# Patient Record
Sex: Female | Born: 1970 | Race: White | Hispanic: No | Marital: Single | Smoking: Never smoker
Health system: Southern US, Community
[De-identification: ages and names within clinical notes are randomized; demographics above are authoritative.]

## PROBLEM LIST (undated history)

## (undated) DIAGNOSIS — T783XXA Angioneurotic edema, initial encounter: Secondary | ICD-10-CM

## (undated) DIAGNOSIS — J45909 Unspecified asthma, uncomplicated: Secondary | ICD-10-CM

## (undated) DIAGNOSIS — I10 Essential (primary) hypertension: Secondary | ICD-10-CM

## (undated) HISTORY — DX: Unspecified asthma, uncomplicated: J45.909

## (undated) HISTORY — DX: Angioneurotic edema, initial encounter: T78.3XXA

## (undated) HISTORY — PX: NO PAST SURGERIES: SHX2092

---

## 2015-01-09 ENCOUNTER — Emergency Department (HOSPITAL_BASED_OUTPATIENT_CLINIC_OR_DEPARTMENT_OTHER): Payer: BLUE CROSS/BLUE SHIELD

## 2015-01-09 ENCOUNTER — Emergency Department (HOSPITAL_BASED_OUTPATIENT_CLINIC_OR_DEPARTMENT_OTHER)
Admission: EM | Admit: 2015-01-09 | Discharge: 2015-01-09 | Disposition: A | Discharge status: Home / Self Care | Payer: BLUE CROSS/BLUE SHIELD | Attending: Emergency Medicine | Admitting: Emergency Medicine

## 2015-01-09 ENCOUNTER — Encounter (HOSPITAL_BASED_OUTPATIENT_CLINIC_OR_DEPARTMENT_OTHER): Payer: Self-pay | Admitting: Emergency Medicine

## 2015-01-09 DIAGNOSIS — R000 Tachycardia, unspecified: Secondary | ICD-10-CM | POA: Diagnosis not present

## 2015-01-09 DIAGNOSIS — N12 Tubulo-interstitial nephritis, not specified as acute or chronic: Secondary | ICD-10-CM | POA: Insufficient documentation

## 2015-01-09 DIAGNOSIS — Z79899 Other long term (current) drug therapy: Secondary | ICD-10-CM | POA: Insufficient documentation

## 2015-01-09 DIAGNOSIS — D649 Anemia, unspecified: Secondary | ICD-10-CM | POA: Insufficient documentation

## 2015-01-09 DIAGNOSIS — I10 Essential (primary) hypertension: Secondary | ICD-10-CM | POA: Insufficient documentation

## 2015-01-09 DIAGNOSIS — R109 Unspecified abdominal pain: Secondary | ICD-10-CM

## 2015-01-09 DIAGNOSIS — R1084 Generalized abdominal pain: Secondary | ICD-10-CM | POA: Diagnosis present

## 2015-01-09 HISTORY — DX: Essential (primary) hypertension: I10

## 2015-01-09 LAB — COMPREHENSIVE METABOLIC PANEL
ALT: 12 U/L — ABNORMAL LOW (ref 14–54)
AST: 17 U/L (ref 15–41)
Albumin: 3.6 g/dL (ref 3.5–5.0)
Alkaline Phosphatase: 61 U/L (ref 38–126)
Anion gap: 10 (ref 5–15)
BUN: 11 mg/dL (ref 6–20)
CO2: 24 mmol/L (ref 22–32)
Calcium: 8.8 mg/dL — ABNORMAL LOW (ref 8.9–10.3)
Chloride: 102 mmol/L (ref 101–111)
Creatinine, Ser: 0.8 mg/dL (ref 0.44–1.00)
GFR calc Af Amer: >60 mL/min (ref >60)
GFR calc non Af Amer: >60 mL/min (ref >60)
Glucose, Bld: 132 mg/dL — ABNORMAL HIGH (ref 65–99)
Potassium: 3.6 mmol/L (ref 3.5–5.1)
Sodium: 136 mmol/L (ref 135–145)
Total Bilirubin: 0.4 mg/dL (ref 0.3–1.2)
Total Protein: 6.8 g/dL (ref 6.5–8.1)

## 2015-01-09 LAB — DIFFERENTIAL
Basophils Absolute: 0 10*3/uL (ref 0.0–0.1)
Basophils Relative: 0 % (ref 0–1)
Eosinophils Absolute: 0 10*3/uL (ref 0.0–0.7)
Eosinophils Relative: 0 % (ref 0–5)
Lymphocytes Relative: 6 % — ABNORMAL LOW (ref 12–46)
Lymphs Abs: 1.4 10*3/uL (ref 0.7–4.0)
Monocytes Absolute: 2.7 10*3/uL — ABNORMAL HIGH (ref 0.1–1.0)
Monocytes Relative: 12 % (ref 3–12)
Neutro Abs: 19.3 10*3/uL — ABNORMAL HIGH (ref 1.7–7.7)
Neutrophils Relative %: 82 % — ABNORMAL HIGH (ref 43–77)

## 2015-01-09 LAB — CBC
HCT: 35.3 % — ABNORMAL LOW (ref 36.0–46.0)
Hemoglobin: 11.3 g/dL — ABNORMAL LOW (ref 12.0–15.0)
MCH: 26.8 pg (ref 26.0–34.0)
MCHC: 32 g/dL (ref 30.0–36.0)
MCV: 83.8 fL (ref 78.0–100.0)
Platelets: 314 10*3/uL (ref 150–400)
RBC: 4.21 MIL/uL (ref 3.87–5.11)
RDW: 14.6 % (ref 11.5–15.5)
WBC: 23.5 10*3/uL — ABNORMAL HIGH (ref 4.0–10.5)

## 2015-01-09 LAB — URINALYSIS, ROUTINE W REFLEX MICROSCOPIC
Bilirubin Urine: NEGATIVE
Glucose, UA: NEGATIVE mg/dL
Ketones, ur: 15 mg/dL — AB
Nitrite: NEGATIVE
Protein, ur: 30 mg/dL — AB
Specific Gravity, Urine: 1.016 (ref 1.005–1.030)
Urobilinogen, UA: 0.2 mg/dL (ref 0.0–1.0)
pH: 6.5 (ref 5.0–8.0)

## 2015-01-09 LAB — I-STAT CG4 LACTIC ACID, ED
Lactic Acid, Venous: 0.81 mmol/L (ref 0.5–2.0)
Lactic Acid, Venous: 0.92 mmol/L (ref 0.5–2.0)

## 2015-01-09 LAB — URINE MICROSCOPIC-ADD ON

## 2015-01-09 LAB — PREGNANCY, URINE: Preg Test, Ur: NEGATIVE

## 2015-01-09 LAB — LIPASE, BLOOD: Lipase: 17 U/L — ABNORMAL LOW (ref 22–51)

## 2015-01-09 MED ORDER — SODIUM CHLORIDE 0.9 % IV SOLN
1000.0000 mL | Freq: Once | INTRAVENOUS | Status: AC
  Administered 2015-01-09: 1000 mL via INTRAVENOUS

## 2015-01-09 MED ORDER — CEPHALEXIN 500 MG PO CAPS: 500.0000 mg | ORAL_CAPSULE | Freq: Four times a day (QID) | ORAL | Status: DC

## 2015-01-09 MED ORDER — IOHEXOL 300 MG/ML  SOLN
100.0000 mL | Freq: Once | INTRAMUSCULAR | Status: AC | PRN
  Administered 2015-01-09: 100 mL via INTRAVENOUS

## 2015-01-09 MED ORDER — SODIUM CHLORIDE 0.9 % IV SOLN
1000.0000 mL | INTRAVENOUS | Status: DC
  Administered 2015-01-09: 1000 mL via INTRAVENOUS

## 2015-01-09 MED ORDER — FLUCONAZOLE 150 MG PO TABS: 150.0000 mg | ORAL_TABLET | Freq: Once | ORAL | Status: DC

## 2015-01-09 MED ORDER — ONDANSETRON HCL 4 MG PO TABS: 4.0000 mg | ORAL_TABLET | Freq: Four times a day (QID) | ORAL | Status: DC | PRN

## 2015-01-09 MED ORDER — ONDANSETRON HCL 4 MG/2ML IJ SOLN
4.0000 mg | Freq: Once | INTRAMUSCULAR | Status: AC
  Administered 2015-01-09: 4 mg via INTRAVENOUS
  Filled 2015-01-09: qty 2

## 2015-01-09 MED ORDER — CEFTRIAXONE SODIUM 1 G IJ SOLR
INTRAMUSCULAR | Status: AC
  Filled 2015-01-09: qty 10

## 2015-01-09 MED ORDER — ACETAMINOPHEN 325 MG PO TABS
650.0000 mg | ORAL_TABLET | Freq: Once | ORAL | Status: AC
  Administered 2015-01-09: 650 mg via ORAL
  Filled 2015-01-09: qty 2

## 2015-01-09 MED ORDER — CEFTRIAXONE SODIUM 1 G IJ SOLR
1.0000 g | Freq: Once | INTRAMUSCULAR | Status: AC
  Administered 2015-01-09: 1 g via INTRAVENOUS

## 2015-01-09 MED ORDER — IOHEXOL 300 MG/ML  SOLN
50.0000 mL | Freq: Once | INTRAMUSCULAR | Status: AC | PRN
  Administered 2015-01-09: 50 mL via ORAL

## 2015-01-09 MED ORDER — HYDROMORPHONE HCL 1 MG/ML IJ SOLN
1.0000 mg | Freq: Once | INTRAMUSCULAR | Status: AC
  Administered 2015-01-09: 1 mg via INTRAVENOUS
  Filled 2015-01-09: qty 1

## 2015-01-09 NOTE — ED Notes (Signed)
MD at bedside to discuss results of testing and plan of care. 

## 2015-01-09 NOTE — ED Notes (Signed)
Pt states that 5 days ago her pain began to worse from normal back pain, now with lower abdomen, increased back pain, fever chills

## 2015-01-09 NOTE — ED Provider Notes (Signed)
CSN: [id number]     Arrival date & time 01/09/15  0505 History   First MD Initiated Contact with Patient 01/09/15 0531     Chief Complaint  Patient presents with  . Abdominal Pain     (Consider location/radiation/quality/duration/timing/severity/associated sxs/prior Treatment) Patient is a 44 y.o. female presenting with abdominal pain. The history is provided by the patient.  Abdominal Pain She [facility] and thought she had a UTI and treated herself with over-the-counter phenazopyridine with improvement of symptoms. About one week ago, she started having some abdominal bloating which has gotten worse. Her back pain got significantly worse tonight and she continues to have abdominal pain. Abdominal pain is generalized. There is associated nausea but no vomiting. She thought she was constipated and took a laxity of. Following this, she did have a bowel movement with no relief of her pain. She rates her pain at 9/10. She has had chills but no fever or sweats. She is also complaining of generalized body aches. She denies urinary urgency, frequency, tenesmus, dysuria. She is also complaining of a headache.  Past Medical History  Diagnosis Date  . Hypertension    History reviewed. No pertinent past surgical history. History reviewed. No pertinent family history. History  Substance Use Topics  . Smoking status: Never Smoker   . Smokeless tobacco: Not on file  . Alcohol Use: No   OB History    No data available     Review of Systems  Gastrointestinal: Positive for abdominal pain.  All other systems reviewed and are negative.     Allergies  Erythromycin and Sulfa antibiotics  Home Medications   Prior to Admission medications   Medication Sig Start Date End Date Taking? Authorizing Provider  diltiazem (TIAZAC) 240 MG 24 hr capsule Take 240 mg by mouth daily.   Yes Historical Provider, MD  furosemide (LASIX) 20 MG tablet Take 20 mg by mouth.   Yes Historical Provider, MD   HYDROcodone-acetaminophen (NORCO/VICODIN) 5-325 MG per tablet Take 3 tablets by mouth daily.   Yes Historical Provider, MD  levothyroxine (SYNTHROID, LEVOTHROID) 100 MCG tablet Take 100 mcg by mouth daily before breakfast.   Yes Historical Provider, MD  metoprolol tartrate (LOPRESSOR) 25 MG tablet Take 25 mg by mouth 2 (two) times daily.   Yes Historical Provider, MD  naloxegol oxalate (MOVANTIK) 25 MG TABS tablet Take by mouth daily.   Yes Historical Provider, MD   BP 131/85 mmHg  Pulse 124  Temp(Src) 101.7 F (38.7 C) (Oral)  Resp 20  Ht  (1.651 m)  Wt 155 lb (70.308 kg)  BMI 25.79 kg/m2  SpO2 100%  LMP 01/05/2015 Physical Exam  Nursing note and vitals reviewed.  44 year old female, resting comfortably and in no acute distress. Vital signs are significant for fever and tachycardia. Oxygen saturation is 100%, which is normal. Head is normocephalic and atraumatic. PERRLA, EOMI. Oropharynx is clear. Neck is nontender and supple without adenopathy or JVD. Back is nontender in the midline. There is moderate left CVA tenderness. Lungs are clear without rales, wheezes, or rhonchi. Chest is nontender. Heart has regular rate and rhythm without murmur. Abdomen is soft, flat, with mild tenderness diffusely. There are no masses or hepatosplenomegaly and peristalsis is slightly hypoactive. Extremities have no cyanosis or edema, full range of motion is present. Skin is warm and dry without rash. Neurologic: Mental status is normal, cranial nerves are intact, there are no motor or sensory deficits.  ED Course  Procedures (including critical  care time) Labs Review Results for orders placed or performed during the hospital encounter of 01/09/15  Urinalysis, Routine w reflex microscopic (not at [facility])  Result Value Ref Range   Color, Urine RED (A) YELLOW   APPearance CLOUDY (A) CLEAR   Specific Gravity, Urine 1.016 1.005 - 1.030   pH 6.5 5.0 - 8.0   Glucose, UA NEGATIVE NEGATIVE mg/dL    Hgb urine dipstick LARGE (A) NEGATIVE   Bilirubin Urine NEGATIVE NEGATIVE   Ketones, ur 15 (A) NEGATIVE mg/dL   Protein, ur 30 (A) NEGATIVE mg/dL   Urobilinogen, UA 0.2 0.0 - 1.0 mg/dL   Nitrite NEGATIVE NEGATIVE   Leukocytes, UA MODERATE (A) NEGATIVE  Comprehensive metabolic panel  Result Value Ref Range   Sodium 136 135 - 145 mmol/L   Potassium 3.6 3.5 - 5.1 mmol/L   Chloride 102 101 - 111 mmol/L   CO2 24 22 - 32 mmol/L   Glucose, Bld 132 (H) 65 - 99 mg/dL   BUN 11 6 - 20 mg/dL   Creatinine, Ser 1.61 0.44 - 1.00 mg/dL   Calcium 8.8 (L) 8.9 - 10.3 mg/dL   Total Protein 6.8 6.5 - 8.1 g/dL   Albumin 3.6 3.5 - 5.0 g/dL   AST 17 15 - 41 U/L   ALT 12 (L) 14 - 54 U/L   Alkaline Phosphatase 61 38 - 126 U/L   Total Bilirubin 0.4 0.3 - 1.2 mg/dL   GFR calc non Af Amer >60 >60 mL/min   GFR calc Af Amer >60 >60 mL/min   Anion gap 10 5 - 15  CBC  Result Value Ref Range   WBC 23.5 (H) 4.0 - 10.5 K/uL   RBC 4.21 3.87 - 5.11 MIL/uL   Hemoglobin 11.3 (L) 12.0 - 15.0 g/dL   HCT 09.6 (L) 04.5 - 40.9 %   MCV 83.8 78.0 - 100.0 fL   MCH 26.8 26.0 - 34.0 pg   MCHC 32.0 30.0 - 36.0 g/dL   RDW 81.1 91.4 - 78.2 %   Platelets 314 150 - 400 K/uL  Differential  Result Value Ref Range   Neutrophils Relative % 82 (H) 43 - 77 %   Neutro Abs 19.3 (H) 1.7 - 7.7 K/uL   Lymphocytes Relative 6 (L) 12 - 46 %   Lymphs Abs 1.4 0.7 - 4.0 K/uL   Monocytes Relative 12 3 - 12 %   Monocytes Absolute 2.7 (H) 0.1 - 1.0 K/uL   Eosinophils Relative 0 0 - 5 %   Eosinophils Absolute 0.0 0.0 - 0.7 K/uL   Basophils Relative 0 0 - 1 %   Basophils Absolute 0.0 0.0 - 0.1 K/uL  Urine microscopic-add on  Result Value Ref Range   Squamous Epithelial / LPF RARE RARE   WBC, UA 21-50 <3 WBC/hpf   RBC / HPF TOO NUMEROUS TO COUNT <3 RBC/hpf   Bacteria, UA RARE RARE  Lipase, blood  Result Value Ref Range   Lipase 17 (L) 22 - 51 U/L  Pregnancy, urine  Result Value Ref Range   Preg Test, Ur NEGATIVE NEGATIVE  I-Stat  CG4 Lactic Acid, ED  Result Value Ref Range   Lactic Acid, Venous 0.81 0.5 - 2.0 mmol/L  I-Stat CG4 Lactic Acid, ED  Result Value Ref Range   Lactic Acid, Venous 0.92 0.5 - 2.0 mmol/L   Imaging Review Ct Abdomen Pelvis W Contrast  01/09/2015   CLINICAL DATA:  Lower abdominal pain for 5 days. Fever and chills. Back  pain.  EXAM: CT ABDOMEN AND PELVIS WITH CONTRAST  TECHNIQUE: Multidetector CT imaging of the abdomen and pelvis was performed using the standard protocol following bolus administration of intravenous contrast.  CONTRAST:  50mL OMNIPAQUE IOHEXOL 300 MG/ML SOLN, OMNIPAQUE IOHEXOL 300 MG/ML SOLN  COMPARISON:  None.  FINDINGS: Lower chest:  The included lung bases are clear.  Liver: No focal lesion.  Hepatobiliary: Gallbladder physiologically distended. No biliary dilatation.  Pancreas: Normal.  Spleen: Normal.  Adrenal glands: No nodule.  Kidneys: Enlargement and heterogeneous enhancement of the left kidney with proximal ureteral enhancement. Moderate perinephric stranding. No obstructing stone. Heterogeneous density in the left upper pole. Homogeneous enhancement of the right kidney. No focal lesion.  Stomach/Bowel: Stomach physiologically distended. There are no dilated or thickened small bowel loops. Moderate volume of stool throughout the colon without colonic wall thickening. The appendix is normal.  Vascular/Lymphatic: No retroperitoneal adenopathy. Abdominal aorta is normal in caliber.  Reproductive: Uterus is normal for age. Ovaries not discretely visualized. Small amount of free fluid in the cul-de-sac.  Bladder: Physiologically distended.  Other: No free air or intra-abdominal fluid collection. Small fat containing umbilical hernia.  Musculoskeletal: There are no acute or suspicious osseous abnormalities.  IMPRESSION: 1. Left pyelonephritis. 2. Small amount of free fluid in the pelvis, likely reactive.   Electronically Signed   By: Rubye Oaks M.D.   On: 01/09/2015 07:06    Images viewed by me.  MDM   Final diagnoses:  Abdominal pain, unspecified abdominal location  Pyelonephritis  Normochromic normocytic anemia    Fever, bodyaches, tachycardia with associated abdominal pain. This may represent a urinary tract infection with right is. However, with generalized abdominal pain and bloating, will send for CT to rule out other pathology. In the meantime, she is given IV fluids and hydromorphone for pain and ondansetron for nausea. She has no past records and the [facility] system.  WBC is come back markedly elevated at 23.5. Lactic acid level is normal. CT shows evidence of pyelonephritis. Curiously, urine shows mainly hematuria with relatively small number of WBCs and bacteria. Urine is sent for culture and she is started on ceftriaxone. Because of severe leukocytosis, admission was recommended. However, patient stated she would prefer to try treatment at home. She is not overtly toxic and not hypotensive and not vomiting, so initial outpatient management is not unreasonable. She is discharged with prescriptions for cephalexin and ondansetron but advised to return to the ED should she not be responding reasonably well at home.  Dione Booze, MD 01/09/15 [phone]

## 2015-01-09 NOTE — ED Notes (Signed)
MD back at bedside to discuss discharge versus admit with pt.

## 2015-01-09 NOTE — ED Notes (Signed)
Patient transported to CT 

## 2015-01-09 NOTE — Discharge Instructions (Signed)
Return to emergency department if you're having any difficulties. Specifically, if you start running a high fever, have vomiting which is not controlled with ondansetron, or pain is not being adequately controlled at home. You do have a serious kidney infection which sometimes needs treatment in the hospital.  Pyelonephritis, Adult Pyelonephritis is a kidney infection. In general, there are 2 main types of pyelonephritis:  Infections that come on quickly without any warning (acute pyelonephritis).  Infections that persist for a long period of time (chronic pyelonephritis). CAUSES  Two main causes of pyelonephritis are:  Bacteria traveling from the bladder to the kidney. This is a problem especially in pregnant women. The urine in the bladder can become filled with bacteria from multiple causes, including:  Inflammation of the prostate gland (prostatitis).  Sexual intercourse in females.  Bladder infection (cystitis).  Bacteria traveling from the bloodstream to the tissue part of the kidney. Problems that may increase your risk of getting a kidney infection include:  Diabetes.  Kidney stones or bladder stones.  Cancer.  Catheters placed in the bladder.  Other abnormalities of the kidney or ureter. SYMPTOMS   Abdominal pain.  Pain in the side or flank area.  Fever.  Chills.  Upset stomach.  Blood in the urine (dark urine).  Frequent urination.  Strong or persistent urge to urinate.  Burning or stinging when urinating. DIAGNOSIS  Your caregiver may diagnose your kidney infection based on your symptoms. A urine sample may also be taken. TREATMENT  In general, treatment depends on how severe the infection is.   If the infection is mild and caught early, your caregiver may treat you with oral antibiotics and send you home.  If the infection is more severe, the bacteria may have gotten into the bloodstream. This will require intravenous (IV) antibiotics and a  hospital stay. Symptoms may include:  High fever.  Severe flank pain.  Shaking chills.  Even after a hospital stay, your caregiver may require you to be on oral antibiotics for a period of time.  Other treatments may be required depending upon the cause of the infection. HOME CARE INSTRUCTIONS   Take your antibiotics as directed. Finish them even if you start to feel better.  Make an appointment to have your urine checked to make sure the infection is gone.  Drink enough fluids to keep your urine clear or pale yellow.  Take medicines for the bladder if you have urgency and frequency of urination as directed by your caregiver. SEEK IMMEDIATE MEDICAL CARE IF:   You have a fever or persistent symptoms for more than 2-3 days.  You have a fever and your symptoms suddenly get worse.  You are unable to take your antibiotics or fluids.  You develop shaking chills.  You experience extreme weakness or fainting.  There is no improvement after 2 days of treatment. MAKE SURE YOU:  Understand these instructions.  Will watch your condition.  Will get help right away if you are not doing well or get worse. Document Released: 05/29/2005 Document Revised: 11/28/2011 Document Reviewed: 11/02/2010 [facility] Patient Information 2015 [place], [place]. This information is not intended to replace advice given to you by your health care provider. Make sure you discuss any questions you have with your health care provider.  Cephalexin tablets or capsules What is this medicine? CEPHALEXIN (sef a LEX in) is a cephalosporin antibiotic. It is used to treat certain kinds of bacterial infections It will not work for colds, flu, or other viral infections.  This medicine may be used for other purposes; ask your health care provider or pharmacist if you have questions. COMMON BRAND NAME(S): Biocef, Keflex, Keftab What should I tell my health care provider before I take this medicine? They need to know if  you have any of these conditions: -kidney disease -stomach or intestine problems, especially colitis -an unusual or allergic reaction to cephalexin, other cephalosporins, penicillins, other antibiotics, medicines, foods, dyes or preservatives -pregnant or trying to get pregnant -breast-feeding How should I use this medicine? Take this medicine by mouth with a full glass of water. Follow the directions on the prescription label. This medicine can be taken with or without food. Take your medicine at regular intervals. Do not take your medicine more often than directed. Take all of your medicine as directed even if you think you are better. Do not skip doses or stop your medicine early. Talk to your pediatrician regarding the use of this medicine in children. While this drug may be prescribed for selected conditions, precautions do apply. Overdosage: If you think you have taken too much of this medicine contact a poison control center or emergency room at once. NOTE: This medicine is only for you. Do not share this medicine with others. What if I miss a dose? If you miss a dose, take it as soon as you can. If it is almost time for your next dose, take only that dose. Do not take double or extra doses. There should be at least 4 to 6 hours between doses. What may interact with this medicine? -probenecid -some other antibiotics This list may not describe all possible interactions. Give your health care provider a list of all the medicines, herbs, non-prescription drugs, or dietary supplements you use. Also tell them if you smoke, drink alcohol, or use illegal drugs. Some items may interact with your medicine. What should I watch for while using this medicine? Tell your doctor or health care professional if your symptoms do not begin to improve in a few days. Do not treat diarrhea with over the counter products. Contact your doctor if you have diarrhea that lasts more than 2 days or if it is severe and  watery. If you have diabetes, you may get a false-positive result for sugar in your urine. Check with your doctor or health care professional. What side effects may I notice from receiving this medicine? Side effects that you should report to your doctor or health care professional as soon as possible: -allergic reactions like skin rash, itching or hives, swelling of the face, lips, or tongue -breathing problems -pain or trouble passing urine -redness, blistering, peeling or loosening of the skin, including inside the mouth -severe or watery diarrhea -unusually weak or tired -yellowing of the eyes, skin Side effects that usually do not require medical attention (report to your doctor or health care professional if they continue or are bothersome): -gas or heartburn -genital or anal irritation -headache -joint or muscle pain -nausea, vomiting This list may not describe all possible side effects. Call your doctor for medical advice about side effects. You may report side effects to FDA at 1-800-FDA-1088. Where should I keep my medicine? Keep out of the reach of children. Store at room temperature between 59 and 86 degrees F (15 and 30 degrees C). Throw away any unused medicine after the expiration date. NOTE: This sheet is a summary. It may not cover all possible information. If you have questions about this medicine, talk to your doctor, pharmacist, or  health care provider.  2015, Elsevier/Gold Standard. (2007-09-02 17:09:13)  Ondansetron tablets What is this medicine? ONDANSETRON (on DAN se tron) is used to treat nausea and vomiting caused by chemotherapy. It is also used to prevent or treat nausea and vomiting after surgery. This medicine may be used for other purposes; ask your health care provider or pharmacist if you have questions. COMMON BRAND NAME(S): Zofran What should I tell my health care provider before I take this medicine? They need to know if you have any of these  conditions: -heart disease -history of irregular heartbeat -liver disease -low levels of magnesium or potassium in the blood -an unusual or allergic reaction to ondansetron, granisetron, other medicines, foods, dyes, or preservatives -pregnant or trying to get pregnant -breast-feeding How should I use this medicine? Take this medicine by mouth with a glass of water. Follow the directions on your prescription label. Take your doses at regular intervals. Do not take your medicine more often than directed. Talk to your pediatrician regarding the use of this medicine in children. Special care may be needed. Overdosage: If you think you have taken too much of this medicine contact a poison control center or emergency room at once. NOTE: This medicine is only for you. Do not share this medicine with others. What if I miss a dose? If you miss a dose, take it as soon as you can. If it is almost time for your next dose, take only that dose. Do not take double or extra doses. What may interact with this medicine? Do not take this medicine with any of the following medications: -apomorphine -certain medicines for fungal infections like fluconazole, itraconazole, ketoconazole, posaconazole, voriconazole -cisapride -dofetilide -dronedarone -pimozide -thioridazine -ziprasidone This medicine may also interact with the following medications: -carbamazepine -certain medicines for depression, anxiety, or psychotic disturbances -fentanyl -linezolid -MAOIs like Carbex, Eldepryl, Marplan, Nardil, and Parnate -methylene blue (injected into a vein) -other medicines that prolong the QT interval (cause an abnormal heart rhythm) -phenytoin -rifampicin -tramadol This list may not describe all possible interactions. Give your health care provider a list of all the medicines, herbs, non-prescription drugs, or dietary supplements you use. Also tell them if you smoke, drink alcohol, or use illegal drugs. Some  items may interact with your medicine. What should I watch for while using this medicine? Check with your doctor or health care professional right away if you have any sign of an allergic reaction. What side effects may I notice from receiving this medicine? Side effects that you should report to your doctor or health care professional as soon as possible: -allergic reactions like skin rash, itching or hives, swelling of the face, lips or tongue -breathing problems -confusion -dizziness -fast or irregular heartbeat -feeling faint or lightheaded, falls -fever and chills -loss of balance or coordination -seizures -sweating -swelling of the hands or feet -tightness in the chest -tremors -unusually weak or tired Side effects that usually do not require medical attention (report to your doctor or health care professional if they continue or are bothersome): -constipation or diarrhea -headache This list may not describe all possible side effects. Call your doctor for medical advice about side effects. You may report side effects to FDA at 1-800-FDA-1088. Where should I keep my medicine? Keep out of the reach of children. Store between 2 and 30 degrees C (36 and 86 degrees F). Throw away any unused medicine after the expiration date. NOTE: This sheet is a summary. It may not cover all possible information. If  you have questions about this medicine, talk to your doctor, pharmacist, or health care provider.  2015, Elsevier/Gold Standard. (2013-03-05 16:27:45)

## 2015-01-12 LAB — URINE CULTURE: Culture: >=100000

## 2015-01-13 ENCOUNTER — Telehealth (HOSPITAL_COMMUNITY): Payer: Self-pay

## 2015-01-13 NOTE — Telephone Encounter (Signed)
Post ED Visit - Positive Culture Follow-up  Culture report reviewed by antimicrobial stewardship pharmacist:  Wes Dulaney, Pharm.D., BCPS  Celedonio Miyamoto, Pharm.D., BCPS  Georgina Pillion, [address].D., BCPS  [place], [place].D., BCPS, AAHIVP  Estella Husk, Pharm.D., BCPS, AAHIVP  Elder Cyphers, [address].D., BCPS X  [facility]  Positive urine culture Treated with cephalexin, organism sensitive to the same and no further patient follow-up is required at this time.  Ashley Jacobs 01/13/2015, 8:42 AM

## 2018-08-30 ENCOUNTER — Other Ambulatory Visit: Payer: Self-pay

## 2018-08-30 ENCOUNTER — Ambulatory Visit (INDEPENDENT_AMBULATORY_CARE_PROVIDER_SITE_OTHER): Payer: BLUE CROSS/BLUE SHIELD | Admitting: Allergy

## 2018-08-30 ENCOUNTER — Encounter: Payer: Self-pay | Admitting: Allergy

## 2018-08-30 VITALS — BP 100/62 | HR 92 | Temp 98.4°F | Resp 14 | Ht 64.5 in | Wt 169.0 lb

## 2018-08-30 DIAGNOSIS — J3089 Other allergic rhinitis: Secondary | ICD-10-CM | POA: Diagnosis not present

## 2018-08-30 DIAGNOSIS — J4530 Mild persistent asthma, uncomplicated: Secondary | ICD-10-CM

## 2018-08-30 DIAGNOSIS — J302 Other seasonal allergic rhinitis: Secondary | ICD-10-CM | POA: Insufficient documentation

## 2018-08-30 DIAGNOSIS — T50995D Adverse effect of other drugs, medicaments and biological substances, subsequent encounter: Secondary | ICD-10-CM | POA: Insufficient documentation

## 2018-08-30 DIAGNOSIS — J4540 Moderate persistent asthma, uncomplicated: Secondary | ICD-10-CM | POA: Insufficient documentation

## 2018-08-30 DIAGNOSIS — T50905D Adverse effect of unspecified drugs, medicaments and biological substances, subsequent encounter: Secondary | ICD-10-CM

## 2018-08-30 DIAGNOSIS — T50905A Adverse effect of unspecified drugs, medicaments and biological substances, initial encounter: Secondary | ICD-10-CM | POA: Insufficient documentation

## 2018-08-30 MED ORDER — AZELASTINE-FLUTICASONE 137-50 MCG/ACT NA SUSP: 1.0000 | Freq: Two times a day (BID) | NASAL | 5 refills | Status: DC

## 2018-08-30 MED ORDER — EPINEPHRINE 0.3 MG/0.3ML IJ SOAJ: 0.3000 mg | INTRAMUSCULAR | 1 refills | Status: DC | PRN

## 2018-08-30 MED ORDER — BUDESONIDE-FORMOTEROL FUMARATE 80-4.5 MCG/ACT IN AERO: 2.0000 | INHALATION_SPRAY | Freq: Two times a day (BID) | RESPIRATORY_TRACT | 5 refills | Status: DC

## 2018-08-30 NOTE — Assessment & Plan Note (Signed)
Diagnosed with asthma over 10 years ago. Currently having a flare and using Qvar and albuterol daily with minimal benefit. Lungs clear on exam. . Daily controller medication(s): Start Symbicort 80 2 puffs twice a day with spacer and rinse mouth afterwards for the next month. Demonstrated proper use.  . Prior to physical activity: May use albuterol rescue inhaler 2 puffs 5 to 15 minutes prior to strenuous physical activities. Marland Kitchen Rescue medications: May use albuterol rescue inhaler 2 puffs or nebulizer every 4 to 6 hours as needed for shortness of breath, chest tightness, coughing, and wheezing. Monitor frequency of use.  . During upper respiratory infections: Start Symbicort 80 2 puffs twice a day with spacer and rinse mouth afterwards for 1-2 weeks. . Get spirometry at next visit.

## 2018-08-30 NOTE — Patient Instructions (Addendum)
Get your allergy injection this week and your vials from [place] and bring it to our office. I will review your records and order the allergy shots accordingly.  Had a detailed discussion with patient/family that clinical history is suggestive of allergic rhinitis, and may benefit from allergy immunotherapy (AIT). Discussed in detail regarding the dosing, schedule, side effects (mild to moderate local allergic reaction and rarely systemic allergic reactions including anaphylaxis), and benefits (significant improvement in nasal symptoms, seasonal flares of asthma) of immunotherapy with the patient. There is significant time commitment involved with allergy shots and we will have to rebuild weekly for a few months.   Continue environmental control measures for trees, weed, dust mites, dog and ragweed.   Start Dymista 1 spray twice a day. This replaces all your nasal sprays. Let me know if it's too expensive. Sample given.  Nasal saline spray (i.e., Simply Saline) or nasal saline lavage (i.e., NeilMed) is recommended as needed and prior to medicated nasal sprays.  Continue Singulair 10mg  daily. May use over the counter antihistamines such as Zyrtec (cetirizine), Claritin (loratadine), Allegra (fexofenadine), or Xyzal (levocetirizine) daily as needed.  Asthma: . Daily controller medication(s): Start Symbicort 80 2 puffs twice a day with spacer and rinse mouth afterwards for the next month.  . Prior to physical activity: May use albuterol rescue inhaler 2 puffs 5 to 15 minutes prior to strenuous physical activities. Marland Kitchen Rescue medications: May use albuterol rescue inhaler 2 puffs or nebulizer every 4 to 6 hours as needed for shortness of breath, chest tightness, coughing, and wheezing. Monitor frequency of use.  . During upper respiratory infections: Start Symbicort 80 2 puffs twice a day with spacer and rinse mouth afterwards for 1-2 weeks.  . Asthma control goals:  o Full participation in all desired  activities (may need albuterol before activity) o Albuterol use two times or less a week on average (not counting use with activity) o Cough interfering with sleep two times or less a month o Oral steroids no more than once a year o No hospitalizations  Continue to avoid drugs that bother you - Avelox, sulfa and erythromycin  Follow up in 2 months.   Reducing Pollen Exposure . Pollen seasons: trees (spring), grass (summer) and ragweed/weeds (fall). Marland Kitchen Keep windows closed in your home and car to lower pollen exposure.  Lilian Kapur air conditioning in the bedroom and throughout the house if possible.  . Avoid going out in dry windy days - especially early morning. . Pollen counts are highest between 5 - 10 AM and on dry, hot and windy days.  . Save outside activities for late afternoon or after a heavy rain, when pollen levels are lower.  . Avoid mowing of grass if you have grass pollen allergy. Marland Kitchen Be aware that pollen can also be transported indoors on people and pets.  . Dry your clothes in an automatic dryer rather than hanging them outside where they might collect pollen.  . Rinse hair and eyes before bedtime. Control of House Dust Mite Allergen . Dust mite allergens are a common trigger of allergy and asthma symptoms. While they can be found throughout the house, these microscopic creatures thrive in warm, humid environments such as bedding, upholstered furniture and carpeting. . Because so much time is spent in the bedroom, it is essential to reduce mite levels there.  . Encase pillows, mattresses, and box springs in special allergen-proof fabric covers or airtight, zippered plastic covers.  Cephus Shelling should be washed weekly  in hot water (130 F) and dried in a hot dryer. Allergen-proof covers are available for comforters and pillows that can't be regularly washed.  Reyes Ivan the allergy-proof covers every few months. Minimize clutter in the bedroom. Keep pets out of the bedroom.  Marland Kitchen Keep  humidity less than 50% by using a dehumidifier or air conditioning. You can buy a humidity measuring device called a hygrometer to monitor this.  . If possible, replace carpets with hardwood, linoleum, or washable area rugs. If that's not possible, vacuum frequently with a vacuum that has a HEPA filter. . Remove all upholstered furniture and non-washable window drapes from the bedroom. . Remove all non-washable stuffed toys from the bedroom.  Wash stuffed toys weekly. Pet Allergen Avoidance: . Contrary to popular opinion, there are no "hypoallergenic" breeds of dogs or cats. That is because people are not allergic to an animal's hair, but to an allergen found in the animal's saliva, dander (dead skin flakes) or urine. Pet allergy symptoms typically occur within minutes. For some people, symptoms can build up and become most severe 8 to 12 hours after contact with the animal. People with severe allergies can experience reactions in public places if dander has been transported on the pet owners' clothing. Marland Kitchen Keeping an animal outdoors is only a partial solution, since homes with pets in the yard still have higher concentrations of animal allergens. . Before getting a pet, ask your allergist to determine if you are allergic to animals. If your pet is already considered part of your family, try to minimize contact and keep the pet out of the bedroom and other rooms where you spend a great deal of time. . As with dust mites, vacuum carpets often or replace carpet with a hardwood floor, tile or linoleum. . High-efficiency particulate air (HEPA) cleaners can reduce allergen levels over time. . While dander and saliva are the source of cat and dog allergens, urine is the source of allergens from rabbits, hamsters, mice and [place] pigs; so ask a non-allergic family member to clean the animal's cage. . If you have a pet allergy, talk to your allergist about the potential for allergy immunotherapy (allergy shots).  This strategy can often provide long-term relief.

## 2018-08-30 NOTE — Progress Notes (Signed)
New Patient Note  RE: Bonnie Lowe MRN: [id number] DOB: 04/03/1971 Date of Office Visit: 08/30/2018  Referring provider: Evangeline GulaPlunk, Tiffany Quinn, F* Primary care provider: Hezzie BumpPlunk, Tiffany Quinn, Lowe  Chief Complaint: Allergies (transferring vials from [place] allergist. she is on maint already. She does have some post nasal drainage which has caused her to have a bit of a cough) and Asthma (she does have asthma, it is normally pretty well controlled. )  History of Present Illness: I had the pleasure of seeing Bonnie Lowe for initial evaluation at the [facility] on 08/30/2018. She is a 48 y.o. female, who is self-referred here for the evaluation of re-establishing care of the allergist.   Patient currently sees an allergist in [place] (Bonnie Lowe) and is on allergy immunotherapy for 10 years with no reactions in the past. Currently on AIT every 2-3 weeks and last injection was on 08/19/2018. [place] and it would be more convenient for her to receive injections in [place].   She reports symptoms of frequent sinus infections, PND, itchy/watery eyes which significantly improved with allergy shots. Symptoms have been going on for 40+ years. The symptoms are present all year around with worsening in December. Other triggers include exposure to dust. Anosmia: no. She has used Singulair, ipratropium 0.03% and Flonase and with some improvement in symptoms. Tried Astelin in the past but stopped due to bad taste. Sinus infections: once a year. Previous work up includes: skin testing in 2109 was positive to multiple items. Currently on AIT - trees, weed, dust mites, dog and ragweed. She had new vials made in July 2019. Previous ENT evaluation: not recently.  Asthma: She reports symptoms of chest tightness, shortness of breath, coughing, wheezing for 10 years and now having a flare due to dust exposure. Current medications include Qvar and albuterol prn which helps. She  tried the following inhalers: Advair. Main asthma triggers are allergies, infections. In the last month, frequency of asthma symptoms: last 2 weeks has been using it daily. Frequency of nocturnal symptoms: 0x/month. Frequency of SABA use: daily the last 2 weeks.  In the last 12 months, emergency room visits/urgent care visits/doctor office visits or hospitalizations due to asthma: 0. In the last 12 months, oral steroids courses: yes for bronchitis. Lifetime history of hospitalization for asthma: no. Prior intubations: no. History of pneumonia: no. She was evaluated by allergist in the past. Smoking exposure: no. Up to date with flu vaccine: yes.   Denies any fevers or chills. She does travel a lot for her work but she has not been traveling much since January. She did go to [place] about 2 weeks ago.    Assessment and Plan: Bonnie BraunKaren is a 48 y.o. female with: Mild persistent asthma without complication Diagnosed with asthma over 10 years ago. Currently having a flare and using Qvar and albuterol daily with minimal benefit. Lungs clear on exam. . Daily controller medication(s): Start Symbicort 80 2 puffs twice a day with spacer and rinse mouth afterwards for the next month. Demonstrated proper use.  . Prior to physical activity: May use albuterol rescue inhaler 2 puffs 5 to 15 minutes prior to strenuous physical activities. Marland Kitchen. Rescue medications: May use albuterol rescue inhaler 2 puffs or nebulizer every 4 to 6 hours as needed for shortness of breath, chest tightness, coughing, and wheezing. Monitor frequency of use.  . During upper respiratory infections: Start Symbicort 80 2 puffs twice a day with spacer and rinse  mouth afterwards for 1-2 weeks. . Get spirometry at next visit.   Seasonal and perennial allergic rhinitis Perennial rhino conjunctivitis symptoms for the past 40+ years but much improved since started AIT 10 years ago. No systemic reactions in the past. 2019 skin testing was positive to multiple  items. New vials made in 2019 - Dm-D-Rw + W-/t  Get your allergy injection this week and your vials from [place] and bring it to our office.  I will review your records and order the allergy shots accordingly.  Had a detailed discussion with patient/family that clinical history is suggestive of allergic rhinitis, and may benefit from allergy immunotherapy (AIT). Discussed in detail regarding the dosing, schedule, side effects (mild to moderate local allergic reaction and rarely systemic allergic reactions including anaphylaxis), and benefits (significant improvement in nasal symptoms, seasonal flares of asthma) of immunotherapy with the patient. There is significant time commitment involved with allergy shots and we will have to rebuild weekly for a few months. Consent was signed.  I have prescribed epinephrine injectable and demonstrated proper use. For mild symptoms you can take over the counter antihistamines such as Benadryl and monitor symptoms closely. If symptoms worsen or if you have severe symptoms including breathing issues, throat closure, significant swelling, whole body hives, severe diarrhea and vomiting, lightheadedness then inject epinephrine and seek immediate medical care afterwards.  Continue environmental control measures for trees, weed, dust mites, dog and ragweed.   Start Dymista 1 spray twice a day. This replaces all your nasal sprays. Let me know if it's too expensive. Sample given.   Nasal saline spray (i.e., Simply Saline) or nasal saline lavage (i.e., NeilMed) is recommended as needed and prior to medicated nasal sprays.  Continue Singulair  daily.  May use over the counter antihistamines such as Zyrtec (cetirizine), Claritin (loratadine), Allegra (fexofenadine), or Xyzal (levocetirizine) daily as needed.  Drug reaction Reactions to Avalox, sulfa and erythromycin in the past in the form of pruritus.  Continue to avoid above medications for now.    Return in about  2 months (around 10/30/2018).  Meds ordered this encounter  Medications  . EPINEPHrine (AUVI-Q) 0.3 mg/0.3 mL IJ SOAJ injection    Sig: Inject 0.3 mLs (0.3 mg total) into the muscle as needed for anaphylaxis.    Dispense:  2 Device    Refill:  1  . Azelastine-Fluticasone (DYMISTA) 137-50 MCG/ACT SUSP    Sig: Place 1 spray into both nostrils 2 (two) times daily.    Dispense:  1 Bottle    Refill:  5    RXBIN [id number] RXPCN [employer] RXGRP [id number] ISSUER [id number] ID [id number]  . budesonide-formoterol (SYMBICORT) 80-4.5 MCG/ACT inhaler    Sig: Inhale 2 puffs into the lungs 2 (two) times daily.    Dispense:  1 Inhaler    Refill:  5    BIN: [id number] PCN: CN GRP# [id number] ID# [id number]   Other allergy screening: Asthma: yes Rhino conjunctivitis: yes Food allergy: no Medication allergy: yes  Avelox - itching Erythromycin - itching Sulfa - itching  Hymenoptera allergy: no Urticaria: no Eczema:no History of recurrent infections suggestive of immunodeficency: no  Diagnostics: None.  Past Medical History: Patient Active Problem List   Diagnosis Date Noted  . Seasonal and perennial allergic rhinitis 08/30/2018  . Mild persistent asthma without complication 08/30/2018  . Drug reaction 08/30/2018   Past Medical History:  Diagnosis Date  . Angio-edema   . Asthma   . Hypertension    Past Surgical History: Past Surgical  History:  Procedure Laterality Date  . NO PAST SURGERIES     Medication List:  Current Outpatient Medications  Medication Sig Dispense Refill  . albuterol (PROVENTIL HFA;VENTOLIN HFA) 108 (90 Base) MCG/ACT inhaler Inhale 2 puffs into the lungs every 4 (four) hours as needed.    . cetirizine (ZYRTEC) 10 MG tablet Take 10 mg by mouth daily.    . Cholecalciferol (QC VITAMIN D3) 25 MCG (1000 UT) tablet Take 1,000 Units by mouth daily.    Marland Kitchen ibuprofen (ADVIL,MOTRIN) 800 MG tablet Take 800 mg by mouth 3 (three) times daily as needed.    Marland Kitchen levothyroxine (SYNTHROID) 75  MCG tablet Take 75 mcg by mouth daily before breakfast.    . liothyronine (CYTOMEL) 5 MCG tablet Take 5 mcg by mouth daily.    Marland Kitchen losartan (COZAAR) 100 MG tablet Take 100 mg by mouth daily.    Marland Kitchen MAGNESIUM GLYCINATE PLUS PO Take 400 mg by mouth daily.    . montelukast (SINGULAIR) 10 MG tablet Take 10 mg by mouth at bedtime.    Marland Kitchen Phentermine-Topiramate (QSYMIA) 7.5-46 MG CP24 Take 1 tablet by mouth daily.    . pimecrolimus (ELIDEL) 1 % cream Apply 1 application topically 2 (two) times daily.    Marland Kitchen spironolactone (ALDACTONE) 25 MG tablet Take 25 mg by mouth daily.    . vitamin k 100 MCG tablet Take 100 mcg by mouth daily.    . Azelastine-Fluticasone (DYMISTA) 137-50 MCG/ACT SUSP Place 1 spray into both nostrils 2 (two) times daily. 1 Bottle 5  . budesonide-formoterol (SYMBICORT) 80-4.5 MCG/ACT inhaler Inhale 2 puffs into the lungs 2 (two) times daily. 1 Inhaler 5  . EPINEPHrine (AUVI-Q) 0.3 mg/0.3 mL IJ SOAJ injection Inject 0.3 mLs (0.3 mg total) into the muscle as needed for anaphylaxis. 2 Device 1   No current facility-administered medications for this visit.    Allergies: Allergies  Allergen Reactions  . Moxifloxacin Hcl Itching  . Citrus Diarrhea  . Erythromycin   . Sulfa Antibiotics    Social History: Social History   Socioeconomic History  . Marital status: Single    Spouse name: Not on file  . Number of children: Not on file  . Years of education: Not on file  . Highest education level: Not on file  Occupational History  . Not on file  Social Needs  . Financial resource strain: Not on file  . Food insecurity:    Worry: Not on file    Inability: Not on file  . Transportation needs:    Medical: Not on file    Non-medical: Not on file  Tobacco Use  . Smoking status: Never Smoker  . Smokeless tobacco: Never Used  Substance and Sexual Activity  . Alcohol use: No  . Drug use: Never  . Sexual activity: Not on file  Lifestyle  . Physical activity:    Days per week: Not  on file    Minutes per session: Not on file  . Stress: Not on file  Relationships  . Social connections:    Talks on phone: Not on file    Gets together: Not on file    Attends religious service: Not on file    Active member of club or organization: Not on file    Attends meetings of clubs or organizations: Not on file    Relationship status: Not on file  Other Topics Concern  . Not on file  Social History Narrative  . Not on file   Lives  in a 51 year old home. Smoking: denies Occupation: Transport planner HistorySurveyor, minerals in the house: no Carpet in the family room: no Carpet in the bedroom: no Heating: electric Cooling: central Pet: no  Family History: Family History  Problem Relation Age of Onset  . Allergic rhinitis Mother   . Angioedema Neg Hx   . Asthma Neg Hx   . Atopy Neg Hx   . Immunodeficiency Neg Hx   . Urticaria Neg Hx   . Eczema Neg Hx    Review of Systems  Constitutional: Negative for appetite change, chills, fever and unexpected weight change.  HENT: Positive for congestion. Negative for rhinorrhea.   Eyes: Negative for itching.  Respiratory: Positive for cough, chest tightness, shortness of breath and wheezing.   Cardiovascular: Negative for chest pain.  Gastrointestinal: Negative for abdominal pain.  Genitourinary: Negative for difficulty urinating.  Skin: Negative for rash.  Allergic/Immunologic: Positive for environmental allergies. Negative for food allergies.  Neurological: Negative for headaches.   Objective: BP 100/62 (BP Location: Left Arm, Patient Position: Sitting, Cuff Size: Normal)   Pulse 92   Temp 98.4 F (36.9 C) (Oral)   Resp 14   Ht 5' 4.5" (1.638 m)   Wt 169 lb (76.7 kg)   SpO2 98%   BMI 28.56 kg/m  Body mass index is 28.56 kg/m. Physical Exam  Constitutional: She is oriented to person, place, and time. She appears well-developed and well-nourished.  HENT:  Head: Normocephalic and atraumatic.  Right  Ear: External ear normal.  Left Ear: External ear normal.  Nose: Nose normal.  Mouth/Throat: Oropharynx is clear and moist.  Eyes: Conjunctivae and EOM are normal.  Neck: Neck supple.  Cardiovascular: Normal rate, regular rhythm and normal heart sounds. Exam reveals no gallop and no friction rub.  No murmur heard. Pulmonary/Chest: Effort normal and breath sounds normal. She has no wheezes. She has no rales.  Abdominal: Soft.  Neurological: She is alert and oriented to person, place, and time.  Skin: Skin is warm. No rash noted.  Psychiatric: She has a normal mood and affect. Her behavior is normal.  Nursing note and vitals reviewed.  The plan was reviewed with the patient/family, and all questions/concerned were addressed.  It was my pleasure to see Celita today and participate in her care. Please feel free to contact me with any questions or concerns.  Sincerely,  Wyline Mood, DO Allergy & Immunology  [facility] office: [phone] [facility] office: [phone]

## 2018-08-30 NOTE — Assessment & Plan Note (Signed)
Reactions to Avalox, sulfa and erythromycin in the past in the form of pruritus.  Continue to avoid above medications for now.

## 2018-08-30 NOTE — Assessment & Plan Note (Addendum)
Perennial rhino conjunctivitis symptoms for the past 40+ years but much improved since started AIT 10 years ago. No systemic reactions in the past. 2019 skin testing was positive to multiple items. New vials made in 2019 - Dm-D-Rw + W-/t  Get your allergy injection this week and your vials from [place] and bring it to our office.  I will review your records and order the allergy shots accordingly.  Had a detailed discussion with patient/family that clinical history is suggestive of allergic rhinitis, and may benefit from allergy immunotherapy (AIT). Discussed in detail regarding the dosing, schedule, side effects (mild to moderate local allergic reaction and rarely systemic allergic reactions including anaphylaxis), and benefits (significant improvement in nasal symptoms, seasonal flares of asthma) of immunotherapy with the patient. There is significant time commitment involved with allergy shots and we will have to rebuild weekly for a few months. Consent was signed.  I have prescribed epinephrine injectable and demonstrated proper use. For mild symptoms you can take over the counter antihistamines such as Benadryl and monitor symptoms closely. If symptoms worsen or if you have severe symptoms including breathing issues, throat closure, significant swelling, whole body hives, severe diarrhea and vomiting, lightheadedness then inject epinephrine and seek immediate medical care afterwards.  Continue environmental control measures for trees, weed, dust mites, dog and ragweed.   Start Dymista 1 spray twice a day. This replaces all your nasal sprays. Let me know if it's too expensive. Sample given.   Nasal saline spray (i.e., Simply Saline) or nasal saline lavage (i.e., NeilMed) is recommended as needed and prior to medicated nasal sprays.  Continue Singulair 10mg  daily.  May use over the counter antihistamines such as Zyrtec (cetirizine), Claritin (loratadine), Allegra (fexofenadine), or Xyzal  (levocetirizine) daily as needed.

## 2018-09-04 NOTE — Progress Notes (Signed)
VIALS EXP 09-04-2019 

## 2018-09-05 DIAGNOSIS — J3089 Other allergic rhinitis: Secondary | ICD-10-CM | POA: Diagnosis not present

## 2018-09-13 ENCOUNTER — Other Ambulatory Visit: Payer: Self-pay

## 2018-09-13 ENCOUNTER — Ambulatory Visit (INDEPENDENT_AMBULATORY_CARE_PROVIDER_SITE_OTHER): Payer: BLUE CROSS/BLUE SHIELD

## 2018-09-13 DIAGNOSIS — J309 Allergic rhinitis, unspecified: Secondary | ICD-10-CM | POA: Diagnosis not present

## 2018-09-30 ENCOUNTER — Ambulatory Visit (INDEPENDENT_AMBULATORY_CARE_PROVIDER_SITE_OTHER): Payer: BLUE CROSS/BLUE SHIELD | Admitting: *Deleted

## 2018-09-30 DIAGNOSIS — J309 Allergic rhinitis, unspecified: Secondary | ICD-10-CM | POA: Diagnosis not present

## 2018-10-14 ENCOUNTER — Ambulatory Visit (INDEPENDENT_AMBULATORY_CARE_PROVIDER_SITE_OTHER): Payer: BLUE CROSS/BLUE SHIELD

## 2018-10-14 DIAGNOSIS — J309 Allergic rhinitis, unspecified: Secondary | ICD-10-CM | POA: Diagnosis not present

## 2018-10-14 NOTE — Progress Notes (Signed)
Immunotherapy   Patient Details  Name: Bonnie Lowe MRN: [id number] Date of Birth: 1971/04/16  10/14/2018  [name]  Following schedule: A Frequency: 1-2 Times Weekly Epi-Pen: Yes  Patient started on vials created by our office: T-RW-W and DM-DOG.  Consent signed and patient instructions given.    [name] 10/14/2018, 9:11 AM

## 2018-10-21 ENCOUNTER — Ambulatory Visit (INDEPENDENT_AMBULATORY_CARE_PROVIDER_SITE_OTHER): Payer: BLUE CROSS/BLUE SHIELD

## 2018-10-21 DIAGNOSIS — J309 Allergic rhinitis, unspecified: Secondary | ICD-10-CM

## 2018-10-28 ENCOUNTER — Ambulatory Visit (INDEPENDENT_AMBULATORY_CARE_PROVIDER_SITE_OTHER): Payer: BLUE CROSS/BLUE SHIELD

## 2018-10-28 DIAGNOSIS — J309 Allergic rhinitis, unspecified: Secondary | ICD-10-CM | POA: Diagnosis not present

## 2018-11-01 ENCOUNTER — Ambulatory Visit (INDEPENDENT_AMBULATORY_CARE_PROVIDER_SITE_OTHER): Payer: BLUE CROSS/BLUE SHIELD | Admitting: Allergy

## 2018-11-01 ENCOUNTER — Encounter: Payer: Self-pay | Admitting: Allergy

## 2018-11-01 ENCOUNTER — Other Ambulatory Visit: Payer: Self-pay

## 2018-11-01 VITALS — BP 108/72 | HR 90 | Temp 98.2°F | Resp 16

## 2018-11-01 DIAGNOSIS — J4530 Mild persistent asthma, uncomplicated: Secondary | ICD-10-CM

## 2018-11-01 DIAGNOSIS — J302 Other seasonal allergic rhinitis: Secondary | ICD-10-CM | POA: Diagnosis not present

## 2018-11-01 DIAGNOSIS — T50905D Adverse effect of unspecified drugs, medicaments and biological substances, subsequent encounter: Secondary | ICD-10-CM

## 2018-11-01 DIAGNOSIS — H1010 Acute atopic conjunctivitis, unspecified eye: Secondary | ICD-10-CM | POA: Diagnosis not present

## 2018-11-01 DIAGNOSIS — J3089 Other allergic rhinitis: Secondary | ICD-10-CM | POA: Diagnosis not present

## 2018-11-01 NOTE — Patient Instructions (Addendum)
Mild persistent asthma without complication  Daily controller medication(s):NONE  Prior to physical activity:May use albuterol rescue inhaler 2 puffs 5 to 15 minutes prior to strenuous physical activities.  Rescue medications:May use albuterol rescue inhaler 2 puffs or nebulizer every 4 to 6 hours as needed for shortness of breath, chest tightness, coughing, and wheezing. Monitor frequency of use.   During upper respiratory infections: Start Symbicort 80 2 puffs twice a day with spacer and rinse mouth afterwards for 1-2 weeks. Asthma control goals:  Full participation in all desired activities (may need albuterol before activity) Albuterol use two times or less a week on average (not counting use with activity) Cough interfering with sleep two times or less a month Oral steroids no more than once a year No hospitalizations  Seasonal and perennial allergic rhinitis  Continue environmental control measures for trees, weed, dust mites, dog and ragweed.   Continue allergy injections.   Continue Dymista 1 spray twice a day.   Nasal saline spray (i.e., Simply Saline) or nasal saline lavage (i.e., NeilMed) is recommended as needed and prior to medicated nasal sprays.  Continue Singulair 10mg  daily.  May use over the counter antihistamines such as Zyrtec (cetirizine), Claritin (loratadine), Allegra (fexofenadine), or Xyzal (levocetirizine) daily as needed.  Drug reaction Reactions to Avalox, sulfa and erythromycin in the past in the form of pruritus.  Continue to avoid above medications for now  Follow up in 3 months

## 2018-11-01 NOTE — Assessment & Plan Note (Addendum)
Doing well. Used Symbicort once a week with good benefit.  Today's spirometry was normal.   Daily controller medication(s):NONE  Prior to physical activity:May use albuterol rescue inhaler 2 puffs 5 to 15 minutes prior to strenuous physical activities.  Rescue medications:May use albuterol rescue inhaler 2 puffs or nebulizer every 4 to 6 hours as needed for shortness of breath, chest tightness, coughing, and wheezing. Monitor frequency of use.   During upper respiratory infections: Start Symbicort 80 2 puffs twice a day with spacer and rinse mouth afterwards for 1-2 weeks.

## 2018-11-01 NOTE — Progress Notes (Signed)
Follow Up Note  RE: Bonnie Lowe MRN: [id number] DOB: 04-19-1971 Date of Office Visit: 11/01/2018  Referring provider: Evangeline Gula* Primary care provider: Hezzie Bump, FNP  Chief Complaint: Allergies  History of Present Illness: I had the pleasure of seeing Bonnie Lowe for a follow up visit at the [facility] on 11/01/2018. She is a 48 y.o. female, who is being followed for asthma, allergic rhinoconjunctivitis, drug allergy. Today she is here for regular follow up visit. Her previous allergy office visit was on 08/30/2018 with Dr. Selena Batten.   Mild persistent asthma without complication Denies any SOB, coughing, wheezing, chest tightness, nocturnal awakenings, ER/urgent care visits or prednisone use since the last visit. No rescue inhaler use since the last visit but using Symbicort about once a week when she is feeling some chest tightness with good benefit.   Seasonal and perennial allergic rhinitis Started on allergy injections with no issues. Currently on Dymista 1 spray twice a day with good benefit. No nosebleeds. Still taking Singulair  daily and OTC antihistamines with good benefit.   Assessment and Plan: Bonnie Lowe is a 48 y.o. female with: Mild persistent asthma without complication Doing well. Used Symbicort once a week with good benefit.  Today's spirometry was normal.   Daily controller medication(s):NONE  Prior to physical activity:May use albuterol rescue inhaler 2 puffs 5 to 15 minutes prior to strenuous physical activities.  Rescue medications:May use albuterol rescue inhaler 2 puffs or nebulizer every 4 to 6 hours as needed for shortness of breath, chest tightness, coughing, and wheezing. Monitor frequency of use.   During upper respiratory infections: Start Symbicort 80 2 puffs twice a day with spacer and rinse mouth afterwards for 1-2 weeks.  Seasonal and perennial allergic rhinoconjunctivitis Past history - Perennial  rhino conjunctivitis symptoms for the past 40+ years but much improved since started AIT 10 years ago. Interim history - Doing well with below regimen.   Continue environmental control measures for trees, weed, dust mites, dog and ragweed.   Continue allergy injections - T/Rw/W + Dm/D  Continue Dymista 1 spray twice a day.   Nasal saline spray (i.e., Simply Saline) or nasal saline lavage (i.e., NeilMed) is recommended as needed and prior to medicated nasal sprays.  Continue Singulair  daily.  May use over the counter antihistamines such as Zyrtec (cetirizine), Claritin (loratadine), Allegra (fexofenadine), or Xyzal (levocetirizine) daily as needed.  Drug reaction Reactions to Avalox, sulfa and erythromycin in the past in the form of pruritus.  Continue to avoid above medications for now.   Return in about 3 months (around 02/01/2019).  Diagnostics: Spirometry:  Tracings reviewed. Her effort: Good reproducible efforts. FVC: 4.25L FEV1: 3.28L, 113% predicted FEV1/FVC ratio: 77% Interpretation: Spirometry consistent with normal pattern.  Please see scanned spirometry results for details.  Medication List:  Current Outpatient Medications  Medication Sig Dispense Refill  . albuterol (PROVENTIL HFA;VENTOLIN HFA) 108 (90 Base) MCG/ACT inhaler Inhale 2 puffs into the lungs every 4 (four) hours as needed.    . Azelastine-Fluticasone (DYMISTA) 137-50 MCG/ACT SUSP Place 1 spray into both nostrils 2 (two) times daily. 1 Bottle 5  . budesonide-formoterol (SYMBICORT) 80-4.5 MCG/ACT inhaler Inhale 2 puffs into the lungs 2 (two) times daily. 1 Inhaler 5  . cetirizine (ZYRTEC) 10 MG tablet Take 10 mg by mouth daily.    . Cholecalciferol (QC VITAMIN D3) 25 MCG (1000 UT) tablet Take 1,000 Units by mouth daily.    Marland Kitchen EPINEPHrine (AUVI-Q) 0.3  mg/0.3 mL IJ SOAJ injection Inject 0.3 mLs (0.3 mg total) into the muscle as needed for anaphylaxis. 2 Device 1  . ibuprofen (ADVIL,MOTRIN) 800 MG tablet  Take 800 mg by mouth 3 (three) times daily as needed.    Marland Kitchen levothyroxine (SYNTHROID) 75 MCG tablet Take 75 mcg by mouth daily before breakfast.    . liothyronine (CYTOMEL) 5 MCG tablet Take 5 mcg by mouth daily.    Marland Kitchen losartan (COZAAR) 100 MG tablet Take 100 mg by mouth daily.    Marland Kitchen MAGNESIUM GLYCINATE PLUS PO Take 400 mg by mouth daily.    . montelukast (SINGULAIR) 10 MG tablet Take 10 mg by mouth at bedtime.    Marland Kitchen Phentermine-Topiramate (QSYMIA) 7.5-46 MG CP24 Take 1 tablet by mouth daily.    . pimecrolimus (ELIDEL) 1 % cream Apply 1 application topically 2 (two) times daily.    Marland Kitchen spironolactone (ALDACTONE) 25 MG tablet Take 25 mg by mouth daily.    . vitamin k 100 MCG tablet Take 100 mcg by mouth daily.     No current facility-administered medications for this visit.    Allergies: Allergies  Allergen Reactions  . Moxifloxacin Hcl Itching  . Citrus Diarrhea  . Erythromycin   . Sulfa Antibiotics    I reviewed her past medical history, social history, family history, and environmental history and no significant changes have been reported from previous visit on 08/30/2018.  Review of Systems  Constitutional: Negative for appetite change, chills, fever and unexpected weight change.  HENT: Negative for congestion and rhinorrhea.   Eyes: Negative for itching.  Respiratory: Negative for cough, chest tightness, shortness of breath and wheezing.   Cardiovascular: Negative for chest pain.  Gastrointestinal: Negative for abdominal pain.  Genitourinary: Negative for difficulty urinating.  Skin: Negative for rash.  Allergic/Immunologic: Positive for environmental allergies. Negative for food allergies.  Neurological: Negative for headaches.   Objective: BP 108/72   Pulse 90   Temp 98.2 F (36.8 C) (Tympanic)   Resp 16   SpO2 97%  There is no height or weight on file to calculate BMI. Physical Exam  Constitutional: She is oriented to person, place, and time. She appears well-developed and  well-nourished.  HENT:  Head: Normocephalic and atraumatic.  Right Ear: External ear normal.  Left Ear: External ear normal.  Nose: Nose normal.  Mouth/Throat: Oropharynx is clear and moist.  Eyes: Conjunctivae and EOM are normal.  Neck: Neck supple.  Cardiovascular: Normal rate, regular rhythm and normal heart sounds. Exam reveals no gallop and no friction rub.  No murmur heard. Pulmonary/Chest: Effort normal and breath sounds normal. She has no wheezes. She has no rales.  Abdominal: Soft.  Neurological: She is alert and oriented to person, place, and time.  Skin: Skin is warm. No rash noted.  Psychiatric: She has a normal mood and affect. Her behavior is normal.  Nursing note and vitals reviewed.  Previous notes and tests were reviewed. The plan was reviewed with the patient/family, and all questions/concerned were addressed.  It was my pleasure to see Bonnie Lowe today and participate in her care. Please feel free to contact me with any questions or concerns.  Sincerely,  Wyline Mood, DO Allergy & Immunology  [facility] office: [phone] [facility] office: [phone]

## 2018-11-01 NOTE — Assessment & Plan Note (Signed)
Past history - Perennial rhino conjunctivitis symptoms for the past 40+ years but much improved since started AIT 10 years ago. Interim history - Doing well with below regimen.   Continue environmental control measures for trees, weed, dust mites, dog and ragweed.   Continue allergy injections - T/Rw/W + Dm/D  Continue Dymista 1 spray twice a day.   Nasal saline spray (i.e., Simply Saline) or nasal saline lavage (i.e., NeilMed) is recommended as needed and prior to medicated nasal sprays.  Continue Singulair 10mg  daily.  May use over the counter antihistamines such as Zyrtec (cetirizine), Claritin (loratadine), Allegra (fexofenadine), or Xyzal (levocetirizine) daily as needed.

## 2018-11-01 NOTE — Assessment & Plan Note (Signed)
Reactions to Avalox, sulfa and erythromycin in the past in the form of pruritus.  Continue to avoid above medications for now.

## 2018-11-08 ENCOUNTER — Ambulatory Visit (INDEPENDENT_AMBULATORY_CARE_PROVIDER_SITE_OTHER): Payer: BLUE CROSS/BLUE SHIELD

## 2018-11-08 DIAGNOSIS — J309 Allergic rhinitis, unspecified: Secondary | ICD-10-CM

## 2018-11-15 ENCOUNTER — Ambulatory Visit (INDEPENDENT_AMBULATORY_CARE_PROVIDER_SITE_OTHER): Payer: BLUE CROSS/BLUE SHIELD

## 2018-11-15 DIAGNOSIS — J309 Allergic rhinitis, unspecified: Secondary | ICD-10-CM

## 2018-11-25 ENCOUNTER — Ambulatory Visit (INDEPENDENT_AMBULATORY_CARE_PROVIDER_SITE_OTHER): Payer: BLUE CROSS/BLUE SHIELD

## 2018-11-25 DIAGNOSIS — J309 Allergic rhinitis, unspecified: Secondary | ICD-10-CM | POA: Diagnosis not present

## 2018-12-02 ENCOUNTER — Ambulatory Visit (INDEPENDENT_AMBULATORY_CARE_PROVIDER_SITE_OTHER): Payer: BLUE CROSS/BLUE SHIELD

## 2018-12-02 DIAGNOSIS — J309 Allergic rhinitis, unspecified: Secondary | ICD-10-CM | POA: Diagnosis not present

## 2018-12-11 ENCOUNTER — Ambulatory Visit (INDEPENDENT_AMBULATORY_CARE_PROVIDER_SITE_OTHER): Payer: BLUE CROSS/BLUE SHIELD | Admitting: *Deleted

## 2018-12-11 DIAGNOSIS — J309 Allergic rhinitis, unspecified: Secondary | ICD-10-CM | POA: Diagnosis not present

## 2018-12-20 ENCOUNTER — Ambulatory Visit (INDEPENDENT_AMBULATORY_CARE_PROVIDER_SITE_OTHER): Payer: BLUE CROSS/BLUE SHIELD

## 2018-12-20 DIAGNOSIS — J309 Allergic rhinitis, unspecified: Secondary | ICD-10-CM

## 2018-12-31 NOTE — Progress Notes (Signed)
VIALS EXP 12-31-2019

## 2019-01-02 DIAGNOSIS — J3089 Other allergic rhinitis: Secondary | ICD-10-CM | POA: Diagnosis not present

## 2019-01-06 ENCOUNTER — Ambulatory Visit (INDEPENDENT_AMBULATORY_CARE_PROVIDER_SITE_OTHER): Payer: BC Managed Care – PPO

## 2019-01-06 DIAGNOSIS — J309 Allergic rhinitis, unspecified: Secondary | ICD-10-CM | POA: Diagnosis not present

## 2019-01-20 ENCOUNTER — Ambulatory Visit (INDEPENDENT_AMBULATORY_CARE_PROVIDER_SITE_OTHER): Payer: BC Managed Care – PPO

## 2019-01-20 DIAGNOSIS — J309 Allergic rhinitis, unspecified: Secondary | ICD-10-CM

## 2019-01-27 ENCOUNTER — Ambulatory Visit (INDEPENDENT_AMBULATORY_CARE_PROVIDER_SITE_OTHER): Payer: BC Managed Care – PPO

## 2019-01-27 DIAGNOSIS — J309 Allergic rhinitis, unspecified: Secondary | ICD-10-CM

## 2019-02-06 NOTE — Progress Notes (Signed)
Follow Up Note  RE: Bonnie Lowe MRN: [id number] DOB: 1971/03/30 Date of Office Visit: 02/07/2019  Referring provider: Ermalinda Memos* Primary care provider: Renee Pain, FNP  Chief Complaint: Asthma  History of Present Illness: I had the pleasure of seeing Bonnie Lowe for a follow up visit at the [facility] [place] [facility]  on 02/07/2019. She is a 48 y.o. female, who is being followed for asthma, allergic rhinoconjunctivitis and drug allergies. Today she is here for regular follow up visit. Her previous allergy office visit was on 11/01/2018 with Dr. Maudie Mercury.   Mild persistent asthma without complication Patient was doing well until the PND drip increased.  Noticed some chest tightness lately.  Using Symbicort 80 2 puffs BID the last week with good benefit.  Denies any ER/urgent care visits or prednisone use since the last visit. Usually flares during the winter months.   Working in [employer] during the week and going to [place] on the weekends.   Seasonal and perennial allergic rhinoconjunctivitis Tolerating weekly injections with our new vials and doing well on it. About 2-3 weeks ago now having increased PND and coughing. Currently on Singulair daily, OTC antihistamines, Dymista 1 spray twice a day.   Assessment and Plan: Baila is a 48 y.o. female with: Mild persistent asthma without complication Was doing well up until last week and having increased chest tightness and rhinitis symptoms. Started taking Symbicort daily. Asthma flares during the winter months.   Today's spirometry was normal.   Continue Symbicort 80 2 puffs twice a day with spacer and rinse mouth afterwards until you feel like your breathing is under better control.  Daily controller medication(s):NONE  Prior to physical activity:May use albuterol rescue inhaler 2 puffs 5 to 15 minutes prior to strenuous physical activities.  Rescue medications:May use albuterol rescue inhaler 2 puffs  or nebulizer every 4 to 6 hours as needed for shortness of breath, chest tightness, coughing, and wheezing. Monitor frequency of use.   During upper respiratory infections: Start Symbicort 80 2 puffs twice a day with spacer and rinse mouth afterwards for 1-2 weeks.  Get flu vaccine in the fall.   Seasonal and perennial allergic rhinoconjunctivitis Past history - Perennial rhino conjunctivitis symptoms for the past 40+ years but much improved since started AIT 10 years ago. Interim history - was doing well up until a few weeks ago. Having increased PND. Tolerating allergy injections with no issues.   Continue environmental control measures for trees, weed, dust mites, dog and ragweed.   Continue allergy injections - T/Rw/W + Dm/D  May increase Dymista to 2 sprays per nostril twice a day.   Nasal saline spray (i.e., Simply Saline) or nasal saline lavage (i.e., NeilMed) is recommended as needed and prior to medicated nasal sprays.  Continue Singulair 10mg  daily.   May use over the counter antihistamines such as Zyrtec (cetirizine), Claritin (loratadine), Allegra (fexofenadine), or Xyzal (levocetirizine) daily as needed. And may take it TWICE a day for allergy flares.   If this regimen does not help then will try Atrovent nasal spray for the PND.   Drug reaction Past history - Reactions to Avalox, sulfa and erythromycin in the past in the form of pruritus.  Continue to avoid above medications for now.   Return in about 4 months (around 06/09/2019).  Meds ordered this encounter  Medications  . albuterol (VENTOLIN HFA) 108 (90 Base) MCG/ACT inhaler    Sig: Inhale 2 puffs into the lungs every 4 (  four) hours as needed.    Dispense:  18 g    Refill:  1   Diagnostics: Spirometry:  Tracings reviewed. Her effort: Good reproducible efforts. FVC: 4.14L FEV1: 3.28L, 121% predicted FEV1/FVC ratio: 79% Interpretation: Spirometry consistent with normal pattern.  Please see scanned  spirometry results for details.  Medication List:  Current Outpatient Medications  Medication Sig Dispense Refill  . albuterol (VENTOLIN HFA) 108 (90 Base) MCG/ACT inhaler Inhale 2 puffs into the lungs every 4 (four) hours as needed. 18 g 1  . Azelastine-Fluticasone (DYMISTA) 137-50 MCG/ACT SUSP Place 1 spray into both nostrils 2 (two) times daily. 1 Bottle 5  . budesonide-formoterol (SYMBICORT) 80-4.5 MCG/ACT inhaler Inhale 2 puffs into the lungs 2 (two) times daily. 1 Inhaler 5  . cetirizine (ZYRTEC) 10 MG tablet Take 10 mg by mouth daily.    . Cholecalciferol (QC VITAMIN D3) 25 MCG (1000 UT) tablet Take 1,000 Units by mouth daily.    Marland Kitchen EPINEPHrine (AUVI-Q) 0.3 mg/0.3 mL IJ SOAJ injection Inject 0.3 mLs (0.3 mg total) into the muscle as needed for anaphylaxis. 2 Device 1  . ibuprofen (ADVIL,MOTRIN) 800 MG tablet Take 800 mg by mouth 3 (three) times daily as needed.    Marland Kitchen levothyroxine (SYNTHROID) 75 MCG tablet Take 75 mcg by mouth daily before breakfast.    . liothyronine (CYTOMEL) 5 MCG tablet Take 5 mcg by mouth daily.    Marland Kitchen losartan (COZAAR) 50 MG tablet     . montelukast (SINGULAIR) 10 MG tablet Take 10 mg by mouth at bedtime.    . NON FORMULARY 2 injections week    . Phentermine-Topiramate (QSYMIA) 7.5-46 MG CP24 Take 1 tablet by mouth daily.    . pimecrolimus (ELIDEL) 1 % cream Apply 1 application topically 2 (two) times daily.    Marland Kitchen spironolactone (ALDACTONE) 25 MG tablet Take 25 mg by mouth daily.    . valACYclovir (VALTREX) 1000 MG tablet     . zolpidem (AMBIEN) 10 MG tablet Take 10 mg by mouth at bedtime.     No current facility-administered medications for this visit.    Allergies: Allergies  Allergen Reactions  . Moxifloxacin Hcl Itching  . Citrus Diarrhea  . Erythromycin   . Sulfa Antibiotics    I reviewed her past medical history, social history, family history, and environmental history and no significant changes have been reported from previous visit on 11/01/2018.   Review of Systems  Constitutional: Negative for appetite change, chills, fever and unexpected weight change.  HENT: Positive for postnasal drip. Negative for congestion and rhinorrhea.   Eyes: Negative for itching.  Respiratory: Positive for chest tightness. Negative for cough, shortness of breath and wheezing.   Cardiovascular: Negative for chest pain.  Gastrointestinal: Negative for abdominal pain.  Genitourinary: Negative for difficulty urinating.  Skin: Negative for rash.  Allergic/Immunologic: Positive for environmental allergies. Negative for food allergies.  Neurological: Negative for headaches.   Objective: BP 136/80 (BP Location: Right Arm, Patient Position: Sitting, Cuff Size: Normal)   Pulse 77   Temp 98 F (36.7 C) (Oral)   Resp 16   Ht 5\' 3"  (1.6 m)   Wt 169 lb 1.5 oz (76.7 kg)   SpO2 98%   BMI 29.95 kg/m  Body mass index is 29.95 kg/m. Physical Exam  Constitutional: She is oriented to person, place, and time. She appears well-developed and well-nourished.  HENT:  Head: Normocephalic and atraumatic.  Right Ear: External ear normal.  Left Ear: External ear normal.  Mouth/Throat:  Oropharynx is clear and moist.  Dried nasal discharge with blood present in nares.   Eyes: Conjunctivae and EOM are normal.  Neck: Neck supple.  Cardiovascular: Normal rate, regular rhythm and normal heart sounds. Exam reveals no gallop and no friction rub.  No murmur heard. Pulmonary/Chest: Effort normal and breath sounds normal. She has no wheezes. She has no rales.  Abdominal: Soft.  Neurological: She is alert and oriented to person, place, and time.  Skin: Skin is warm. No rash noted.  Psychiatric: She has a normal mood and affect. Her behavior is normal.  Nursing note and vitals reviewed.  Previous notes and tests were reviewed. The plan was reviewed with the patient/family, and all questions/concerned were addressed.  It was my pleasure to see [name] today and participate in  her care. Please feel free to contact me with any questions or concerns.  Sincerely,  Wyline MoodYoon Rickeya Manus, DO Allergy & Immunology  [facility] office: [phone] [facility] office: [phone] [place] office: [phone]

## 2019-02-07 ENCOUNTER — Other Ambulatory Visit: Payer: Self-pay

## 2019-02-07 ENCOUNTER — Ambulatory Visit (INDEPENDENT_AMBULATORY_CARE_PROVIDER_SITE_OTHER): Payer: BC Managed Care – PPO | Admitting: Allergy

## 2019-02-07 ENCOUNTER — Encounter: Payer: Self-pay | Admitting: Allergy

## 2019-02-07 VITALS — BP 136/80 | HR 77 | Temp 98.0°F | Resp 16 | Ht 63.0 in | Wt 169.1 lb

## 2019-02-07 DIAGNOSIS — H1010 Acute atopic conjunctivitis, unspecified eye: Secondary | ICD-10-CM

## 2019-02-07 DIAGNOSIS — T50905D Adverse effect of unspecified drugs, medicaments and biological substances, subsequent encounter: Secondary | ICD-10-CM

## 2019-02-07 DIAGNOSIS — J3089 Other allergic rhinitis: Secondary | ICD-10-CM

## 2019-02-07 DIAGNOSIS — J302 Other seasonal allergic rhinitis: Secondary | ICD-10-CM | POA: Diagnosis not present

## 2019-02-07 DIAGNOSIS — J4530 Mild persistent asthma, uncomplicated: Secondary | ICD-10-CM | POA: Diagnosis not present

## 2019-02-07 MED ORDER — ALBUTEROL SULFATE HFA 108 (90 BASE) MCG/ACT IN AERS: 2.0000 | INHALATION_SPRAY | RESPIRATORY_TRACT | 1 refills | Status: DC | PRN

## 2019-02-07 NOTE — Patient Instructions (Addendum)
Mild persistent asthma without complication  Continue Symbicort 80 2 puffs twice a day with spacer and rinse mouth afterwards until you feel like your breathing is under better control.  Daily controller medication(s):NONE  Prior to physical activity:May use albuterol rescue inhaler 2 puffs 5 to 15 minutes prior to strenuous physical activities.  Rescue medications:May use albuterol rescue inhaler 2 puffs or nebulizer every 4 to 6 hours as needed for shortness of breath, chest tightness, coughing, and wheezing. Monitor frequency of use.   During upper respiratory infections: Start Symbicort 80 2 puffs twice a day with spacer and rinse mouth afterwards for 1-2 weeks. . Asthma control goals:  . Full participation in all desired activities (may need albuterol before activity) . Albuterol use two times or less a week on average (not counting use with activity) . Cough interfering with sleep two times or less a month . Oral steroids no more than once a year . No hospitalizations  Seasonal and perennial allergic rhinoconjunctivitis  Continue environmental control measures for trees, weed, dust mites, dog and ragweed.   Continue allergy injections - T/Rw/W + Dm/D  May increase Dymista to 2 sprays per nostril twice a day.   Nasal saline spray (i.e., Simply Saline) or nasal saline lavage (i.e., NeilMed) is recommended as needed and prior to medicated nasal sprays.  Continue Singulair 10mg  daily.   May use over the counter antihistamines such as Zyrtec (cetirizine), Claritin (loratadine), Allegra (fexofenadine), or Xyzal (levocetirizine) daily as needed. And may take it TWICE a day for allergy flares.   Drug reaction Reactions to Avalox, sulfa and erythromycin in the past in the form of pruritus.  Continue to avoid above medications for now.   Follow up in 4 months Get flu vaccine in the fall.

## 2019-02-07 NOTE — Assessment & Plan Note (Signed)
Past history - Reactions to Avalox, sulfa and erythromycin in the past in the form of pruritus.  Continue to avoid above medications for now.

## 2019-02-07 NOTE — Assessment & Plan Note (Signed)
Past history - Perennial rhino conjunctivitis symptoms for the past 40+ years but much improved since started AIT 10 years ago. Interim history - was doing well up until a few weeks ago. Having increased PND. Tolerating allergy injections with no issues.   Continue environmental control measures for trees, weed, dust mites, dog and ragweed.   Continue allergy injections - T/Rw/W + Dm/D  May increase Dymista to 2 sprays per nostril twice a day.   Nasal saline spray (i.e., Simply Saline) or nasal saline lavage (i.e., NeilMed) is recommended as needed and prior to medicated nasal sprays.  Continue Singulair 10mg  daily.   May use over the counter antihistamines such as Zyrtec (cetirizine), Claritin (loratadine), Allegra (fexofenadine), or Xyzal (levocetirizine) daily as needed. And may take it TWICE a day for allergy flares.   If this regimen does not help then will try Atrovent nasal spray for the PND.

## 2019-02-07 NOTE — Assessment & Plan Note (Signed)
Was doing well up until last week and having increased chest tightness and rhinitis symptoms. Started taking Symbicort daily. Asthma flares during the winter months.   Today's spirometry was normal.   Continue Symbicort 80 2 puffs twice a day with spacer and rinse mouth afterwards until you feel like your breathing is under better control.  Daily controller medication(s):NONE  Prior to physical activity:May use albuterol rescue inhaler 2 puffs 5 to 15 minutes prior to strenuous physical activities.  Rescue medications:May use albuterol rescue inhaler 2 puffs or nebulizer every 4 to 6 hours as needed for shortness of breath, chest tightness, coughing, and wheezing. Monitor frequency of use.   During upper respiratory infections: Start Symbicort 80 2 puffs twice a day with spacer and rinse mouth afterwards for 1-2 weeks.  Get flu vaccine in the fall.

## 2019-02-27 ENCOUNTER — Ambulatory Visit (INDEPENDENT_AMBULATORY_CARE_PROVIDER_SITE_OTHER): Payer: BC Managed Care – PPO

## 2019-02-27 DIAGNOSIS — H1010 Acute atopic conjunctivitis, unspecified eye: Secondary | ICD-10-CM | POA: Diagnosis not present

## 2019-02-27 DIAGNOSIS — J3089 Other allergic rhinitis: Secondary | ICD-10-CM

## 2019-02-27 DIAGNOSIS — J302 Other seasonal allergic rhinitis: Secondary | ICD-10-CM

## 2019-03-10 ENCOUNTER — Ambulatory Visit (INDEPENDENT_AMBULATORY_CARE_PROVIDER_SITE_OTHER): Payer: BC Managed Care – PPO

## 2019-03-10 DIAGNOSIS — J309 Allergic rhinitis, unspecified: Secondary | ICD-10-CM

## 2019-03-21 ENCOUNTER — Other Ambulatory Visit: Payer: Self-pay

## 2019-03-21 MED ORDER — AZELASTINE-FLUTICASONE 137-50 MCG/ACT NA SUSP: 1.0000 | Freq: Two times a day (BID) | NASAL | 2 refills | Status: DC

## 2019-03-27 ENCOUNTER — Ambulatory Visit (INDEPENDENT_AMBULATORY_CARE_PROVIDER_SITE_OTHER): Payer: BC Managed Care – PPO

## 2019-03-27 DIAGNOSIS — J309 Allergic rhinitis, unspecified: Secondary | ICD-10-CM | POA: Diagnosis not present

## 2019-04-11 ENCOUNTER — Ambulatory Visit (INDEPENDENT_AMBULATORY_CARE_PROVIDER_SITE_OTHER): Payer: BC Managed Care – PPO

## 2019-04-11 DIAGNOSIS — J309 Allergic rhinitis, unspecified: Secondary | ICD-10-CM | POA: Diagnosis not present

## 2019-04-18 ENCOUNTER — Ambulatory Visit (INDEPENDENT_AMBULATORY_CARE_PROVIDER_SITE_OTHER): Payer: BC Managed Care – PPO

## 2019-04-18 DIAGNOSIS — J309 Allergic rhinitis, unspecified: Secondary | ICD-10-CM

## 2019-04-25 ENCOUNTER — Ambulatory Visit (INDEPENDENT_AMBULATORY_CARE_PROVIDER_SITE_OTHER): Payer: BC Managed Care – PPO

## 2019-04-25 DIAGNOSIS — J309 Allergic rhinitis, unspecified: Secondary | ICD-10-CM | POA: Diagnosis not present

## 2019-04-30 ENCOUNTER — Ambulatory Visit (INDEPENDENT_AMBULATORY_CARE_PROVIDER_SITE_OTHER): Payer: BC Managed Care – PPO

## 2019-04-30 DIAGNOSIS — J309 Allergic rhinitis, unspecified: Secondary | ICD-10-CM | POA: Diagnosis not present

## 2019-05-07 ENCOUNTER — Ambulatory Visit (INDEPENDENT_AMBULATORY_CARE_PROVIDER_SITE_OTHER): Payer: BC Managed Care – PPO

## 2019-05-07 DIAGNOSIS — J309 Allergic rhinitis, unspecified: Secondary | ICD-10-CM | POA: Diagnosis not present

## 2019-05-28 ENCOUNTER — Ambulatory Visit (INDEPENDENT_AMBULATORY_CARE_PROVIDER_SITE_OTHER): Payer: BC Managed Care – PPO

## 2019-05-28 DIAGNOSIS — J309 Allergic rhinitis, unspecified: Secondary | ICD-10-CM | POA: Diagnosis not present

## 2019-06-03 ENCOUNTER — Ambulatory Visit (INDEPENDENT_AMBULATORY_CARE_PROVIDER_SITE_OTHER): Payer: BC Managed Care – PPO

## 2019-06-03 DIAGNOSIS — J309 Allergic rhinitis, unspecified: Secondary | ICD-10-CM

## 2019-06-16 ENCOUNTER — Ambulatory Visit (INDEPENDENT_AMBULATORY_CARE_PROVIDER_SITE_OTHER): Payer: BC Managed Care – PPO

## 2019-06-16 DIAGNOSIS — J309 Allergic rhinitis, unspecified: Secondary | ICD-10-CM

## 2019-06-18 DIAGNOSIS — J3089 Other allergic rhinitis: Secondary | ICD-10-CM | POA: Diagnosis not present

## 2019-06-18 NOTE — Progress Notes (Signed)
VIALS EXP 06-17-20 

## 2019-06-23 ENCOUNTER — Ambulatory Visit (INDEPENDENT_AMBULATORY_CARE_PROVIDER_SITE_OTHER): Payer: BC Managed Care – PPO

## 2019-06-23 DIAGNOSIS — J309 Allergic rhinitis, unspecified: Secondary | ICD-10-CM

## 2019-06-27 ENCOUNTER — Telehealth: Payer: Self-pay

## 2019-06-27 MED ORDER — AZELASTINE-FLUTICASONE 137-50 MCG/ACT NA SUSP: 1.0000 | Freq: Two times a day (BID) | NASAL | 5 refills | Status: DC

## 2019-06-27 MED ORDER — AZELASTINE-FLUTICASONE 137-50 MCG/ACT NA SUSP: 1.0000 | Freq: Two times a day (BID) | NASAL | 1 refills | Status: DC

## 2019-06-27 NOTE — Telephone Encounter (Signed)
Sent in generic azelastine/fluticasone to the [facility] on [address] [place].

## 2019-06-30 ENCOUNTER — Ambulatory Visit (INDEPENDENT_AMBULATORY_CARE_PROVIDER_SITE_OTHER): Payer: BC Managed Care – PPO

## 2019-06-30 DIAGNOSIS — J309 Allergic rhinitis, unspecified: Secondary | ICD-10-CM

## 2019-07-03 NOTE — Progress Notes (Signed)
Follow Up Note  RE: Bonnie Lowe MRN: [id number] DOB: 1970-10-25 Date of Office Visit: 07/04/2019  Referring provider: Evangeline Gula* Primary care provider: Hezzie Bump, FNP  Chief Complaint: Asthma  History of Present Illness: I had the pleasure of seeing Bonnie Lowe for a [facility] up [facility]  on 07/04/2019. She is a 49 y.o. female, who is being followed for asthma, allergic rhinoconjunctivitis, drug reaction. Her previous allergy office visit was on 02/07/2019 with Dr. Selena Batten. Today is a regular follow up visit.  Asthma: Patient only used Symbicort 4-6 weeks after the last visit and has been doing well afterwards without it. Usually gets bronchitis in Nov/Dec but didn't get one the past year.  Denies any SOB, coughing, wheezing, chest tightness, nocturnal awakenings, ER/urgent care visits or prednisone use since the last visit.  Up to date with flu vaccine.   Seasonal and perennial allergic rhinoconjunctivitis Patient doing well with allergy injections and noticed benefits since starting 10 years ago.  Using dymista 2 sprays per nostril sometimes up 2-3 times a day due to drainage.  Still on Singulair 10mg  daily and cetirizine 10mg  BID with some benefit.  Patient just had some type of laser treatment which has caused some erythema.   Assessment and Plan: Bonnie Lowe is a 49 y.o. female with: Mild persistent asthma without complication Doing well with below regimen. Only had to take Symbicort for about 4-6 weeks after the last visit.   Today's spirometry was normal. ACT score 25.   Daily controller medication(s):NONE  Prior to physical activity:May use albuterol rescue inhaler 2 puffs 5 to 15 minutes prior to strenuous physical activities.  Rescue medications:May use albuterol rescue inhaler 2 puffs or nebulizer every 4 to 6 hours as needed for shortness of breath, chest tightness, coughing, and wheezing. Monitor frequency  of use.   During upper respiratory infections: Start Symbicort 80 2 puffs twice a day with spacer and rinse mouth afterwards for 1-2 weeks.  Seasonal and perennial allergic rhinoconjunctivitis Past history - Perennial rhino conjunctivitis symptoms for the past 40+ years but much improved since started AIT 10 years ago. Last skin testing was done in 2018/2019. Interim history - doing well with injections. Nasal drainage unchanged with increased dymista and twice a day antihistamine dose.   Continue environmental control measures for trees, weed, dust mites, dog and ragweed.   Continue allergy injections  DO NOT use Dymista more than 2 sprays per nostril twice a day.   Try Atrovent nasal spray 2 sprays per nostril twice a day for drainage as needed.   Nasal saline spray (i.e., Simply Saline) or nasal saline lavage (i.e., NeilMed) is recommended as needed and prior to medicated nasal sprays.  Continue Singulair 10mg  daily.   May use over the counter antihistamines such as Zyrtec (cetirizine), Claritin (loratadine), Allegra (fexofenadine), or Xyzal (levocetirizine) daily as needed. And may take it TWICE a day for allergy flares.   Drug reaction Past history - Reactions to Avalox, sulfa and erythromycin in the past in the form of pruritus.  Continue to avoid.   Return in about 4 months (around 11/01/2019).  Meds ordered this encounter  Medications  . Azelastine-Fluticasone (DYMISTA) 137-50 MCG/ACT SUSP    Sig: Place 1 spray into both nostrils 2 (two) times daily.    Dispense:  23 g    Refill:  5    Please prescribe generic.  10-09-1996 EPINEPHrine (AUVI-Q) 0.3 mg/0.3 mL IJ SOAJ injection  Sig: Inject 0.3 mLs (0.3 mg total) into the muscle as needed for anaphylaxis.    Dispense:  2 each    Refill:  1  . ipratropium (ATROVENT) 0.06 % nasal spray    Sig: Place 2 sprays into both nostrils daily as needed for rhinitis.    Dispense:  15 mL    Refill:  5   Diagnostics: Spirometry:   Tracings reviewed. Her effort: Good reproducible efforts. FVC: 4.25L FEV1: 3.27L, 109% predicted FEV1/FVC ratio: 77% Interpretation: Spirometry consistent with normal pattern.  Please see scanned spirometry results for details.  Medication List:  Current Outpatient Medications  Medication Sig Dispense Refill  . albuterol (VENTOLIN HFA) 108 (90 Base) MCG/ACT inhaler Inhale 2 puffs into the lungs every 4 (four) hours as needed. 18 g 1  . Azelastine-Fluticasone (DYMISTA) 137-50 MCG/ACT SUSP Place 1 spray into both nostrils 2 (two) times daily. 23 g 5  . cetirizine (ZYRTEC) 10 MG tablet Take 10 mg by mouth daily.    . Cholecalciferol (QC VITAMIN D3) 25 MCG (1000 UT) tablet Take 1,000 Units by mouth daily.    Marland Kitchen EPINEPHrine (AUVI-Q) 0.3 mg/0.3 mL IJ SOAJ injection Inject 0.3 mLs (0.3 mg total) into the muscle as needed for anaphylaxis. 2 each 1  . ibuprofen (ADVIL,MOTRIN) 800 MG tablet Take 800 mg by mouth 3 (three) times daily as needed.    Marland Kitchen levothyroxine (SYNTHROID) 75 MCG tablet Take 75 mcg by mouth daily before breakfast.    . liothyronine (CYTOMEL) 5 MCG tablet Take 5 mcg by mouth daily.    Marland Kitchen losartan (COZAAR) 50 MG tablet     . montelukast (SINGULAIR) 10 MG tablet Take 10 mg by mouth at bedtime.    . NON FORMULARY 2 injections week    . pimecrolimus (ELIDEL) 1 % cream Apply 1 application topically 2 (two) times daily.    Marland Kitchen spironolactone (ALDACTONE) 25 MG tablet Take 25 mg by mouth daily.    . budesonide-formoterol (SYMBICORT) 80-4.5 MCG/ACT inhaler Inhale 2 puffs into the lungs 2 (two) times daily. (Patient not taking: Reported on 07/04/2019) 1 Inhaler 5  . ipratropium (ATROVENT) 0.06 % nasal spray Place 2 sprays into both nostrils daily as needed for rhinitis. 15 mL 5  . valACYclovir (VALTREX) 1000 MG tablet      No current facility-administered medications for this visit.   Allergies: Allergies  Allergen Reactions  . Moxifloxacin Hcl Itching  . Citrus Diarrhea  . Erythromycin    . Sulfa Antibiotics    I reviewed her past medical history, social history, family history, and environmental history and no significant changes have been reported from her previous visit.  Review of Systems  Constitutional: Negative for appetite change, chills, fever and unexpected weight change.  HENT: Positive for postnasal drip and sneezing. Negative for congestion and rhinorrhea.   Eyes: Negative for itching.  Respiratory: Negative for cough, chest tightness, shortness of breath and wheezing.   Cardiovascular: Negative for chest pain.  Gastrointestinal: Negative for abdominal pain.  Genitourinary: Negative for difficulty urinating.  Skin: Negative for rash.  Allergic/Immunologic: Positive for environmental allergies. Negative for food allergies.  Neurological: Negative for headaches.   Objective: BP 102/72   Pulse 88   Temp (!) 96.1 F (35.6 C) (Temporal)   Resp 16   Ht 5' 5.7" (1.669 m)   Wt 176 lb 6.4 oz (80 kg)   SpO2 99%   BMI 28.73 kg/m  Body mass index is 28.73 kg/m. Physical Exam  Constitutional: She is  oriented to person, place, and time. She appears well-developed and well-nourished.  HENT:  Head: Normocephalic and atraumatic.  Right Ear: External ear normal.  Left Ear: External ear normal.  Mouth/Throat: Oropharynx is clear and moist.  Eyes: Conjunctivae and EOM are normal.  Cardiovascular: Normal rate, regular rhythm and normal heart sounds. Exam reveals no gallop and no friction rub.  No murmur heard. Pulmonary/Chest: Effort normal and breath sounds normal. She has no wheezes. She has no rales.  Abdominal: Soft.  Musculoskeletal:     Cervical back: Neck supple.  Neurological: She is alert and oriented to person, place, and time.  Skin: Skin is warm. Rash noted.  Multiple erythematous circular patches on anterior chest area from laser treatment.   Psychiatric: She has a normal mood and affect. Her behavior is normal.  Nursing note and vitals  reviewed.  Previous notes and tests were reviewed. The plan was reviewed with the patient/family, and all questions/concerned were addressed.  It was my pleasure to see Bonnie Lowe today and participate in her care. Please feel free to contact me with any questions or concerns.  Sincerely,  Wyline Mood, DO Allergy & Immunology  [facility] office: [phone] [facility] office: [phone] [place] office: [phone]

## 2019-07-04 ENCOUNTER — Ambulatory Visit (INDEPENDENT_AMBULATORY_CARE_PROVIDER_SITE_OTHER): Payer: BC Managed Care – PPO | Admitting: Allergy

## 2019-07-04 ENCOUNTER — Telehealth: Payer: Self-pay | Admitting: Pediatrics

## 2019-07-04 ENCOUNTER — Other Ambulatory Visit: Payer: Self-pay

## 2019-07-04 ENCOUNTER — Encounter: Payer: Self-pay | Admitting: Allergy

## 2019-07-04 VITALS — BP 102/72 | HR 88 | Temp 96.1°F | Resp 16 | Ht 65.7 in | Wt 176.4 lb

## 2019-07-04 DIAGNOSIS — H1010 Acute atopic conjunctivitis, unspecified eye: Secondary | ICD-10-CM

## 2019-07-04 DIAGNOSIS — T50905D Adverse effect of unspecified drugs, medicaments and biological substances, subsequent encounter: Secondary | ICD-10-CM

## 2019-07-04 DIAGNOSIS — J4530 Mild persistent asthma, uncomplicated: Secondary | ICD-10-CM

## 2019-07-04 DIAGNOSIS — J3089 Other allergic rhinitis: Secondary | ICD-10-CM

## 2019-07-04 DIAGNOSIS — J302 Other seasonal allergic rhinitis: Secondary | ICD-10-CM

## 2019-07-04 MED ORDER — IPRATROPIUM BROMIDE 0.06 % NA SOLN: 2.0000 | Freq: Every day | NASAL | 5 refills | Status: DC | PRN

## 2019-07-04 MED ORDER — AZELASTINE-FLUTICASONE 137-50 MCG/ACT NA SUSP: 1.0000 | Freq: Two times a day (BID) | NASAL | 5 refills | Status: DC

## 2019-07-04 MED ORDER — EPINEPHRINE 0.3 MG/0.3ML IJ SOAJ: 0.3000 mg | INTRAMUSCULAR | 1 refills | Status: DC | PRN

## 2019-07-04 NOTE — Assessment & Plan Note (Signed)
Past history - Perennial rhino conjunctivitis symptoms for the past 40+ years but much improved since started AIT 10 years ago. Last skin testing was done in 2018/2019. Interim history - doing well with injections. Nasal drainage unchanged with increased dymista and twice a day antihistamine dose.   Continue environmental control measures for trees, weed, dust mites, dog and ragweed.   Continue allergy injections  DO NOT use Dymista more than 2 sprays per nostril twice a day.   Try Atrovent nasal spray 2 sprays per nostril twice a day for drainage as needed.   Nasal saline spray (i.e., Simply Saline) or nasal saline lavage (i.e., NeilMed) is recommended as needed and prior to medicated nasal sprays.  Continue Singulair 10mg  daily.   May use over the counter antihistamines such as Zyrtec (cetirizine), Claritin (loratadine), Allegra (fexofenadine), or Xyzal (levocetirizine) daily as needed. And may take it TWICE a day for allergy flares.

## 2019-07-04 NOTE — Assessment & Plan Note (Signed)
Past history - Reactions to Avalox, sulfa and erythromycin in the past in the form of pruritus.  Continue to avoid.  

## 2019-07-04 NOTE — Assessment & Plan Note (Signed)
Doing well with below regimen. Only had to take Symbicort for about 4-6 weeks after the last visit.   Today's spirometry was normal. ACT score 25.   Daily controller medication(s):NONE  Prior to physical activity:May use albuterol rescue inhaler 2 puffs 5 to 15 minutes prior to strenuous physical activities.  Rescue medications:May use albuterol rescue inhaler 2 puffs or nebulizer every 4 to 6 hours as needed for shortness of breath, chest tightness, coughing, and wheezing. Monitor frequency of use.   During upper respiratory infections: Start Symbicort 80 2 puffs twice a day with spacer and rinse mouth afterwards for 1-2 weeks.

## 2019-07-04 NOTE — Patient Instructions (Addendum)
Mild persistent asthma   Today's spirometry was normal.   Daily controller medication(s):NONE  Prior to physical activity:May use albuterol rescue inhaler 2 puffs 5 to 15 minutes prior to strenuous physical activities.  Rescue medications:May use albuterol rescue inhaler 2 puffs or nebulizer every 4 to 6 hours as needed for shortness of breath, chest tightness, coughing, and wheezing. Monitor frequency of use.   During upper respiratory infections: Start Symbicort 80 2 puffs twice a day with spacer and rinse mouth afterwards for 1-2 weeks. Asthma control goals:  Full participation in all desired activities (may need albuterol before activity) Albuterol use two times or less a week on average (not counting use with activity) Cough interfering with sleep two times or less a month Oral steroids no more than once a year No hospitalizations  Seasonal and perennial allergic rhinoconjunctivitis  Continue environmental control measures for trees, weed, dust mites, dog and ragweed.   Continue allergy injections  DO NOT use Dymista more than 2 sprays per nostril twice a day.   Try Atrovent nasal spray 2 sprays per nostril twice a day for drainage.   Nasal saline spray (i.e., Simply Saline) or nasal saline lavage (i.e., NeilMed) is recommended as needed and prior to medicated nasal sprays.  Continue Singulair 10mg  daily.   May use over the counter antihistamines such as Zyrtec (cetirizine), Claritin (loratadine), Allegra (fexofenadine), or Xyzal (levocetirizine) daily as needed. And may take it TWICE a day for allergy flares.   Drug reaction Past history - Reactions to Avalox, sulfa and erythromycin in the past in the form of pruritus.  Continue to avoid above medications for now.   Follow up in 4 months or sooner if needed.

## 2019-07-04 NOTE — Telephone Encounter (Signed)
Made in error

## 2019-07-07 ENCOUNTER — Ambulatory Visit (INDEPENDENT_AMBULATORY_CARE_PROVIDER_SITE_OTHER): Payer: BC Managed Care – PPO

## 2019-07-07 DIAGNOSIS — J302 Other seasonal allergic rhinitis: Secondary | ICD-10-CM

## 2019-07-07 DIAGNOSIS — H1010 Acute atopic conjunctivitis, unspecified eye: Secondary | ICD-10-CM

## 2019-07-07 DIAGNOSIS — J3089 Other allergic rhinitis: Secondary | ICD-10-CM

## 2019-07-14 ENCOUNTER — Ambulatory Visit (INDEPENDENT_AMBULATORY_CARE_PROVIDER_SITE_OTHER): Payer: BC Managed Care – PPO

## 2019-07-14 DIAGNOSIS — J309 Allergic rhinitis, unspecified: Secondary | ICD-10-CM

## 2019-07-21 ENCOUNTER — Ambulatory Visit (INDEPENDENT_AMBULATORY_CARE_PROVIDER_SITE_OTHER): Payer: BC Managed Care – PPO

## 2019-07-21 DIAGNOSIS — J309 Allergic rhinitis, unspecified: Secondary | ICD-10-CM

## 2019-07-30 ENCOUNTER — Ambulatory Visit (INDEPENDENT_AMBULATORY_CARE_PROVIDER_SITE_OTHER): Payer: BC Managed Care – PPO

## 2019-07-30 DIAGNOSIS — J309 Allergic rhinitis, unspecified: Secondary | ICD-10-CM | POA: Diagnosis not present

## 2019-08-06 ENCOUNTER — Ambulatory Visit (INDEPENDENT_AMBULATORY_CARE_PROVIDER_SITE_OTHER): Payer: BC Managed Care – PPO

## 2019-08-06 DIAGNOSIS — J309 Allergic rhinitis, unspecified: Secondary | ICD-10-CM

## 2019-08-13 ENCOUNTER — Ambulatory Visit (INDEPENDENT_AMBULATORY_CARE_PROVIDER_SITE_OTHER): Payer: BC Managed Care – PPO | Admitting: *Deleted

## 2019-08-13 DIAGNOSIS — J309 Allergic rhinitis, unspecified: Secondary | ICD-10-CM

## 2019-08-27 ENCOUNTER — Ambulatory Visit (INDEPENDENT_AMBULATORY_CARE_PROVIDER_SITE_OTHER): Payer: BC Managed Care – PPO

## 2019-08-27 DIAGNOSIS — J309 Allergic rhinitis, unspecified: Secondary | ICD-10-CM | POA: Diagnosis not present

## 2019-09-01 ENCOUNTER — Ambulatory Visit (INDEPENDENT_AMBULATORY_CARE_PROVIDER_SITE_OTHER): Payer: BC Managed Care – PPO

## 2019-09-01 DIAGNOSIS — J309 Allergic rhinitis, unspecified: Secondary | ICD-10-CM

## 2019-09-19 ENCOUNTER — Ambulatory Visit (INDEPENDENT_AMBULATORY_CARE_PROVIDER_SITE_OTHER): Payer: BC Managed Care – PPO | Admitting: *Deleted

## 2019-09-19 DIAGNOSIS — J309 Allergic rhinitis, unspecified: Secondary | ICD-10-CM

## 2019-10-01 ENCOUNTER — Ambulatory Visit (INDEPENDENT_AMBULATORY_CARE_PROVIDER_SITE_OTHER): Payer: BC Managed Care – PPO

## 2019-10-01 DIAGNOSIS — J309 Allergic rhinitis, unspecified: Secondary | ICD-10-CM | POA: Diagnosis not present

## 2019-10-07 DIAGNOSIS — J3089 Other allergic rhinitis: Secondary | ICD-10-CM | POA: Diagnosis not present

## 2019-10-07 NOTE — Progress Notes (Signed)
Vials exp 10-01-25

## 2019-10-08 ENCOUNTER — Ambulatory Visit (INDEPENDENT_AMBULATORY_CARE_PROVIDER_SITE_OTHER): Payer: BC Managed Care – PPO | Admitting: *Deleted

## 2019-10-08 DIAGNOSIS — J309 Allergic rhinitis, unspecified: Secondary | ICD-10-CM | POA: Diagnosis not present

## 2019-10-15 ENCOUNTER — Ambulatory Visit (INDEPENDENT_AMBULATORY_CARE_PROVIDER_SITE_OTHER): Payer: BC Managed Care – PPO

## 2019-10-15 DIAGNOSIS — J309 Allergic rhinitis, unspecified: Secondary | ICD-10-CM | POA: Diagnosis not present

## 2019-10-22 ENCOUNTER — Ambulatory Visit (INDEPENDENT_AMBULATORY_CARE_PROVIDER_SITE_OTHER): Payer: BC Managed Care – PPO | Admitting: *Deleted

## 2019-10-22 DIAGNOSIS — J309 Allergic rhinitis, unspecified: Secondary | ICD-10-CM

## 2019-10-30 ENCOUNTER — Ambulatory Visit (INDEPENDENT_AMBULATORY_CARE_PROVIDER_SITE_OTHER): Payer: BC Managed Care – PPO

## 2019-10-30 DIAGNOSIS — J309 Allergic rhinitis, unspecified: Secondary | ICD-10-CM | POA: Diagnosis not present

## 2019-11-07 ENCOUNTER — Ambulatory Visit: Payer: BC Managed Care – PPO | Admitting: Allergy

## 2019-11-24 ENCOUNTER — Other Ambulatory Visit: Payer: Self-pay

## 2019-11-24 ENCOUNTER — Ambulatory Visit (INDEPENDENT_AMBULATORY_CARE_PROVIDER_SITE_OTHER): Payer: BC Managed Care – PPO | Admitting: Allergy and Immunology

## 2019-11-24 ENCOUNTER — Encounter: Payer: Self-pay | Admitting: Allergy and Immunology

## 2019-11-24 VITALS — BP 100/66 | HR 94 | Temp 98.0°F | Resp 16

## 2019-11-24 DIAGNOSIS — R05 Cough: Secondary | ICD-10-CM

## 2019-11-24 DIAGNOSIS — J4540 Moderate persistent asthma, uncomplicated: Secondary | ICD-10-CM | POA: Diagnosis not present

## 2019-11-24 DIAGNOSIS — R052 Subacute cough: Secondary | ICD-10-CM | POA: Insufficient documentation

## 2019-11-24 DIAGNOSIS — J3089 Other allergic rhinitis: Secondary | ICD-10-CM | POA: Diagnosis not present

## 2019-11-24 DIAGNOSIS — J302 Other seasonal allergic rhinitis: Secondary | ICD-10-CM

## 2019-11-24 DIAGNOSIS — R053 Chronic cough: Secondary | ICD-10-CM | POA: Insufficient documentation

## 2019-11-24 MED ORDER — PREDNISONE 1 MG PO TABS: 10.0000 mg | ORAL_TABLET | Freq: Every day | ORAL | Status: DC

## 2019-11-24 MED ORDER — BUDESONIDE-FORMOTEROL FUMARATE 160-4.5 MCG/ACT IN AERO: 2.0000 | INHALATION_SPRAY | Freq: Two times a day (BID) | RESPIRATORY_TRACT | 5 refills | Status: DC

## 2019-11-24 MED ORDER — HYDROCOD POLST-CPM POLST ER 10-8 MG/5ML PO SUER: 5.0000 mL | Freq: Two times a day (BID) | ORAL | 0 refills | Status: AC

## 2019-11-24 NOTE — Assessment & Plan Note (Addendum)
   Continue azelastine/fluticasone nasal spray, with 1 spray per nostril twice daily as needed.  Continue ipratropium nasal spray, 1 to 2 sprays per nostril 2-3 times daily as needed.  Nasal saline lavage (NeilMed) has been recommended as needed and prior to medicated nasal sprays along with instructions for proper administration.  For thick post nasal drainage, add guaifenesin 1200 mg (Mucinex Maximum Strength)  twice daily as needed with adequate hydration as discussed.  Return for allergy retest in 6 weeks.  Please be off of azelastine nasal spray and all oral antihistamines at least 3 days prior to testing.

## 2019-11-24 NOTE — Progress Notes (Signed)
Follow-up Note  RE: Bonnie Lowe MRN: [id number] DOB: 1970-06-16 Date of Office Visit: 11/24/2019  Primary care provider: Hezzie Bump, FNP Referring provider: Hezzie Bump, F*  History of present illness: Bonnie Lowe is a 49 y.o. female with persistent asthma and allergic rhinoconjunctivitis on immunotherapy presenting today for follow-up.  She is previously seen in this clinic on July 03, 2019.  She reports that she developed bronchitis 11 days ago.  She went to urgent care and was given a steroid injection and started on a 10-day course of Augmentin.  She has noted some improvement, however the cough has persisted.  She does not complain of fevers, chills, or discolored mucus production.  She is also experiencing "constant drainage."  The postnasal drainage preceded the bronchitis and she has been taking ipratropium nasal spray and azelastine nasal spray.  She reports that she ran out of Symbicort 80 g approximately a week ago and needs a refill.  She admits that she does not use a spacer device with her HFA inhalers. She has been on immunotherapy injections for many years.  She started when she lived in [place], [place] and brought her vials with her when she transferred to this practice approximately 1 year ago.  Assessment and plan: Cough, persistent The history and physical examination suggest that her cough is multifactorial with contribution from postnasal drainage, hyperresponsiveness, and possible silent reflux induced by the coughing itself. We will address these issues at this time.   Prednisone has been provided, 40 mg x3 days, 20 mg x1 day, 10 mg x1 day, then stop.  A prescription has been provided for Tussionex ER suspension, 5 mL every 12 hours over the next 3 days, then stop.  A prescription has been provided for omeprazole 20 mg daily, 30 minutes prior to breakfast.  She will take this medication for the next 30 days.  Treatment plan as outlined  below.    If she develops fevers, chills, or foul mucus production, we will check a chest x-ray.  We will regroup in 6 weeks to assess treatment response and adjust therapy accordingly.  Moderate persistent asthma  A prescription has been provided for Symbicort (budesonide/formoterol) 160/4.5 g, 2 inhalations twice a day. To maximize pulmonary deposition, a spacer has been provided along with instructions for its proper administration with an HFA inhaler.  Continue montelukast 10 mg daily at bedtime.  Subjective and objective measures of pulmonary function will be followed and the treatment plan will be adjusted accordingly.  Seasonal and perennial allergic rhinitis  Continue azelastine/fluticasone nasal spray, with 1 spray per nostril twice daily as needed.  Continue ipratropium nasal spray, 1 to 2 sprays per nostril 2-3 times daily as needed.  Nasal saline lavage (NeilMed) has been recommended as needed and prior to medicated nasal sprays along with instructions for proper administration.  For thick post nasal drainage, add guaifenesin 1200 mg (Mucinex Maximum Strength)  twice daily as needed with adequate hydration as discussed.  Return for allergy retest in 6 weeks.  Please be off of azelastine nasal spray and all oral antihistamines at least 3 days prior to testing.   Meds ordered this encounter  Medications  . chlorpheniramine-HYDROcodone (TUSSIONEX PENNKINETIC ER) 10-8 MG/5ML SUER    Sig: Take 5 mLs by mouth every 12 (twelve) hours for 3 days.    Dispense:  140 mL    Refill:  0  . budesonide-formoterol (SYMBICORT) 160-4.5 MCG/ACT inhaler    Sig: Inhale 2 puffs into the  lungs 2 (two) times daily.    Dispense:  1 Inhaler    Refill:  5  . predniSONE (DELTASONE) tablet 10 mg    Diagnostics: Spirometry reveals an FVC of 4.21 L and an FEV1 of 3.22 L with 230 mL postbronchodilator improvement.  Please see scanned spirometry results for details.    Physical  examination: Blood pressure 100/66, pulse 94, temperature 98 F (36.7 C), temperature source Oral, resp. rate 16, SpO2 96 %.  General: Alert, interactive, in no acute distress. HEENT: TMs pearly gray, turbinates moderately edematous with thick discharge, post-pharynx moderately erythematous. Neck: Supple without lymphadenopathy. Lungs: Clear to auscultation without wheezing, rhonchi or rales. CV: Normal S1, S2 without murmurs. Skin: Warm and dry, without lesions or rashes.  The following portions of the patient's history were reviewed and updated as appropriate: allergies, current medications, past family history, past medical history, past social history, past surgical history and problem list.  Current Outpatient Medications  Medication Sig Dispense Refill  . albuterol (VENTOLIN HFA) 108 (90 Base) MCG/ACT inhaler Inhale 2 puffs into the lungs every 4 (four) hours as needed. 18 g 1  . Azelastine-Fluticasone (DYMISTA) 137-50 MCG/ACT SUSP Place 1 spray into both nostrils 2 (two) times daily. 23 g 5  . cetirizine (ZYRTEC) 10 MG tablet Take 10 mg by mouth 2 (two) times daily.     . Cholecalciferol (QC VITAMIN D3) 25 MCG (1000 UT) tablet Take 1,000 Units by mouth daily.    Marland Kitchen EPINEPHrine (AUVI-Q) 0.3 mg/0.3 mL IJ SOAJ injection Inject 0.3 mLs (0.3 mg total) into the muscle as needed for anaphylaxis. 2 each 1  . ibuprofen (ADVIL,MOTRIN) 800 MG tablet Take 800 mg by mouth 3 (three) times daily as needed.    Marland Kitchen ipratropium (ATROVENT) 0.06 % nasal spray Place 2 sprays into both nostrils daily as needed for rhinitis. 15 mL 5  . levothyroxine (SYNTHROID) 75 MCG tablet Take 75 mcg by mouth daily before breakfast.    . liothyronine (CYTOMEL) 5 MCG tablet Take 5 mcg by mouth daily.    Marland Kitchen losartan (COZAAR) 50 MG tablet     . montelukast (SINGULAIR) 10 MG tablet Take 10 mg by mouth at bedtime.    . NON FORMULARY 2 injections week    . phentermine (ADIPEX-P) 37.5 MG tablet Take by mouth.    . pimecrolimus  (ELIDEL) 1 % cream Apply 1 application topically 2 (two) times daily.    Marland Kitchen spironolactone (ALDACTONE) 25 MG tablet Take 25 mg by mouth daily.    . valACYclovir (VALTREX) 1000 MG tablet     . budesonide-formoterol (SYMBICORT) 160-4.5 MCG/ACT inhaler Inhale 2 puffs into the lungs 2 (two) times daily. 1 Inhaler 5  . chlorpheniramine-HYDROcodone (TUSSIONEX PENNKINETIC ER) 10-8 MG/5ML SUER Take 5 mLs by mouth every 12 (twelve) hours for 3 days. 140 mL 0   Current Facility-Administered Medications  Medication Dose Route Frequency Provider Last Rate Last Admin  . [START ON 11/25/2019] predniSONE (DELTASONE) tablet 10 mg  10 mg Oral Q breakfast Sanaia Jasso, Heywood Iles, MD        Allergies  Allergen Reactions  . Moxifloxacin Hcl Itching  . Citrus Diarrhea  . Erythromycin   . Sulfa Antibiotics    Review of systems: Review of systems negative except as noted in HPI / PMHx.  Past Medical History:  Diagnosis Date  . Angio-edema   . Asthma   . Hypertension     Family History  Problem Relation Age of Onset  . Allergic rhinitis  Mother   . Angioedema Neg Hx   . Asthma Neg Hx   . Atopy Neg Hx   . Immunodeficiency Neg Hx   . Urticaria Neg Hx   . Eczema Neg Hx     Social History   Socioeconomic History  . Marital status: Single    Spouse name: Not on file  . Number of children: Not on file  . Years of education: Not on file  . Highest education level: Not on file  Occupational History  . Not on file  Tobacco Use  . Smoking status: Never Smoker  . Smokeless tobacco: Never Used  Vaping Use  . Vaping Use: Never used  Substance and Sexual Activity  . Alcohol use: No  . Drug use: Never  . Sexual activity: Not on file  Other Topics Concern  . Not on file  Social History Narrative  . Not on file   Social Determinants of Health   Financial Resource Strain:   . Difficulty of Paying Living Expenses:   Food Insecurity:   . Worried About Charity fundraiser in the Last Year:   . Arts development officer in the Last Year:   Transportation Needs:   . Film/video editor (Medical):   Marland Kitchen Lack of Transportation (Non-Medical):   Physical Activity:   . Days of Exercise per Week:   . Minutes of Exercise per Session:   Stress:   . Feeling of Stress :   Social Connections:   . Frequency of Communication with Friends and Family:   . Frequency of Social Gatherings with Friends and Family:   . Attends Religious Services:   . Active Member of Clubs or Organizations:   . Attends Archivist Meetings:   Marland Kitchen Marital Status:   Intimate Partner Violence:   . Fear of Current or Ex-Partner:   . Emotionally Abused:   Marland Kitchen Physically Abused:   . Sexually Abused:     I appreciate the opportunity to take part in Samara's care. Please do not hesitate to contact me with questions.  Sincerely,   R. Edgar Frisk, MD

## 2019-11-24 NOTE — Patient Instructions (Addendum)
Cough, persistent The history and physical examination suggest that her cough is multifactorial with contribution from postnasal drainage, hyperresponsiveness, and possible silent reflux induced by the coughing itself. We will address these issues at this time.   Prednisone has been provided, 40 mg x3 days, 20 mg x1 day, 10 mg x1 day, then stop.  A prescription has been provided for Tussionex ER suspension, 5 mL every 12 hours over the next 3 days, then stop.  A prescription has been provided for omeprazole 20 mg daily, 30 minutes prior to breakfast.  She will take this medication for the next 30 days.  Treatment plan as outlined below.    If she develops fevers, chills, or foul mucus production, we will check a chest x-ray.  We will regroup in 6 weeks to assess treatment response and adjust therapy accordingly.  Moderate persistent asthma  A prescription has been provided for Symbicort (budesonide/formoterol) 160/4.5 g, 2 inhalations twice a day. To maximize pulmonary deposition, a spacer has been provided along with instructions for its proper administration with an HFA inhaler.  Continue montelukast 10 mg daily at bedtime.  Subjective and objective measures of pulmonary function will be followed and the treatment plan will be adjusted accordingly.  Seasonal and perennial allergic rhinitis  Continue azelastine/fluticasone nasal spray, with 1 spray per nostril twice daily as needed.  Continue ipratropium nasal spray, 1 to 2 sprays per nostril 2-3 times daily as needed.  Nasal saline lavage (NeilMed) has been recommended as needed and prior to medicated nasal sprays along with instructions for proper administration.  For thick post nasal drainage, add guaifenesin 1200 mg (Mucinex Maximum Strength)  twice daily as needed with adequate hydration as discussed.  Return for allergy retest in 6 weeks.  Please be off of azelastine nasal spray and all oral antihistamines at least 3 days  prior to testing.   Return in about 6 weeks (around 01/05/2020), for recheck and allergy skin testing with Dr. Nunzio Cobbs.  Please be off of azelastine nasal spray and all oral antihistamines for at least 3 to 4 days prior to skin testing.

## 2019-11-24 NOTE — Assessment & Plan Note (Signed)
   A prescription has been provided for Symbicort (budesonide/formoterol) 160/4.5 g, 2 inhalations twice a day. To maximize pulmonary deposition, a spacer has been provided along with instructions for its proper administration with an HFA inhaler.  Continue montelukast 10 mg daily at bedtime.  Subjective and objective measures of pulmonary function will be followed and the treatment plan will be adjusted accordingly.

## 2019-11-24 NOTE — Assessment & Plan Note (Addendum)
The history and physical examination suggest that her cough is multifactorial with contribution from postnasal drainage, hyperresponsiveness, and possible silent reflux induced by the coughing itself. We will address these issues at this time.   Prednisone has been provided, 40 mg x3 days, 20 mg x1 day, 10 mg x1 day, then stop.  A prescription has been provided for Tussionex ER suspension, 5 mL every 12 hours over the next 3 days, then stop.  A prescription has been provided for omeprazole 20 mg daily, 30 minutes prior to breakfast.  She will take this medication for the next 30 days.  Treatment plan as outlined below.    If she develops fevers, chills, or foul mucus production, we will check a chest x-ray.  We will regroup in 6 weeks to assess treatment response and adjust therapy accordingly.

## 2019-11-26 ENCOUNTER — Other Ambulatory Visit: Payer: Self-pay

## 2019-11-26 ENCOUNTER — Telehealth: Payer: Self-pay | Admitting: Allergy and Immunology

## 2019-11-26 MED ORDER — OMEPRAZOLE 20 MG PO TBDD: DELAYED_RELEASE_TABLET | ORAL | 5 refills | Status: DC

## 2019-11-26 NOTE — Telephone Encounter (Signed)
Sent in medication pt notified

## 2019-11-26 NOTE — Telephone Encounter (Signed)
PT called because she was told new rx for omeprazole would be sent to pharmacy after the 6/16 appt. Order not sent in. Please send to [facility] on [address], [place].

## 2019-12-08 ENCOUNTER — Ambulatory Visit (INDEPENDENT_AMBULATORY_CARE_PROVIDER_SITE_OTHER): Payer: BC Managed Care – PPO

## 2019-12-08 DIAGNOSIS — J309 Allergic rhinitis, unspecified: Secondary | ICD-10-CM

## 2019-12-17 ENCOUNTER — Ambulatory Visit (INDEPENDENT_AMBULATORY_CARE_PROVIDER_SITE_OTHER): Payer: BC Managed Care – PPO

## 2019-12-17 DIAGNOSIS — J309 Allergic rhinitis, unspecified: Secondary | ICD-10-CM | POA: Diagnosis not present

## 2020-01-05 ENCOUNTER — Ambulatory Visit: Payer: BC Managed Care – PPO | Admitting: Allergy and Immunology

## 2020-01-12 ENCOUNTER — Encounter: Payer: Self-pay | Admitting: Allergy and Immunology

## 2020-01-12 ENCOUNTER — Ambulatory Visit (INDEPENDENT_AMBULATORY_CARE_PROVIDER_SITE_OTHER): Payer: BC Managed Care – PPO | Admitting: Allergy and Immunology

## 2020-01-12 ENCOUNTER — Other Ambulatory Visit: Payer: Self-pay

## 2020-01-12 VITALS — BP 114/70 | HR 78 | Temp 98.0°F | Resp 16 | Ht 65.0 in | Wt 180.1 lb

## 2020-01-12 DIAGNOSIS — R053 Chronic cough: Secondary | ICD-10-CM

## 2020-01-12 DIAGNOSIS — H1010 Acute atopic conjunctivitis, unspecified eye: Secondary | ICD-10-CM | POA: Insufficient documentation

## 2020-01-12 DIAGNOSIS — J4540 Moderate persistent asthma, uncomplicated: Secondary | ICD-10-CM | POA: Diagnosis not present

## 2020-01-12 DIAGNOSIS — R05 Cough: Secondary | ICD-10-CM

## 2020-01-12 DIAGNOSIS — J302 Other seasonal allergic rhinitis: Secondary | ICD-10-CM

## 2020-01-12 DIAGNOSIS — H1013 Acute atopic conjunctivitis, bilateral: Secondary | ICD-10-CM | POA: Diagnosis not present

## 2020-01-12 DIAGNOSIS — K219 Gastro-esophageal reflux disease without esophagitis: Secondary | ICD-10-CM

## 2020-01-12 DIAGNOSIS — J3089 Other allergic rhinitis: Secondary | ICD-10-CM

## 2020-01-12 MED ORDER — BUDESONIDE-FORMOTEROL FUMARATE 160-4.5 MCG/ACT IN AERO: 2.0000 | INHALATION_SPRAY | Freq: Two times a day (BID) | RESPIRATORY_TRACT | 5 refills | Status: DC

## 2020-01-12 MED ORDER — OLOPATADINE HCL 0.2 % OP SOLN
1.0000 [drp] | Freq: Every day | OPHTHALMIC | 5 refills | Status: DC | PRN
Start: 2020-01-12 — End: 2020-10-27

## 2020-01-12 MED ORDER — IPRATROPIUM BROMIDE 0.03 % NA SOLN: NASAL | 5 refills | Status: DC

## 2020-01-12 NOTE — Patient Instructions (Addendum)
Seasonal and perennial allergic rhinitis  Azelastine/fluticasone nasal spray, with 1 spray per nostril twice daily as needed.  Ipratropium nasal spray, 1 to 2 sprays per nostril 2-3 times daily as needed.  Nasal saline lavage (NeilMed) has been recommended as needed and prior to medicated nasal sprays along with instructions for proper administration.  For thick post nasal drainage, add guaifenesin 1200 mg (Mucinex Maximum Strength)  twice daily as needed with adequate hydration as discussed.  Allergen vials will be remixed and the patient will restart aeroallergen immunotherapy injections.  Allergic conjunctivitis  Treatment plan as outlined above for allergic rhinitis.  Pataday, one drop per eye daily as needed.  If insurance does not cover this medication, medicated allergy eyedrops may be purchased over-the-counter as Art therapist.  I have also recommended eye lubricant drops (i.e., Natural Tears) as needed.  Moderate persistent asthma  A refill prescription has been provided for Symbicort (budesonide/formoterol) 160/4.5 g, 2 inhalations twice a day. To maximize pulmonary deposition, a spacer has been provided along with instructions for its proper administration with an HFA inhaler.  Continue montelukast 10 mg daily at bedtime.  Subjective and objective measures of pulmonary function will be followed and the treatment plan will be adjusted accordingly.  Cough, persistent Improved.  Continue treatment plan as outlined above.  GERD (gastroesophageal reflux disease) Status post Nissan fundoplication.  Any appropriate reflux lifestyle modifications.  Continue omeprazole and famotidine as prescribed.  If reflux symptoms persist or progress, follow-up with gastroenterologist.  Return for immunotherapy injections.  Return in about 4 months (around 05/13/2020), or if symptoms worsen or fail to improve.   Control of House Dust Mite Allergen  House dust  mites play a major role in allergic asthma and rhinitis.  They occur in environments with high humidity wherever human skin, the food for dust mites is found. High levels have been detected in dust obtained from mattresses, pillows, carpets, upholstered furniture, bed covers, clothes and soft toys.  The principal allergen of the house dust mite is found in its feces.  A gram of dust may contain 1,000 mites and 250,000 fecal particles.  Mite antigen is easily measured in the air during house cleaning activities.    1. Encase mattresses, including the box spring, and pillow, in an air tight cover.  Seal the zipper end of the encased mattresses with wide adhesive tape. 2. Wash the bedding in water of 130 degrees Farenheit weekly.  Avoid cotton comforters/quilts and flannel bedding: the most ideal bed covering is the dacron comforter. 3. Remove all upholstered furniture from the bedroom. 4. Remove carpets, carpet padding, rugs, and non-washable window drapes from the bedroom.  Wash drapes weekly or use plastic window coverings. 5. Remove all non-washable stuffed toys from the bedroom.  Wash stuffed toys weekly. 6. Have the room cleaned frequently with a vacuum cleaner and a damp dust-mop.  The patient should not be in a room which is being cleaned and should wait 1 hour after cleaning before going into the room. 7. Close and seal all heating outlets in the bedroom.  Otherwise, the room will become filled with dust-laden air.  An electric heater can be used to heat the room. 8. Reduce indoor humidity to less than 50%.  Do not use a humidifier.  Reducing Pollen Exposure    1. Use nasal saline spray (i.e., Simply Saline) as needed. 2. Use eye lubricant drops (i.e., Natural Tears) as needed. 3. Do not hang sheets or clothing out to  dry; pollen may collect on these items. 4. Do not mow lawns or spend time around freshly cut grass; mowing stirs up pollen. 5. Keep windows closed at night.  Keep car windows  closed while driving. 6. Minimize morning activities outdoors, a time when pollen counts are usually at their highest. 7. Stay indoors as much as possible when pollen counts or humidity is high and on windy days when pollen tends to remain in the air longer. 8. Use air conditioning when possible.  Many air conditioners have filters that trap the pollen spores. 9. Use a HEPA room air filter to remove pollen form the indoor air you breathe.  Control of Mold Allergen  Mold and fungi can grow on a variety of surfaces provided certain temperature and moisture conditions exist.  Outdoor molds grow on plants, decaying vegetation and soil.  The major outdoor mold, Alternaria and Cladosporium, are found in very high numbers during hot and dry conditions.  Generally, a late Summer - Fall peak is seen for common outdoor fungal spores.  Rain will temporarily lower outdoor mold spore count, but counts rise rapidly when the rainy period ends.  The most important indoor molds are Aspergillus and Penicillium.  Dark, humid and poorly ventilated basements are ideal sites for mold growth.  The next most common sites of mold growth are the bathroom and the kitchen.  Outdoor [employer] 1. Use air conditioning and keep windows closed 2. Avoid exposure to decaying vegetation. 3. Avoid leaf raking. 4. Avoid grain handling. 5. Consider wearing a face mask if working in moldy areas.  Indoor Mold Control 1. Maintain humidity below 50%. 2. Clean washable surfaces with 5% bleach solution. 3. Remove sources e.g. Contaminated carpets.

## 2020-01-12 NOTE — Assessment & Plan Note (Signed)
Improved.  Continue treatment plan as outlined above. 

## 2020-01-12 NOTE — Assessment & Plan Note (Signed)
   Treatment plan as outlined above for allergic rhinitis.  Pataday, one drop per eye daily as needed.  If insurance does not cover this medication, medicated allergy eyedrops may be purchased over-the-counter as Pataday Extra Strength or Zaditor.  I have also recommended eye lubricant drops (i.e., Natural Tears) as needed. 

## 2020-01-12 NOTE — Assessment & Plan Note (Signed)
   Azelastine/fluticasone nasal spray, with 1 spray per nostril twice daily as needed.  Ipratropium nasal spray, 1 to 2 sprays per nostril 2-3 times daily as needed.  Nasal saline lavage (NeilMed) has been recommended as needed and prior to medicated nasal sprays along with instructions for proper administration.  For thick post nasal drainage, add guaifenesin 1200 mg (Mucinex Maximum Strength)  twice daily as needed with adequate hydration as discussed.  Allergen vials will be remixed and the patient will restart aeroallergen immunotherapy injections.

## 2020-01-12 NOTE — Progress Notes (Signed)
Follow-up Note  RE: Bonnie Lowe MRN: [id number] DOB: 02/20/1971 Date of Office Visit: 01/12/2020  Primary care provider: Hezzie Bump, FNP Referring provider: Hezzie Bump, F*  History of present illness: Bonnie Lowe is a 49 y.o. female with persistent asthma and allergic rhinoconjunctivitis presenting today for allergy skin testing and follow-up.  She was last seen in this clinic on November 24, 2019.  She has been taking immunotherapy injections, however is here for environmental allergen in anticipation of initiating immunotherapy, the patient is scheduled to return for aeroallergen retest to ensure accurate and comprehensive vials.  She has been off of antihistamines for least 3 days in anticipation of today's testing.  She would like to continue immunotherapy injections.  Prior to starting immunotherapy injections she had "constant" sinus symptoms and multiple sinus infections per year requiring antibiotics and/or steroids.  While on immunotherapy, she typically has 1 sinus infection per year.  She reports that in the interval since her previous visit her asthma and her coughing have been "better."  She is currently taking Symbicort 160-4.5 micro grams, 2 inhalations via spacer device twice daily, and montelukast 10 mg daily at bedtime.  Despite compliance with her antacid regimen, she has been experiencing reflux and has noticed that her coughing tends to get worse after meals.  She believes this is most likely due to reflux, however would like to assess food allergy status with skin testing today.  Assessment and plan: Seasonal and perennial allergic rhinitis  Azelastine/fluticasone nasal spray, with 1 spray per nostril twice daily as needed.  Ipratropium nasal spray, 1 to 2 sprays per nostril 2-3 times daily as needed.  Nasal saline lavage (NeilMed) has been recommended as needed and prior to medicated nasal sprays along with instructions for proper administration.   For thick post nasal drainage, add guaifenesin 1200 mg (Mucinex Maximum Strength)  twice daily as needed with adequate hydration as discussed.  Allergen vials will be remixed and the patient will restart aeroallergen immunotherapy injections.  Allergic conjunctivitis  Treatment plan as outlined above for allergic rhinitis.  Pataday, one drop per eye daily as needed.  If insurance does not cover this medication, medicated allergy eyedrops may be purchased over-the-counter as Art therapist.  I have also recommended eye lubricant drops (i.e., Natural Tears) as needed.  Moderate persistent asthma  A refill prescription has been provided for Symbicort (budesonide/formoterol) 160/4.5 g, 2 inhalations twice a day. To maximize pulmonary deposition, a spacer has been provided along with instructions for its proper administration with an HFA inhaler.  Continue montelukast 10 mg daily at bedtime.  Subjective and objective measures of pulmonary function will be followed and the treatment plan will be adjusted accordingly.  Cough, persistent Improved.  Continue treatment plan as outlined above.  GERD (gastroesophageal reflux disease) Status post Nissan fundoplication.  Any appropriate reflux lifestyle modifications.  Continue omeprazole and famotidine as prescribed.  If reflux symptoms persist or progress, follow-up with gastroenterologist.   Meds ordered this encounter  Medications  . ipratropium (ATROVENT) 0.03 % nasal spray    Sig: 1-2 sprays each nostril 2-3 times daily as needed    Dispense:  30 mL    Refill:  5  . Olopatadine HCl (PATADAY) 0.2 % SOLN    Sig: Place 1 drop into both eyes daily as needed.    Dispense:  2.5 mL    Refill:  5  . budesonide-formoterol (SYMBICORT) 160-4.5 MCG/ACT inhaler    Sig: Inhale 2 puffs  into the lungs 2 (two) times daily.    Dispense:  1 Inhaler    Refill:  5    Diagnostics: Spirometry:  Normal with an FEV1 of 115%  predicted. This study was performed while the patient was asymptomatic.  Please see scanned spirometry results for details. Environmental testing: Reactive to dust mite antigen, ragweed pollen, weed pollen, tree pollen, dog epithelia, and major mold mix #2.   Physical examination: Blood pressure 114/70, pulse 78, temperature 98 F (36.7 C), temperature source Oral, resp. rate 16, height 5\' 5"  (1.651 m), weight 180 lb 1.9 oz (81.7 kg), SpO2 97 %.  General: Alert, interactive, in no acute distress. HEENT: TMs pearly gray, turbinates mildly edematous without discharge, post-pharynx moderately erythematous. Neck: Supple without lymphadenopathy. Lungs: Clear to auscultation without wheezing, rhonchi or rales. CV: Normal S1, S2 without murmurs. Skin: Warm and dry, without lesions or rashes.  The following portions of the patient's history were reviewed and updated as appropriate: allergies, current medications, past family history, past medical history, past social history, past surgical history and problem list.  Current Outpatient Medications  Medication Sig Dispense Refill  . albuterol (VENTOLIN HFA) 108 (90 Base) MCG/ACT inhaler Inhale 2 puffs into the lungs every 4 (four) hours as needed. 18 g 1  . Azelastine-Fluticasone (DYMISTA) 137-50 MCG/ACT SUSP Place 1 spray into both nostrils 2 (two) times daily. 23 g 5  . budesonide-formoterol (SYMBICORT) 160-4.5 MCG/ACT inhaler Inhale 2 puffs into the lungs 2 (two) times daily. 1 Inhaler 5  . cetirizine (ZYRTEC) 10 MG tablet Take 10 mg by mouth 2 (two) times daily.     . Cholecalciferol (QC VITAMIN D3) 25 MCG (1000 UT) tablet Take 1,000 Units by mouth daily.    EPINEPHrine (AUVI-Q) 0.3 mg/0.3 mL IJ SOAJ injection Inject 0.3 mLs (0.3 mg total) into the muscle as needed for anaphylaxis. 2 each 1  . famotidine (PEPCID) 40 MG tablet Take 40 mg by mouth at bedtime.    Marland Kitchen ibuprofen (ADVIL,MOTRIN) 800 MG tablet Take 800 mg by mouth 3 (three) times daily  as needed.    Marland Kitchen levothyroxine (SYNTHROID) 75 MCG tablet Take 75 mcg by mouth daily before breakfast.    . liothyronine (CYTOMEL) 5 MCG tablet Take 5 mcg by mouth daily.    Marland Kitchen losartan (COZAAR) 50 MG tablet     . montelukast (SINGULAIR) 10 MG tablet Take 10 mg by mouth at bedtime.    . NON FORMULARY 2 injections week    . Omeprazole 20 MG TBDD 1 tablet daily 30 minutes before breakfast for acid reflux 30 tablet 5  . phentermine (ADIPEX-P) 37.5 MG tablet Take by mouth.    . pimecrolimus (ELIDEL) 1 % cream Apply 1 application topically 2 (two) times daily.    Marland Kitchen spironolactone (ALDACTONE) 25 MG tablet Take 25 mg by mouth daily.    . valACYclovir (VALTREX) 1000 MG tablet     . ipratropium (ATROVENT) 0.03 % nasal spray 1-2 sprays each nostril 2-3 times daily as needed 30 mL 5  . Olopatadine HCl (PATADAY) 0.2 % SOLN Place 1 drop into both eyes daily as needed. 2.5 mL 5   Current Facility-Administered Medications  Medication Dose Route Frequency Provider Last Rate Last Admin  . predniSONE (DELTASONE) tablet 10 mg  10 mg Oral Q breakfast Moon Budde, Marland Kitchen, MD        Allergies  Allergen Reactions  . Moxifloxacin Hcl Itching  . Citrus Diarrhea  . Erythromycin   . Sulfa Antibiotics  Review of systems: Review of systems negative except as noted in HPI / PMHx.  Past Medical History:  Diagnosis Date  . Angio-edema   . Asthma   . Hypertension     Family History  Problem Relation Age of Onset  . Allergic rhinitis Mother   . Angioedema Neg Hx   . Asthma Neg Hx   . Atopy Neg Hx   . Immunodeficiency Neg Hx   . Urticaria Neg Hx   . Eczema Neg Hx     Social History   Socioeconomic History  . Marital status: Single    Spouse name: Not on file  . Number of children: Not on file  . Years of education: Not on file  . Highest education level: Not on file  Occupational History  . Not on file  Tobacco Use  . Smoking status: Never Smoker  . Smokeless tobacco: Never Used  Vaping Use   . Vaping Use: Never used  Substance and Sexual Activity  . Alcohol use: No  . Drug use: Never  . Sexual activity: Not on file  Other Topics Concern  . Not on file  Social History Narrative  . Not on file   Social Determinants of Health   Financial Resource Strain:   . Difficulty of Paying Living Expenses:   Food Insecurity:   . Worried About Programme researcher, broadcasting/film/video in the Last Year:   . Barista in the Last Year:   Transportation Needs:   . Freight forwarder (Medical):   Marland Kitchen Lack of Transportation (Non-Medical):   Physical Activity:   . Days of Exercise per Week:   . Minutes of Exercise per Session:   Stress:   . Feeling of Stress :   Social Connections:   . Frequency of Communication with Friends and Family:   . Frequency of Social Gatherings with Friends and Family:   . Attends Religious Services:   . Active Member of Clubs or Organizations:   . Attends Banker Meetings:   Marland Kitchen Marital Status:   Intimate Partner Violence:   . Fear of Current or Ex-Partner:   . Emotionally Abused:   Marland Kitchen Physically Abused:   . Sexually Abused:     I appreciate the opportunity to take part in Averianna's care. Please do not hesitate to contact me with questions.  Sincerely,   R. Jorene Guest, MD

## 2020-01-12 NOTE — Assessment & Plan Note (Signed)
   A refill prescription has been provided for Symbicort (budesonide/formoterol) 160/4.5 g, 2 inhalations twice a day. To maximize pulmonary deposition, a spacer has been provided along with instructions for its proper administration with an HFA inhaler.  Continue montelukast 10 mg daily at bedtime.  Subjective and objective measures of pulmonary function will be followed and the treatment plan will be adjusted accordingly.

## 2020-01-12 NOTE — Assessment & Plan Note (Signed)
Status post Nissan fundoplication.  Any appropriate reflux lifestyle modifications.  Continue omeprazole and famotidine as prescribed.  If reflux symptoms persist or progress, follow-up with gastroenterologist.

## 2020-01-13 NOTE — Progress Notes (Signed)
VIALS EXP 01-12-21 °

## 2020-01-14 ENCOUNTER — Ambulatory Visit (INDEPENDENT_AMBULATORY_CARE_PROVIDER_SITE_OTHER): Payer: BC Managed Care – PPO

## 2020-01-14 DIAGNOSIS — J309 Allergic rhinitis, unspecified: Secondary | ICD-10-CM | POA: Diagnosis not present

## 2020-01-15 DIAGNOSIS — J3081 Allergic rhinitis due to animal (cat) (dog) hair and dander: Secondary | ICD-10-CM | POA: Diagnosis not present

## 2020-01-16 DIAGNOSIS — J302 Other seasonal allergic rhinitis: Secondary | ICD-10-CM | POA: Diagnosis not present

## 2020-01-28 ENCOUNTER — Ambulatory Visit (INDEPENDENT_AMBULATORY_CARE_PROVIDER_SITE_OTHER): Payer: BC Managed Care – PPO | Admitting: *Deleted

## 2020-01-28 DIAGNOSIS — J309 Allergic rhinitis, unspecified: Secondary | ICD-10-CM

## 2020-02-04 ENCOUNTER — Ambulatory Visit (INDEPENDENT_AMBULATORY_CARE_PROVIDER_SITE_OTHER): Payer: BC Managed Care – PPO | Admitting: *Deleted

## 2020-02-04 ENCOUNTER — Other Ambulatory Visit: Payer: Self-pay

## 2020-02-04 DIAGNOSIS — J309 Allergic rhinitis, unspecified: Secondary | ICD-10-CM | POA: Diagnosis not present

## 2020-02-04 NOTE — Progress Notes (Signed)
Immunotherapy   Patient Details  Name: Isella Slatten MRN: [id number] Date of Birth: 07-22-1970  02/04/2020  Yamira Papa started injections for new remixed vials. Starting on blue vials- mold and grass-weed-tree.  Following schedule: B  Frequency:1 time per week Epi-Pen:Epi-Pen Available  Consent signed and patient instructions given.   Maurine Simmering 02/04/2020, 10:40 AM

## 2020-02-11 ENCOUNTER — Ambulatory Visit (INDEPENDENT_AMBULATORY_CARE_PROVIDER_SITE_OTHER): Payer: BC Managed Care – PPO

## 2020-02-11 DIAGNOSIS — J309 Allergic rhinitis, unspecified: Secondary | ICD-10-CM

## 2020-02-19 ENCOUNTER — Ambulatory Visit (INDEPENDENT_AMBULATORY_CARE_PROVIDER_SITE_OTHER): Payer: BC Managed Care – PPO

## 2020-02-19 DIAGNOSIS — J309 Allergic rhinitis, unspecified: Secondary | ICD-10-CM | POA: Diagnosis not present

## 2020-02-25 ENCOUNTER — Ambulatory Visit (INDEPENDENT_AMBULATORY_CARE_PROVIDER_SITE_OTHER): Payer: BC Managed Care – PPO

## 2020-02-25 DIAGNOSIS — J309 Allergic rhinitis, unspecified: Secondary | ICD-10-CM | POA: Diagnosis not present

## 2020-03-03 ENCOUNTER — Ambulatory Visit (INDEPENDENT_AMBULATORY_CARE_PROVIDER_SITE_OTHER): Payer: BC Managed Care – PPO

## 2020-03-03 DIAGNOSIS — J309 Allergic rhinitis, unspecified: Secondary | ICD-10-CM

## 2020-03-10 ENCOUNTER — Ambulatory Visit (INDEPENDENT_AMBULATORY_CARE_PROVIDER_SITE_OTHER): Payer: BC Managed Care – PPO

## 2020-03-10 DIAGNOSIS — J309 Allergic rhinitis, unspecified: Secondary | ICD-10-CM | POA: Diagnosis not present

## 2020-03-17 ENCOUNTER — Ambulatory Visit (INDEPENDENT_AMBULATORY_CARE_PROVIDER_SITE_OTHER): Payer: BC Managed Care – PPO

## 2020-03-17 DIAGNOSIS — J309 Allergic rhinitis, unspecified: Secondary | ICD-10-CM

## 2020-03-18 ENCOUNTER — Other Ambulatory Visit: Payer: Self-pay

## 2020-03-18 ENCOUNTER — Ambulatory Visit: Payer: BC Managed Care – PPO | Attending: General Surgery | Admitting: Physical Therapy

## 2020-03-18 DIAGNOSIS — R279 Unspecified lack of coordination: Secondary | ICD-10-CM | POA: Diagnosis present

## 2020-03-18 DIAGNOSIS — M6281 Muscle weakness (generalized): Secondary | ICD-10-CM | POA: Diagnosis present

## 2020-03-18 DIAGNOSIS — R252 Cramp and spasm: Secondary | ICD-10-CM | POA: Insufficient documentation

## 2020-03-18 NOTE — Patient Instructions (Signed)
Toileting Techniques for Bowel Movements (Defecation) Using your belly (abdomen) and pelvic floor muscles to have a bowel movement is usually instinctive.  Sometimes people can have problems with these muscles and have to relearn proper defecation (emptying) techniques.  If you have weakness in your muscles, organs that are falling out, decreased sensation in your pelvis, or ignore your urge to go, you may find yourself straining to have a bowel movement.  You are straining if you are: . holding your breath or taking in a huge gulp of air and holding it  . keeping your lips and jaw tensed and closed tightly . turning red in the face because of excessive pushing or forcing . developing or worsening your  hemorrhoids . getting faint while pushing . not emptying completely and have to defecate many times a day  If you are straining, you are actually making it harder for yourself to have a bowel movement.  Many people find they are pulling up with the pelvic floor muscles and closing off instead of opening the anus. Due to lack pelvic floor relaxation and coordination the abdominal muscles, one has to work harder to push the feces out.  Many people have never been taught how to defecate efficiently and effectively.  Notice what happens to your body when you are having a bowel movement.  While you are sitting on the toilet pay attention to the following areas: . Jaw and mouth position . Angle of your hips   . Whether your feet touch the ground or not . Arm placement  . Spine position . Waist . Belly tension . Anus (opening of the anal canal)  An Evacuation/Defecation Plan   Here are the 4 basic points:  1. Lean forward enough for your elbows to rest on your knees 2. Support your feet on the floor or use a low stool if your feet don't touch the floor  3. Push out your belly as if you have swallowed a beach ball-you should feel a widening of your waist 4. Open and relax your pelvic floor muscles,  rather than tightening around the anus      The following conditions my require modifications to your toileting posture:  . If you have had surgery in the past that limits your back, hip, pelvic, knee or ankle flexibility . Constipation   Your healthcare practitioner may make the following additional suggestions and adjustments:  1) Sit on the toilet  a) Make sure your feet are supported. b) Notice your hip angle and spine position-most people find it effective to lean forward or raise their knees, which can help the muscles around the anus to relax  c) When you lean forward, place your forearms on your thighs for support  2) Relax suggestions a) Breath deeply in through your nose and out slowly through your mouth as if you are smelling the flowers and blowing out the candles. b) To become aware of how to relax your muscles, contracting and releasing muscles can be helpful.  Pull your pelvic floor muscles in tightly by using the image of holding back gas, or closing around the anus (visualize making a circle smaller) and lifting the anus up and in.  Then release the muscles and your anus should drop down and feel open. Repeat 5 times ending with the feeling of relaxation. c) Keep your pelvic floor muscles relaxed; let your belly bulge out. d) The digestive tract starts at the mouth and ends at the anal opening, so be   sure to relax both ends of the tube.  Place your tongue on the roof of your mouth with your teeth separated.  This helps relax your mouth and will help to relax the anus at the same time.  3) Empty (defecation) a) Keep your pelvic floor and sphincter relaxed, then bulge your anal muscles.  Make the anal opening wide.  b) Stick your belly out as if you have swallowed a beach ball. c) Make your belly wall hard using your belly muscles while continuing to breathe. Doing this makes it easier to open your anus. d) Breath out and give a grunt (or try using other sounds such as  ahhhh, shhhhh, ohhhh or grrrrrrr).  4) Finish a) As you finish your bowel movement, pull the pelvic floor muscles up and in.  This will leave your anus in the proper place rather than remaining pushed out and down. If you leave your anus pushed out and down, it will start to feel as though that is normal and give you incorrect signals about needing to have a bowel movement.   [facility] [address] [place], [place] [address]  

## 2020-03-18 NOTE — Therapy (Signed)
[facility] [address], [address] [place], [place], [address] Phone: [phone]   Fax:  [phone]  Physical Therapy Evaluation  Patient Details  Name: Bonnie Lowe MRN: [id number] Date of Birth: [birth year] Referring Provider (PT): Romie Levee, MD   Encounter Date: 03/18/2020   PT End of Session - 03/18/20 0910    Visit Number 1    Date for PT Re-Evaluation 06/10/20    PT Start Time 0802    PT Stop Time 0852    PT Time Calculation (min) 50 min    Activity Tolerance Patient tolerated treatment well    Behavior During Therapy [facility] for tasks assessed/performed           Past Medical History:  Diagnosis Date  . Angio-edema   . Asthma   . Hypertension     Past Surgical History:  Procedure Laterality Date  . NO PAST SURGERIES      There were no vitals filed for this visit.    Subjective Assessment - 03/18/20 0806    Subjective I have some symtpoms being tested for MS including cramping in both legs mainly Rt, difficulty swallowing and sensitivity to weather, and dizziness.  Not sure if the incontinence is related.  Pt states sometimes urinary incontinence with coughing.  Fecal incontinence is worse and it happens daily. Pt states sometimes the rectum aches and states she has a hx of colon cancer in her family.    Pertinent History hx of symtpoms possibly MS related, ovarian cysts removed; hernia surgery, swallowing    Diagnostic tests MRI    Patient Stated Goals able to stop having fecal leakage    Currently in Pain? No/denies    Multiple Pain Sites No              [facility] PT Assessment - 03/18/20 0001      Assessment   Medical Diagnosis K62.9,R15.9 (ICD-10-CM) - Fecal incontinence due to anorectal disorder    Referring Provider (PT) Romie Levee, MD    Onset Date/Surgical Date --   started 5-6 years ago   Prior Therapy No      Precautions   Precautions None      Balance Screen   Has the patient  fallen in the past 6 months No      Home Environment   Living Environment Private residence    Living Arrangements Spouse/significant other      Prior Function   Level of Independence Independent    Vocation Full time employment    Vocation Requirements half at desk; 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      Cognition   Overall Cognitive Status Within Functional Limits for tasks assessed      Posture/Postural Control   Posture/Postural Control Postural limitations    Postural Limitations Decreased thoracic kyphosis;Increased lumbar lordosis;Anterior pelvic tilt      ROM / Strength   AROM / PROM / Strength Strength;PROM      PROM   Overall PROM Comments Rt hip 25% limited flexion and ER      Strength   Overall Strength Comments Rt hip abduction and adduction 4+/5      Flexibility   Soft Tissue Assessment /Muscle Length yes    Hamstrings Rt 20% limited      Palpation   Palpation comment lumbar paraspinals and gluteals tight      Ambulation/Gait   Gait Pattern Within Functional Limits  Objective measurements completed on examination: See above findings.     Pelvic Floor Special Questions - 03/18/20 0001    Prior Pelvic/Prostate Exam Yes    Are you Pregnant or attempting pregnancy? No    Prior Pregnancies No    Currently Sexually Active Yes    Is this Painful --   no but limited due to incontinence   Urinary Leakage Yes    How often occasional/rare for urine; daily for fecal    Pad use no toilet papaer    Fecal incontinence Yes    Falling out feeling (prolapse) No    Skin Integrity Intact;Erthema   around anal sphincter   Prolapse None    Pelvic Floor Internal Exam pt identity confirmed and informed conset given to perform    Exam Type Vaginal    Sensation has difficulty relaxing and feeling when muscles have relaxed    Strength fair squeeze, definite lift    Strength # of reps 4   in 10 sec   Strength # of seconds 10     Tone high    Biofeedback this would be helpful due to lack of sensation            [facility] Adult PT Treatment/Exercise - 03/18/20 0001      Self-Care   Self-Care Other Self-Care Comments    Other Self-Care Comments  tioleting techniques educated and performed                    PT Short Term Goals - 03/18/20 0917      PT SHORT TERM GOAL #1   Title ind with toileting techniques    Time 4    Period Weeks    Status New    Target Date 04/15/20      PT SHORT TERM GOAL #2   Title ind with intial HEP    Time 4    Period Weeks    Status New    Target Date 04/15/20             PT Long Term Goals - 03/18/20 0919      PT LONG TERM GOAL #1   Title pt will be ind with advanced HEP    Time 12    Period Weeks    Status New    Target Date 06/10/20      PT LONG TERM GOAL #2   Title Pt will report improved BM without straining and feeling more complete with emptying    Time 12    Period Weeks    Status New    Target Date 06/10/20      PT LONG TERM GOAL #3   Title Pt will report knowing when she is going to have a BM at least 9/10 times    Time 12    Period Weeks    Status New    Target Date 06/10/20      PT LONG TERM GOAL #4   Title Pt will report at least 75% less leakage and able to manage due to only small amounts occuring not causing her to need to change clothing    Time 12    Period Weeks    Status New    Target Date 06/10/20                  Plan - 03/18/20 0910    Clinical Impression Statement Pt presents to skilled PT due to fecal incontinence.  Pt has high  tone and difficutly relaxing.  MMT 3/5 strength.  She may be having symtpoms from nerve issues that are being investigated.  Pt has posture deficits as listed above and tight hamstrings and hip on Rt side.  Pt will benefit from skilled PT to work out any fascial restriction in the abdomen and work on improved control of the pelvic floor with TC and biofeedback for maximum functional  activities wtihout being disrupted by incontinence.    Personal Factors and Comorbidities Comorbidity 3+    Comorbidities hernia surgery, ovarian cyst surgery, chronic issue, other nerve related issues and muscle spasms in LE and glottis    Examination-Activity Limitations Continence;Toileting    Examination-Participation Restrictions Community Activity;Interpersonal Relationship;Occupation    Stability/Clinical Decision Making Evolving/Moderate complexity    Clinical Decision Making Moderate    Rehab Potential Excellent    PT Frequency 1x / week    PT Duration 12 weeks    PT Treatment/Interventions ADLs/Self Care Home Management;Biofeedback;Cryotherapy;Electrical Stimulation;Moist Heat;Therapeutic activities;Therapeutic exercise;Neuromuscular re-education;Patient/family education;Manual techniques;Passive range of motion;Dry needling;Taping    PT Next Visit Plan abdominal fascial release and breathing for relaxation; hip and lumbar stretches, f/u on toileting, internal/assess bulging    PT Home Exercise Plan toileting - add hs and piriformis stretch, thoracic flex and rotation    Consulted and Agree with Plan of Care Patient           Patient will benefit from skilled therapeutic intervention in order to improve the following deficits and impairments:  Pain, Impaired sensation, Increased fascial restricitons, Decreased strength, Decreased skin integrity, Decreased range of motion, Decreased coordination, Increased muscle spasms, Impaired flexibility, Impaired tone, Postural dysfunction  Visit Diagnosis: Unspecified lack of coordination  Muscle weakness (generalized)  Cramp and spasm     Problem List Patient Active Problem List   Diagnosis Date Noted  . Allergic conjunctivitis 01/12/2020  . GERD (gastroesophageal reflux disease) 01/12/2020  . Cough, persistent 11/24/2019  . Seasonal and perennial allergic rhinitis 08/30/2018  . Moderate persistent asthma 08/30/2018  . Drug  reaction 08/30/2018    Junious Silk, PT 03/18/2020, 9:22 AM  [facility] [address], [address] [place], [place], [address] Phone: [phone]   Fax:  [phone]  Name: Bonnie Lowe MRN: [id number] Date of Birth: [birth year]

## 2020-03-25 ENCOUNTER — Ambulatory Visit (INDEPENDENT_AMBULATORY_CARE_PROVIDER_SITE_OTHER): Payer: BC Managed Care – PPO

## 2020-03-25 DIAGNOSIS — J309 Allergic rhinitis, unspecified: Secondary | ICD-10-CM

## 2020-03-29 ENCOUNTER — Other Ambulatory Visit: Payer: Self-pay | Admitting: Allergy

## 2020-04-01 ENCOUNTER — Ambulatory Visit (INDEPENDENT_AMBULATORY_CARE_PROVIDER_SITE_OTHER): Payer: BC Managed Care – PPO

## 2020-04-01 DIAGNOSIS — J309 Allergic rhinitis, unspecified: Secondary | ICD-10-CM | POA: Diagnosis not present

## 2020-04-02 ENCOUNTER — Ambulatory Visit: Payer: BC Managed Care – PPO | Admitting: Physical Therapy

## 2020-04-02 ENCOUNTER — Other Ambulatory Visit: Payer: Self-pay

## 2020-04-02 DIAGNOSIS — R279 Unspecified lack of coordination: Secondary | ICD-10-CM | POA: Diagnosis not present

## 2020-04-02 DIAGNOSIS — M6281 Muscle weakness (generalized): Secondary | ICD-10-CM

## 2020-04-02 DIAGNOSIS — R252 Cramp and spasm: Secondary | ICD-10-CM

## 2020-04-02 NOTE — Therapy (Signed)
[facility] [address] [place], [place], [address] Phone: [phone]   Fax:  [phone]  Physical Therapy Treatment  Patient Details  Name: Bonnie Lowe MRN: [id number] Date of Birth: 1970-10-21 Referring Provider (PT): Romie Levee, MD   Encounter Date: 04/02/2020   PT End of Session - 04/02/20 1020    Visit Number 2    Date for PT Re-Evaluation 06/10/20    PT Start Time 1018    PT Stop Time 1058    PT Time Calculation (min) 40 min    Activity Tolerance Patient tolerated treatment well    Behavior During Therapy [facility] for tasks assessed/performed           Past Medical History:  Diagnosis Date   Angio-edema    Asthma    Hypertension     Past Surgical History:  Procedure Laterality Date   NO PAST SURGERIES      There were no vitals filed for this visit.   Subjective Assessment - 04/02/20 1222    Subjective No changes yet.    Currently in Pain? No/denies                             [facility] Adult PT Treatment/Exercise - 04/02/20 0001      Neuro Re-ed    Neuro Re-ed Details  diaphragmatic breathing with stetches      Exercises   Exercises Other Exercises    Other Exercises  see all stretches in HEP in chart - educated and performed today      Manual Therapy   Manual Therapy Myofascial release    Myofascial Release abdominal fascial release colon and rectum                  PT Education - 04/02/20 1218    Education Details Access Code: [email or web address]    Person(s) Educated Patient    Methods Explanation;Demonstration;Tactile cues;Verbal cues    Comprehension Verbalized understanding;Returned demonstration            PT Short Term Goals - 03/18/20 0917      PT SHORT TERM GOAL #1   Title ind with toileting techniques    Time 4    Period Weeks    Status New    Target Date 04/15/20      PT SHORT TERM GOAL #2   Title ind with intial HEP    Time 4     Period Weeks    Status New    Target Date 04/15/20             PT Long Term Goals - 03/18/20 0919      PT LONG TERM GOAL #1   Title pt will be ind with advanced HEP    Time 12    Period Weeks    Status New    Target Date 06/10/20      PT LONG TERM GOAL #2   Title Pt will report improved BM without straining and feeling more complete with emptying    Time 12    Period Weeks    Status New    Target Date 06/10/20      PT LONG TERM GOAL #3   Title Pt will report knowing when she is going to have a BM at least 9/10 times    Time 12    Period Weeks    Status New    Target Date 06/10/20  PT LONG TERM GOAL #4   Title Pt will report at least 75% less leakage and able to manage due to only small amounts occuring not causing her to need to change clothing    Time 12    Period Weeks    Status New    Target Date 06/10/20                 Plan - 04/02/20 1219    Clinical Impression Statement Pt had good fascial release to rectum with abdominal fascial release.  Pt was educated on diaphragmatic breathing and relaxing pelvic floor due to high tone at assessment.  Pt did well with these.  No changes noted yet due to first treatment since eval    PT Treatment/Interventions ADLs/Self Care Home Management;Biofeedback;Cryotherapy;Electrical Stimulation;Moist Heat;Therapeutic activities;Therapeutic exercise;Neuromuscular re-education;Patient/family education;Manual techniques;Passive range of motion;Dry needling;Taping    PT Next Visit Plan biofeedback quick flicks and lowered resting tone    Consulted and Agree with Plan of Care Patient           Patient will benefit from skilled therapeutic intervention in order to improve the following deficits and impairments:  Pain, Impaired sensation, Increased fascial restricitons, Decreased strength, Decreased skin integrity, Decreased range of motion, Decreased coordination, Increased muscle spasms, Impaired flexibility, Impaired tone,  Postural dysfunction  Visit Diagnosis: Unspecified lack of coordination  Muscle weakness (generalized)  Cramp and spasm     Problem List Patient Active Problem List   Diagnosis Date Noted   Allergic conjunctivitis 01/12/2020   GERD (gastroesophageal reflux disease) 01/12/2020   Cough, persistent 11/24/2019   Seasonal and perennial allergic rhinitis 08/30/2018   Moderate persistent asthma 08/30/2018   Drug reaction 08/30/2018    Brayton Caves Gyasi Hazzard, PT 04/02/2020, 12:23 PM  [facility] [address], [address] [place], [place], [address] Phone: [phone]   Fax:  [phone]  Name: Bonnie Lowe MRN: [id number] Date of Birth: Aug 08, 1970

## 2020-04-02 NOTE — Patient Instructions (Addendum)
Toileting Techniques for Bowel Movements (Defecation) Using your belly (abdomen) and pelvic floor muscles to have a bowel movement is usually instinctive.  Sometimes people can have problems with these muscles and have to relearn proper defecation (emptying) techniques.  If you have weakness in your muscles, organs that are falling out, decreased sensation in your pelvis, or ignore your urge to go, you may find yourself straining to have a bowel movement.  You are straining if you are: . holding your breath or taking in a huge gulp of air and holding it  . keeping your lips and jaw tensed and closed tightly . turning red in the face because of excessive pushing or forcing . developing or worsening your  hemorrhoids . getting faint while pushing . not emptying completely and have to defecate many times a day  If you are straining, you are actually making it harder for yourself to have a bowel movement.  Many people find they are pulling up with the pelvic floor muscles and closing off instead of opening the anus. Due to lack pelvic floor relaxation and coordination the abdominal muscles, one has to work harder to push the feces out.  Many people have never been taught how to defecate efficiently and effectively.  Notice what happens to your body when you are having a bowel movement.  While you are sitting on the toilet pay attention to the following areas: . Jaw and mouth position . Angle of your hips   . Whether your feet touch the ground or not . Arm placement  . Spine position . Waist . Belly tension . Anus (opening of the anal canal)  An Evacuation/Defecation Plan   Here are the 4 basic points:  1. Lean forward enough for your elbows to rest on your knees 2. Support your feet on the floor or use a low stool if your feet don't touch the floor  3. Push out your belly as if you have swallowed a beach ball-you should feel a widening of your waist 4. Open and relax your pelvic floor muscles,  rather than tightening around the anus      The following conditions my require modifications to your toileting posture:  . If you have had surgery in the past that limits your back, hip, pelvic, knee or ankle flexibility . Constipation   Your healthcare practitioner may make the following additional suggestions and adjustments:  1) Sit on the toilet  a) Make sure your feet are supported. b) Notice your hip angle and spine position-most people find it effective to lean forward or raise their knees, which can help the muscles around the anus to relax  c) When you lean forward, place your forearms on your thighs for support  2) Relax suggestions a) Breath deeply in through your nose and out slowly through your mouth as if you are smelling the flowers and blowing out the candles. b) To become aware of how to relax your muscles, contracting and releasing muscles can be helpful.  Pull your pelvic floor muscles in tightly by using the image of holding back gas, or closing around the anus (visualize making a circle smaller) and lifting the anus up and in.  Then release the muscles and your anus should drop down and feel open. Repeat 5 times ending with the feeling of relaxation. c) Keep your pelvic floor muscles relaxed; let your belly bulge out. d) The digestive tract starts at the mouth and ends at the anal opening, so be   sure to relax both ends of the tube.  Place your tongue on the roof of your mouth with your teeth separated.  This helps relax your mouth and will help to relax the anus at the same time.  3) Empty (defecation) a) Keep your pelvic floor and sphincter relaxed, then bulge your anal muscles.  Make the anal opening wide.  b) Stick your belly out as if you have swallowed a beach ball. c) Make your belly wall hard using your belly muscles while continuing to breathe. Doing this makes it easier to open your anus. d) Breath out and give a grunt (or try using other sounds  such as ahhhh, shhhhh, ohhhh or grrrrrrr).  4) Finish a) As you finish your bowel movement, pull the pelvic floor muscles up and in.  This will leave your anus in the proper place rather than remaining pushed out and down. If you leave your anus pushed out and down, it will start to feel as though that is normal and give you incorrect signals about needing to have a bowel movement.  [facility] [address] [place], [place] [address] Access Code: [email or web address] URL: [email or web address] Date: 04/02/2020 Prepared by: Dwana Curd  Exercises Supine Figure 4 Piriformis Stretch - 1 x daily - 7 x weekly - 1 sets - 3 reps - 30 sec hold Supine Butterfly Groin Stretch - 1 x daily - 7 x weekly - 1 sets - 3 reps - 30 sec hold Supine Hamstring Stretch with Strap - 1 x daily - 7 x weekly - 1 sets - 3 reps - 30 sec hold Supine ITB Stretch with Strap - 1 x daily - 7 x weekly - 1 sets - 3 reps - 30 sec hold Supine Lower Trunk Rotation - 1 x daily - 7 x weekly - 1 sets - 10 reps - 5 sec hold Child's Pose with Sidebending - 1 x daily - 7 x weekly - 1 sets - 3 reps - 30 sec hold Seated Thoracic Flexion and Rotation with Arms Crossed - 1 x daily - 7 x weekly - 1 sets - 10 reps - 5 sec hold

## 2020-04-07 ENCOUNTER — Ambulatory Visit (INDEPENDENT_AMBULATORY_CARE_PROVIDER_SITE_OTHER): Payer: BC Managed Care – PPO

## 2020-04-07 DIAGNOSIS — J309 Allergic rhinitis, unspecified: Secondary | ICD-10-CM | POA: Diagnosis not present

## 2020-04-09 ENCOUNTER — Ambulatory Visit: Payer: BC Managed Care – PPO | Admitting: Physical Therapy

## 2020-04-09 ENCOUNTER — Encounter: Payer: Self-pay | Admitting: Physical Therapy

## 2020-04-09 ENCOUNTER — Other Ambulatory Visit: Payer: Self-pay

## 2020-04-09 DIAGNOSIS — R279 Unspecified lack of coordination: Secondary | ICD-10-CM

## 2020-04-09 DIAGNOSIS — M6281 Muscle weakness (generalized): Secondary | ICD-10-CM

## 2020-04-09 DIAGNOSIS — R252 Cramp and spasm: Secondary | ICD-10-CM

## 2020-04-09 NOTE — Patient Instructions (Signed)
Access Code: [email or web address] URL: [email or web address] Date: 04/09/2020 Prepared by: Dwana Curd  Exercises Supine Figure 4 Piriformis Stretch - 1 x daily - 7 x weekly - 1 sets - 3 reps - 30 sec hold Supine Butterfly Groin Stretch - 1 x daily - 7 x weekly - 1 sets - 3 reps - 30 sec hold Supine Hamstring Stretch with Strap - 1 x daily - 7 x weekly - 1 sets - 3 reps - 30 sec hold Supine ITB Stretch with Strap - 1 x daily - 7 x weekly - 1 sets - 3 reps - 30 sec hold Supine Lower Trunk Rotation - 1 x daily - 7 x weekly - 1 sets - 10 reps - 5 sec hold Child's Pose with Sidebending - 1 x daily - 7 x weekly - 1 sets - 3 reps - 30 sec hold Seated Thoracic Flexion and Rotation with Arms Crossed - 1 x daily - 7 x weekly - 1 sets - 10 reps - 5 sec hold Standing Lumbar Spine Flexion Stretch Counter - 1 x daily - 7 x weekly - 1 sets - 5 reps - 2 sec; rest 6 sec hold

## 2020-04-09 NOTE — Therapy (Signed)
[facility] [address] [place], [place], [address] Phone: [phone]   Fax:  [phone]  Physical Therapy Treatment  Patient Details  Name: Bonnie Lowe MRN: [id number] Date of Birth: Apr 18, 1971 Referring Provider (PT): Romie Levee, MD   Encounter Date: 04/09/2020   PT End of Session - 04/09/20 0922    Visit Number 3    Date for PT Re-Evaluation 06/10/20    PT Start Time 0848    PT Stop Time 0928    PT Time Calculation (min) 40 min    Activity Tolerance Patient tolerated treatment well    Behavior During Therapy [facility] for tasks assessed/performed           Past Medical History:  Diagnosis Date   Angio-edema    Asthma    Hypertension     Past Surgical History:  Procedure Laterality Date   NO PAST SURGERIES      There were no vitals filed for this visit.   Subjective Assessment - 04/09/20 0853    Subjective Pt states she has been doing the stretches and no changes at this time    Patient Stated Goals able to stop having fecal leakage    Currently in Pain? No/denies                             [facility] Adult PT Treatment/Exercise - 04/09/20 0001      Neuro Re-ed    Neuro Re-ed Details  biofeedback used throughout the session      Exercises   Exercises Lumbar      Lumbar Exercises: Standing   Other Standing Lumbar Exercises lean on table and contract 2 sec relax x 8     Other Standing Lumbar Exercises contract relax supine - educated and performed; contract relax in sitting - unable to bulge      Manual Therapy   Manual Therapy Internal Pelvic Floor    Manual therapy comments pt informed and consent given to perform internal TC during contract, relax, and bulge     Internal Pelvic Floor some bulging noticed of posterior vaginal canal                  PT Education - 04/09/20 0954    Education Details Access Code: [email or web address]    Person(s) Educated Patient     Methods Explanation;Demonstration;Tactile cues;Verbal cues;Handout    Comprehension Verbalized understanding;Returned demonstration            PT Short Term Goals - 04/09/20 0943      PT SHORT TERM GOAL #1   Title ind with toileting techniques    Status Achieved      PT SHORT TERM GOAL #2   Title ind with intial HEP    Status Achieved             PT Long Term Goals - 03/18/20 0919      PT LONG TERM GOAL #1   Title pt will be ind with advanced HEP    Time 12    Period Weeks    Status New    Target Date 06/10/20      PT LONG TERM GOAL #2   Title Pt will report improved BM without straining and feeling more complete with emptying    Time 12    Period Weeks    Status New    Target Date 06/10/20      PT  LONG TERM GOAL #3   Title Pt will report knowing when she is going to have a BM at least 9/10 times    Time 12    Period Weeks    Status New    Target Date 06/10/20      PT LONG TERM GOAL #4   Title Pt will report at least 75% less leakage and able to manage due to only small amounts occuring not causing her to need to change clothing    Time 12    Period Weeks    Status New    Target Date 06/10/20                 Plan - 04/09/20 0935    Clinical Impression Statement Pt reports still hasn't noticed a difference.  Today's session focused on pelivc floor  sensation and being able to feel contract and relax.  Pt has muscle tone that elevates at times as well as difficulty relaxing after contractions.  Pt did well with TC and then biofeedback to bring awareness of muscles during the session. Pt was educated on continueing strerthces and added strengthening to HEP    PT Treatment/Interventions ADLs/Self Care Home Management;Biofeedback;Cryotherapy;Electrical Stimulation;Moist Heat;Therapeutic activities;Therapeutic exercise;Neuromuscular re-education;Patient/family education;Manual techniques;Passive range of motion;Dry needling;Taping    PT Next Visit Plan f/u on  new ex's; bulging and toileting ensure correct bulging; pelvic release wand    PT Home Exercise Plan toileting - add hs and piriformis stretch, thoracic flex and rotation; Access Code: [email or web address]    Consulted and Agree with Plan of Care Patient           Patient will benefit from skilled therapeutic intervention in order to improve the following deficits and impairments:  Pain, Impaired sensation, Increased fascial restricitons, Decreased strength, Decreased skin integrity, Decreased range of motion, Decreased coordination, Increased muscle spasms, Impaired flexibility, Impaired tone, Postural dysfunction  Visit Diagnosis: Unspecified lack of coordination  Muscle weakness (generalized)  Cramp and spasm     Problem List Patient Active Problem List   Diagnosis Date Noted   Allergic conjunctivitis 01/12/2020   GERD (gastroesophageal reflux disease) 01/12/2020   Cough, persistent 11/24/2019   Seasonal and perennial allergic rhinitis 08/30/2018   Moderate persistent asthma 08/30/2018   Drug reaction 08/30/2018    Brayton Caves Staci Carver, PT 04/09/2020, 9:59 AM  [facility] [address], [address] [place], [place], [address] Phone: [phone]   Fax:  [phone]  Name: Bonnie Lowe MRN: [id number] Date of Birth: 1971/05/05

## 2020-04-14 ENCOUNTER — Ambulatory Visit (INDEPENDENT_AMBULATORY_CARE_PROVIDER_SITE_OTHER): Payer: BC Managed Care – PPO

## 2020-04-14 DIAGNOSIS — J309 Allergic rhinitis, unspecified: Secondary | ICD-10-CM | POA: Diagnosis not present

## 2020-04-15 ENCOUNTER — Ambulatory Visit: Payer: BC Managed Care – PPO | Attending: General Surgery | Admitting: Physical Therapy

## 2020-04-15 ENCOUNTER — Other Ambulatory Visit: Payer: Self-pay

## 2020-04-15 ENCOUNTER — Encounter: Payer: Self-pay | Admitting: Physical Therapy

## 2020-04-15 DIAGNOSIS — M6281 Muscle weakness (generalized): Secondary | ICD-10-CM | POA: Diagnosis present

## 2020-04-15 DIAGNOSIS — R279 Unspecified lack of coordination: Secondary | ICD-10-CM | POA: Insufficient documentation

## 2020-04-15 DIAGNOSIS — R252 Cramp and spasm: Secondary | ICD-10-CM | POA: Diagnosis present

## 2020-04-15 NOTE — Therapy (Signed)
[facility] [address], [address] [place], [place], [address] Phone: [phone]   Fax:  [phone]  Physical Therapy Treatment  Patient Details  Name: Bonnie Lowe MRN: [id number] Date of Birth: [birth year] Referring Provider (PT): Romie Levee, MD   Encounter Date: 04/15/2020   PT End of Session - 04/15/20 0915    Visit Number 4    Date for PT Re-Evaluation 06/10/20    PT Start Time 0848    PT Stop Time 0928    PT Time Calculation (min) 40 min    Activity Tolerance Patient tolerated treatment well    Behavior During Therapy [facility] for tasks assessed/performed           Past Medical History:  Diagnosis Date  . Angio-edema   . Asthma   . Hypertension     Past Surgical History:  Procedure Laterality Date  . NO PAST SURGERIES      There were no vitals filed for this visit.   Subjective Assessment - 04/15/20 1210    Subjective I don't notice any difference.  I haven't had to use the immodium in the last two weeks that usually happens once every couple of weeks    Pertinent History hx of symtpoms possibly MS related, ovarian cysts removed; hernia surgery, swallowing    Patient Stated Goals able to stop having fecal leakage    Currently in Pain? No/denies                          Pelvic Floor Special Questions - 04/15/20 0001    Pelvic Floor Internal Exam pt identity confirmed and informed conset given to perform    Exam Type Rectal    Strength fair squeeze, definite lift    Tone elevates after doing contraction             [facility] Adult PT Treatment/Exercise - 04/15/20 0001      Neuro Re-ed    Neuro Re-ed Details  cues to breath with relaxing during internal STM to puborectalis; cues to relax anal sphincters when pushing and bulging      Lumbar Exercises: Aerobic   Elliptical L3 x 5 min (6 incline); L1 x 5 min (4 incline) - cues to engage core and pelvic floor 50%      Manual Therapy    Manual therapy comments pt informed and consent given to perform internal TC during contract, relax, and bulge     Internal Pelvic Floor stretching to puborectalis with breathing                    PT Short Term Goals - 04/09/20 0943      PT SHORT TERM GOAL #1   Title ind with toileting techniques    Status Achieved      PT SHORT TERM GOAL #2   Title ind with intial HEP    Status Achieved             PT Long Term Goals - 04/15/20 1205      PT LONG TERM GOAL #1   Title pt will be ind with advanced HEP    Status On-going      PT LONG TERM GOAL #2   Title Pt will report improved BM without straining and feeling more complete with emptying    Baseline does not feel this is hard, but has leakage after the initial BM which feels complete  Status Achieved      PT LONG TERM GOAL #3   Title Pt will report knowing when she is going to have a BM at least 9/10 times    Baseline does not feel leakage is happening    Status On-going      PT LONG TERM GOAL #4   Title Pt will report at least 75% less leakage and able to manage due to only small amounts occuring not causing her to need to change clothing    Baseline no change    Status On-going                 Plan - 04/15/20 0925    Clinical Impression Statement Pt demonstrates good bulge in sitting and reports one normal BM per day but it is the leakage after that she is unable to feel or control.  Pt demonstrated tension of puborectalis wtih difficulty relaxing after contracting and tightening.  Today's session focused on this finding and worked on retraining for improved relaxing with breathing.  Pt also given education and performed on eliptical 50% contraction holds while doing aerobic.  Educated on doing at home with eliptical or walking and to add this to HEP.  Pt continues to benefit from skilled PT for strength and endurance.    PT Treatment/Interventions ADLs/Self Care Home  Management;Biofeedback;Cryotherapy;Electrical Stimulation;Moist Heat;Therapeutic activities;Therapeutic exercise;Neuromuscular re-education;Patient/family education;Manual techniques;Passive range of motion;Dry needling;Taping    PT Next Visit Plan f/u on walking; add standing and general core strength    Consulted and Agree with Plan of Care Patient           Patient will benefit from skilled therapeutic intervention in order to improve the following deficits and impairments:  Pain, Impaired sensation, Increased fascial restricitons, Decreased strength, Decreased skin integrity, Decreased range of motion, Decreased coordination, Increased muscle spasms, Impaired flexibility, Impaired tone, Postural dysfunction  Visit Diagnosis: Unspecified lack of coordination  Muscle weakness (generalized)  Cramp and spasm     Problem List Patient Active Problem List   Diagnosis Date Noted  . Allergic conjunctivitis 01/12/2020  . GERD (gastroesophageal reflux disease) 01/12/2020  . Cough, persistent 11/24/2019  . Seasonal and perennial allergic rhinitis 08/30/2018  . Moderate persistent asthma 08/30/2018  . Drug reaction 08/30/2018    Junious Silk, PT 04/15/2020, 12:12 PM   [facility] [address] [place], [place], [address] Phone: [phone]   Fax:  [phone]  Name: Kilah Drahos MRN: [id number] Date of Birth: 04-09-1971

## 2020-04-21 ENCOUNTER — Ambulatory Visit (INDEPENDENT_AMBULATORY_CARE_PROVIDER_SITE_OTHER): Payer: BC Managed Care – PPO

## 2020-04-21 DIAGNOSIS — J309 Allergic rhinitis, unspecified: Secondary | ICD-10-CM | POA: Diagnosis not present

## 2020-04-22 ENCOUNTER — Other Ambulatory Visit: Payer: Self-pay

## 2020-04-22 ENCOUNTER — Ambulatory Visit: Payer: BC Managed Care – PPO | Admitting: Physical Therapy

## 2020-04-22 DIAGNOSIS — R252 Cramp and spasm: Secondary | ICD-10-CM

## 2020-04-22 DIAGNOSIS — M6281 Muscle weakness (generalized): Secondary | ICD-10-CM

## 2020-04-22 DIAGNOSIS — R279 Unspecified lack of coordination: Secondary | ICD-10-CM

## 2020-04-22 NOTE — Patient Instructions (Signed)
Access Code: [email or web address] URL: [email or web address] Date: 04/22/2020 Prepared by: Dwana Curd  Exercises Supine Figure 4 Piriformis Stretch - 1 x daily - 7 x weekly - 1 sets - 3 reps - 30 sec hold Supine Butterfly Groin Stretch - 1 x daily - 7 x weekly - 1 sets - 3 reps - 30 sec hold Supine Hamstring Stretch with Strap - 1 x daily - 7 x weekly - 1 sets - 3 reps - 30 sec hold Supine ITB Stretch with Strap - 1 x daily - 7 x weekly - 1 sets - 3 reps - 30 sec hold Supine Lower Trunk Rotation - 1 x daily - 7 x weekly - 1 sets - 10 reps - 5 sec hold Child's Pose with Sidebending - 1 x daily - 7 x weekly - 1 sets - 3 reps - 30 sec hold Seated Thoracic Flexion and Rotation with Arms Crossed - 1 x daily - 7 x weekly - 1 sets - 10 reps - 5 sec hold Standing Lumbar Spine Flexion Stretch Counter - 1 x daily - 7 x weekly - 1 sets - 5 reps - 2 sec; rest 6 sec hold Supine Pelvic Floor Stretch - Hands on Knees - 1 x daily - 7 x weekly - 3 sets - 10 reps

## 2020-04-22 NOTE — Therapy (Signed)
[facility] [address] [place], [place], [address] Phone: [phone]   Fax:  [phone]  Physical Therapy Treatment  Patient Details  Name: Bonnie Lowe MRN: [id number] Date of Birth: [birth year] Referring Provider (PT): Romie Levee, MD   Encounter Date: 04/22/2020   PT End of Session - 04/22/20 1215    Visit Number 5    Date for PT Re-Evaluation 06/10/20    PT Start Time 0933    PT Stop Time 1013    PT Time Calculation (min) 40 min    Activity Tolerance Patient tolerated treatment well    Behavior During Therapy [facility] for tasks assessed/performed           Past Medical History:  Diagnosis Date   Angio-edema    Asthma    Hypertension     Past Surgical History:  Procedure Laterality Date   NO PAST SURGERIES      There were no vitals filed for this visit.   Subjective Assessment - 04/22/20 1220    Subjective I had urinary leakage when sneezing last night and it was bad and had to change my clothes    Patient Stated Goals able to stop having fecal leakage    Currently in Pain? No/denies                             [facility] Adult PT Treatment/Exercise - 04/22/20 0001      Neuro Re-ed    Neuro Re-ed Details  biofeedback and internal /external TC to relax pelvic floor; TC and educated on performing knack      Lumbar Exercises: Stretches   Single Knee to Chest Stretch Right;Left;3 reps;20 seconds    Figure 4 Stretch 3 reps;20 seconds;Supine;With overpressure      Lumbar Exercises: Supine   Other Supine Lumbar Exercises hooklying kegel with biofeedback SUI training program - gradual increase in contraction - ony holding for 2 sec and unable to do more than 10 mV contraction      Manual Therapy   Internal Pelvic Floor pt informed and consent given to perform internal TC during contract, relax, and bulge                   PT Education - 04/22/20 1013    Education  Details [email or web address]    Person(s) Educated Patient    Methods Explanation;Demonstration;Tactile cues;Verbal cues;Handout    Comprehension Verbalized understanding;Returned demonstration            PT Short Term Goals - 04/09/20 0943      PT SHORT TERM GOAL #1   Title ind with toileting techniques    Status Achieved      PT SHORT TERM GOAL #2   Title ind with intial HEP    Status Achieved             PT Long Term Goals - 04/15/20 1205      PT LONG TERM GOAL #1   Title pt will be ind with advanced HEP    Status On-going      PT LONG TERM GOAL #2   Title Pt will report improved BM without straining and feeling more complete with emptying    Baseline does not feel this is hard, but has leakage after the initial BM which feels complete    Status Achieved      PT LONG TERM GOAL #3   Title Pt will  report knowing when she is going to have a BM at least 9/10 times    Baseline does not feel leakage is happening    Status On-going      PT LONG TERM GOAL #4   Title Pt will report at least 75% less leakage and able to manage due to only small amounts occuring not causing her to need to change clothing    Baseline no change    Status On-going                 Plan - 04/22/20 1221    Clinical Impression Statement Pt did well with biofeedback and was able to identify how to relax the pelvic floor using stretches.  Once starting at a lower resting tone she maintained during exercises.  Pt continue to have lower endurance but will be able to focus more on endurance after she is able to relax more easily.  She was able to perform knack after education and after cued to initially relax pelvic floor muscles.  Pt will benefit from skilled PT to work on muscle strength and coordination for improved bowel and bladder management.    PT Treatment/Interventions ADLs/Self Care Home Management;Biofeedback;Cryotherapy;Electrical Stimulation;Moist Heat;Therapeutic activities;Therapeutic  exercise;Neuromuscular re-education;Patient/family education;Manual techniques;Passive range of motion;Dry needling;Taping    PT Next Visit Plan f/u on adding stretches and continued walking    PT Home Exercise Plan toileting - add hs and piriformis stretch, thoracic flex and rotation; Access Code: [email or web address]    Consulted and Agree with Plan of Care Patient           Patient will benefit from skilled therapeutic intervention in order to improve the following deficits and impairments:  Pain, Impaired sensation, Increased fascial restricitons, Decreased strength, Decreased skin integrity, Decreased range of motion, Decreased coordination, Increased muscle spasms, Impaired flexibility, Impaired tone, Postural dysfunction  Visit Diagnosis: Unspecified lack of coordination  Muscle weakness (generalized)  Cramp and spasm     Problem List Patient Active Problem List   Diagnosis Date Noted   Allergic conjunctivitis 01/12/2020   GERD (gastroesophageal reflux disease) 01/12/2020   Cough, persistent 11/24/2019   Seasonal and perennial allergic rhinitis 08/30/2018   Moderate persistent asthma 08/30/2018   Drug reaction 08/30/2018    Brayton Caves Tahirah Sara, PT 04/22/2020, 12:36 PM  [facility] [address], [address] [place], [place], [address] Phone: [phone]   Fax:  [phone]  Name: Bonnie Lowe MRN: [id number] Date of Birth: 01/19/1971

## 2020-04-27 ENCOUNTER — Encounter: Payer: BC Managed Care – PPO | Admitting: Physical Therapy

## 2020-05-03 ENCOUNTER — Ambulatory Visit (INDEPENDENT_AMBULATORY_CARE_PROVIDER_SITE_OTHER): Payer: BC Managed Care – PPO

## 2020-05-03 DIAGNOSIS — J309 Allergic rhinitis, unspecified: Secondary | ICD-10-CM

## 2020-05-13 ENCOUNTER — Ambulatory Visit: Payer: BC Managed Care – PPO | Admitting: Allergy and Immunology

## 2020-05-24 ENCOUNTER — Encounter: Payer: Self-pay | Admitting: Physical Therapy

## 2020-05-24 ENCOUNTER — Other Ambulatory Visit: Payer: Self-pay

## 2020-05-24 ENCOUNTER — Ambulatory Visit: Payer: BC Managed Care – PPO | Attending: General Surgery | Admitting: Physical Therapy

## 2020-05-24 DIAGNOSIS — M6281 Muscle weakness (generalized): Secondary | ICD-10-CM | POA: Insufficient documentation

## 2020-05-24 DIAGNOSIS — R279 Unspecified lack of coordination: Secondary | ICD-10-CM | POA: Diagnosis present

## 2020-05-24 DIAGNOSIS — R252 Cramp and spasm: Secondary | ICD-10-CM

## 2020-05-24 NOTE — Therapy (Signed)
[facility] [address] [place], [place], [address] Phone: [phone]   Fax:  [phone]  Physical Therapy Treatment  Patient Details  Name: Bonnie Lowe MRN: [id number] Date of Birth: 1970-09-25 Referring Provider (PT): Leighton Ruff, MD   Encounter Date: 05/24/2020   PT End of Session - 05/24/20 1053    Visit Number 6    Date for PT Re-Evaluation 07/19/20    PT Start Time 4287    PT Stop Time 1054    PT Time Calculation (min) 39 min    Activity Tolerance Patient tolerated treatment well    Behavior During Therapy [facility] for tasks assessed/performed           Past Medical History:  Diagnosis Date  . Angio-edema   . Asthma   . Hypertension     Past Surgical History:  Procedure Laterality Date  . NO PAST SURGERIES      There were no vitals filed for this visit.   Subjective Assessment - 05/24/20 1017    Subjective The last two weeks I have been working a lot and I need to get better with exercises in general.  I am out of town first 2 weeks of January. I want to have energy and see if the core strengthening helps reduce leakage    Pertinent History hx of symtpoms possibly MS related, ovarian cysts removed; hernia surgery, swallowing    Patient Stated Goals able to stop having fecal leakage    Currently in Pain? No/denies                             [facility] Adult PT Treatment/Exercise - 05/24/20 0001      Self-Care   Other Self-Care Comments  water intake - education and recommend to increase fo rimproved muscle function      Lumbar Exercises: Supine   Bridge 20 reps;2 seconds    Bridge Limitations with kegel      Manual Therapy   Myofascial Release iliocecal valve counterclockwise; fascial release to cecum, ascending and descending colon, stomach                    PT Short Term Goals - 04/09/20 0943      PT SHORT TERM GOAL #1   Title ind with toileting  techniques    Status Achieved      PT SHORT TERM GOAL #2   Title ind with intial HEP    Status Achieved             PT Long Term Goals - 05/24/20 1021      PT LONG TERM GOAL #1   Title pt will be ind with advanced HEP    Baseline still working on adding strengthening; appointments have been spaced out    Time 8    Period Weeks    Status On-going    Target Date 07/19/20      PT LONG TERM GOAL #2   Title Pt will report improved BM without straining and feeling more complete with emptying    Baseline does not feel this is hard, but has leakage after the initial BM which feels complete    Status Achieved      PT LONG TERM GOAL #3   Title Pt will report knowing when she is going to have a BM at least 9/10 times    Baseline I can tell when I  am going to have a regular BM    Status Achieved      PT LONG TERM GOAL #4   Title Pt will report at least 75% less leakage and able to manage due to only small amounts occuring not causing her to need to change clothing    Baseline it varies, but has only been small amounts the last two weeks, still not confident    Status Partially Met    Target Date 07/19/20      PT LONG TERM GOAL #5   Title Pt will be able to have complete BM and no residual anal leakage of any amount at least 80% of the time    Baseline after BM pt will have small amounts of leakage that she is unaware of    Time 8    Period Weeks    Status New    Target Date 07/19/20                 Plan - 05/24/20 1915    Clinical Impression Statement Pt has made overall progress in 6 sessions and having ability to sense when she has to have a BM and only having small amount of leakage in the last two weeks.  Pt is expected to make progress as she works towards improved water intake as discussed today and is able to progress her strengthening of core and pelvic floor with more regular and consistant exercises.  PT added long term goals and update remaining goals as seen  above.  She was able to add strength progression to HEP today.  Pt had good release with fascial work done to cecum and both ascending and descending colon and stomach.  Pt will benefit from skilled PT to address impairments and achieve functional goals    Comorbidities hernia surgery, ovarian cyst surgery, chronic issue, other nerve related issues and muscle spasms in LE and glottis    Examination-Participation Restrictions Community Activity;Interpersonal Relationship;Occupation    Rehab Potential Excellent    PT Frequency 1x / week    PT Duration 8 weeks    PT Treatment/Interventions ADLs/Self Care Home Management;Biofeedback;Cryotherapy;Electrical Stimulation;Moist Heat;Therapeutic activities;Therapeutic exercise;Neuromuscular re-education;Patient/family education;Manual techniques;Passive range of motion;Dry needling;Taping    PT Next Visit Plan f/u on water, walking, progress core and pelvic floor strength, assess posture    PT Home Exercise Plan toileting - add hs and piriformis stretch, thoracic flex and rotation; Access Code: [email or web address]    Consulted and Agree with Plan of Care Patient           Patient will benefit from skilled therapeutic intervention in order to improve the following deficits and impairments:  Pain,Impaired sensation,Increased fascial restricitons,Decreased strength,Decreased skin integrity,Decreased range of motion,Decreased coordination,Increased muscle spasms,Impaired flexibility,Impaired tone,Postural dysfunction  Visit Diagnosis: Unspecified lack of coordination  Muscle weakness (generalized)  Cramp and spasm     Problem List Patient Active Problem List   Diagnosis Date Noted  . Allergic conjunctivitis 01/12/2020  . GERD (gastroesophageal reflux disease) 01/12/2020  . Cough, persistent 11/24/2019  . Seasonal and perennial allergic rhinitis 08/30/2018  . Moderate persistent asthma 08/30/2018  . Drug reaction 08/30/2018    Jule Ser,  PT 05/24/2020, 7:40 PM  [facility] [address], [place], [place], [address] Phone: [phone]   Fax:  [phone]  Name: Bonnie Lowe MRN: [id number] Date of Birth: 22-Aug-1970

## 2020-05-25 ENCOUNTER — Encounter: Payer: Self-pay | Admitting: Allergy and Immunology

## 2020-05-25 ENCOUNTER — Other Ambulatory Visit: Payer: Self-pay | Admitting: Gastroenterology

## 2020-05-25 ENCOUNTER — Ambulatory Visit
Admission: RE | Admit: 2020-05-25 | Discharge: 2020-05-25 | Disposition: A | Discharge status: Home / Self Care | Payer: BC Managed Care – PPO | Source: Ambulatory Visit | Attending: Gastroenterology | Admitting: Gastroenterology

## 2020-05-25 ENCOUNTER — Ambulatory Visit (INDEPENDENT_AMBULATORY_CARE_PROVIDER_SITE_OTHER): Payer: BC Managed Care – PPO | Admitting: Allergy and Immunology

## 2020-05-25 DIAGNOSIS — J3089 Other allergic rhinitis: Secondary | ICD-10-CM

## 2020-05-25 DIAGNOSIS — J4540 Moderate persistent asthma, uncomplicated: Secondary | ICD-10-CM

## 2020-05-25 DIAGNOSIS — J302 Other seasonal allergic rhinitis: Secondary | ICD-10-CM | POA: Diagnosis not present

## 2020-05-25 DIAGNOSIS — K5900 Constipation, unspecified: Secondary | ICD-10-CM

## 2020-05-25 NOTE — Assessment & Plan Note (Signed)
Well-controlled and stable.  For now, continue Symbicort 160-4.5 g, 2 inhalations via spacer device twice daily.  Continue montelukast 10 mg daily at bedtime.  Continue albuterol HFA, 1 to 2 inhalations every 4-6 hours if needed.  If subjective and objective measures of pulmonary function remain stable, we will consider stepping down therapy on the next visit.

## 2020-05-25 NOTE — Assessment & Plan Note (Addendum)
   Continue appropriate allergen avoidance measures.  Continue immunotherapy injections per protocol.  Azelastine/fluticasone nasal spray, with 1 spray per nostril twice daily as needed.  Ipratropium nasal spray, 1 to 2 sprays per nostril 2-3 times daily as needed.  Nasal saline lavage (NeilMed) has been recommended as needed and prior to medicated nasal sprays along with instructions for proper administration.  For thick post nasal drainage, add guaifenesin 1200 mg (Mucinex Maximum Strength)  twice daily as needed with adequate hydration as discussed.  Continue Pataday or Zaditor eyedrops as needed plus/minus Natural Tears as needed.

## 2020-05-25 NOTE — Progress Notes (Signed)
Follow-up Note  RE: Bonnie Lowe MRN: [id number] DOB: 1971/05/26 Date of Office Visit: 05/25/2020  Primary care provider: Hezzie Bump, FNP Referring provider: Hezzie Bump, F*  History of present illness: Bonnie Lowe is a 49 y.o. female with persistent asthma and allergic rhinoconjunctivitis presenting today for follow-up.  She was last seen in this clinic on January 12, 2020.  She reports that in the interval since her previous visit her asthma has been well controlled.  She has not required albuterol over the past month and denies limitations in normal daily activities and nocturnal awakenings due to lower respiratory symptoms. The patient reports that while taking azelastine/fluticasone nasal spray in addition to ipratropium nasal spray, her nasal allergy symptoms have been well controlled.  She has been receiving the aeroallergen immunotherapy buildup injections per protocol without problems or complications.  She has no new problems or complaints today.  Assessment and plan: Moderate persistent asthma Well-controlled and stable.  For now, continue Symbicort 160-4.5 g, 2 inhalations via spacer device twice daily.  Continue montelukast 10 mg daily at bedtime.  Continue albuterol HFA, 1 to 2 inhalations every 4-6 hours if needed.  If subjective and objective measures of pulmonary function remain stable, we will consider stepping down therapy on the next visit.  Seasonal and perennial allergic rhinoconjunctivitis  Continue appropriate allergen avoidance measures.  Continue immunotherapy injections per protocol.  Azelastine/fluticasone nasal spray, with 1 spray per nostril twice daily as needed.  Ipratropium nasal spray, 1 to 2 sprays per nostril 2-3 times daily as needed.  Nasal saline lavage (NeilMed) has been recommended as needed and prior to medicated nasal sprays along with instructions for proper administration.  For thick post nasal drainage, add  guaifenesin 1200 mg (Mucinex Maximum Strength)  twice daily as needed with adequate hydration as discussed.  Continue Pataday or Zaditor eyedrops as needed plus/minus Natural Tears as needed.  Diagnostics: Due to Covid precautions, spirometry was not performed today as the patient's symptoms are reported as well controlled and pulmonary exam was normal.    Physical examination: Blood pressure 122/70, pulse 88, temperature (!) 97.2 F (36.2 C), temperature source Temporal, resp. rate 16, SpO2 98 %.  General: Alert, interactive, in no acute distress. HEENT: TMs pearly gray, turbinates mildly edematous without discharge, post-pharynx unremarkable. Neck: Supple without lymphadenopathy. Lungs: Clear to auscultation without wheezing, rhonchi or rales. CV: Normal S1, S2 without murmurs. Skin: Warm and dry, without lesions or rashes.  The following portions of the patient's history were reviewed and updated as appropriate: allergies, current medications, past family history, past medical history, past social history, past surgical history and problem list.  Current Outpatient Medications  Medication Sig Dispense Refill  . albuterol (VENTOLIN HFA) 108 (90 Base) MCG/ACT inhaler Inhale 2 puffs into the lungs every 4 (four) hours as needed. 18 g 1  . Azelastine-Fluticasone 137-50 MCG/ACT SUSP USE 1 SPRAY INTO EACH NOSTRIL TWICE A DAY 23 g 2  . cetirizine (ZYRTEC) 10 MG tablet Take 10 mg by mouth 2 (two) times daily.     . Cholecalciferol 25 MCG (1000 UT) tablet Take 1,000 Units by mouth daily.    Marland Kitchen EPINEPHrine (AUVI-Q) 0.3 mg/0.3 mL IJ SOAJ injection Inject 0.3 mLs (0.3 mg total) into the muscle as needed for anaphylaxis. 2 each 1  . ibuprofen (ADVIL,MOTRIN) 800 MG tablet Take 800 mg by mouth 3 (three) times daily as needed.    Marland Kitchen ipratropium (ATROVENT) 0.03 % nasal spray 1-2 sprays each nostril 2-3  times daily as needed 30 mL 5  . levothyroxine (SYNTHROID) 75 MCG tablet Take 75 mcg by mouth daily  before breakfast.    . liothyronine (CYTOMEL) 5 MCG tablet Take 5 mcg by mouth daily.    Marland Kitchen losartan (COZAAR) 50 MG tablet Take 50 mg by mouth daily.    . montelukast (SINGULAIR) 10 MG tablet Take 10 mg by mouth at bedtime.    . NON FORMULARY 2 injections week    . phentermine (ADIPEX-P) 37.5 MG tablet Take 37.5 mg by mouth as needed.    Marland Kitchen spironolactone (ALDACTONE) 25 MG tablet Take 25 mg by mouth daily.    . valACYclovir (VALTREX) 1000 MG tablet 1,000 mg as needed.    . zolpidem (AMBIEN) 10 MG tablet Take 10 mg by mouth at bedtime as needed.    . budesonide-formoterol (SYMBICORT) 160-4.5 MCG/ACT inhaler Inhale 2 puffs into the lungs 2 (two) times daily. (Patient not taking: Reported on 05/25/2020)    . Olopatadine HCl (PATADAY) 0.2 % SOLN Place 1 drop into both eyes daily as needed. (Patient not taking: Reported on 05/25/2020) 2.5 mL 5  . Omeprazole 20 MG TBDD 1 tablet daily 30 minutes before breakfast for acid reflux (Patient not taking: Reported on 05/25/2020) 30 tablet 5  . pimecrolimus (ELIDEL) 1 % cream Apply 1 application topically 2 (two) times daily. (Patient not taking: Reported on 05/25/2020)     Current Facility-Administered Medications  Medication Dose Route Frequency Provider Last Rate Last Admin  . predniSONE (DELTASONE) tablet 10 mg  10 mg Oral Q breakfast Janelle Culton, Heywood Iles, MD        Allergies  Allergen Reactions  . Moxifloxacin Hcl Itching  . Citrus Diarrhea  . Erythromycin   . Sulfa Antibiotics     I appreciate the opportunity to take part in [name]'s care. Please do not hesitate to contact me with questions.  Sincerely,   R. Jorene Guest, MD

## 2020-05-25 NOTE — Patient Instructions (Addendum)
Moderate persistent asthma Well-controlled and stable.  For now, continue Symbicort 160-4.5 g, 2 inhalations via spacer device twice daily.  Continue montelukast 10 mg daily at bedtime.  Continue albuterol HFA, 1 to 2 inhalations every 4-6 hours if needed.  If subjective and objective measures of pulmonary function remain stable, we will consider stepping down therapy on the next visit.  Seasonal and perennial allergic rhinoconjunctivitis  Continue appropriate allergen avoidance measures.  Continue immunotherapy injections per protocol.  Azelastine/fluticasone nasal spray, with 1 spray per nostril twice daily as needed.  Ipratropium nasal spray, 1 to 2 sprays per nostril 2-3 times daily as needed.  Nasal saline lavage (NeilMed) has been recommended as needed and prior to medicated nasal sprays along with instructions for proper administration.  For thick post nasal drainage, add guaifenesin 1200 mg (Mucinex Maximum Strength)  twice daily as needed with adequate hydration as discussed.  Continue Pataday or Zaditor eyedrops as needed plus/minus Natural Tears as needed.   Return in about 5 months (around 10/23/2020), or if symptoms worsen or fail to improve.

## 2020-05-26 ENCOUNTER — Ambulatory Visit (INDEPENDENT_AMBULATORY_CARE_PROVIDER_SITE_OTHER): Payer: BC Managed Care – PPO

## 2020-05-26 DIAGNOSIS — J309 Allergic rhinitis, unspecified: Secondary | ICD-10-CM | POA: Diagnosis not present

## 2020-05-31 ENCOUNTER — Encounter: Payer: BC Managed Care – PPO | Admitting: Physical Therapy

## 2020-06-02 ENCOUNTER — Ambulatory Visit (INDEPENDENT_AMBULATORY_CARE_PROVIDER_SITE_OTHER): Payer: BC Managed Care – PPO

## 2020-06-02 DIAGNOSIS — J309 Allergic rhinitis, unspecified: Secondary | ICD-10-CM

## 2020-06-08 ENCOUNTER — Encounter: Payer: BC Managed Care – PPO | Admitting: Physical Therapy

## 2020-06-20 ENCOUNTER — Other Ambulatory Visit: Payer: Self-pay | Admitting: Allergy and Immunology

## 2020-06-29 ENCOUNTER — Ambulatory Visit (INDEPENDENT_AMBULATORY_CARE_PROVIDER_SITE_OTHER): Payer: BC Managed Care – PPO

## 2020-06-29 DIAGNOSIS — J309 Allergic rhinitis, unspecified: Secondary | ICD-10-CM

## 2020-07-01 ENCOUNTER — Encounter: Payer: BC Managed Care – PPO | Admitting: Physical Therapy

## 2020-07-06 ENCOUNTER — Encounter: Payer: Self-pay | Admitting: Physical Therapy

## 2020-07-06 ENCOUNTER — Other Ambulatory Visit: Payer: Self-pay

## 2020-07-06 ENCOUNTER — Ambulatory Visit: Payer: BC Managed Care – PPO | Attending: General Surgery | Admitting: Physical Therapy

## 2020-07-06 ENCOUNTER — Ambulatory Visit (INDEPENDENT_AMBULATORY_CARE_PROVIDER_SITE_OTHER): Payer: BC Managed Care – PPO

## 2020-07-06 DIAGNOSIS — J309 Allergic rhinitis, unspecified: Secondary | ICD-10-CM

## 2020-07-06 DIAGNOSIS — R279 Unspecified lack of coordination: Secondary | ICD-10-CM

## 2020-07-06 DIAGNOSIS — M6281 Muscle weakness (generalized): Secondary | ICD-10-CM | POA: Insufficient documentation

## 2020-07-06 DIAGNOSIS — R252 Cramp and spasm: Secondary | ICD-10-CM | POA: Insufficient documentation

## 2020-07-07 NOTE — Therapy (Signed)
[facility] [address] [place], [place], [address] Phone: [phone]   Fax:  [phone]  Physical Therapy Treatment  Patient Details  Name: Bonnie Lowe MRN: [id number] Date of Birth: 11/07/1970 Referring Provider (PT): Romie Levee, MD   Encounter Date: 07/06/2020   PT End of Session - 07/07/20 1345    Visit Number 7    Date for PT Re-Evaluation 09/28/20    PT Start Time 1500    PT Stop Time 1531    PT Time Calculation (min) 31 min    Activity Tolerance Patient tolerated treatment well    Behavior During Therapy [facility] for tasks assessed/performed           Past Medical History:  Diagnosis Date  . Angio-edema   . Asthma   . Hypertension     Past Surgical History:  Procedure Laterality Date  . NO PAST SURGERIES      There were no vitals filed for this visit.   Subjective Assessment - 07/06/20 1456    Subjective Pt states she has been doing the exercises and noticed some improvement and states she hasn't had any horrible days.    Pertinent History hx of symtpoms possibly MS related, ovarian cysts removed; hernia surgery, swallowing    Patient Stated Goals able to stop having fecal leakage    Currently in Pain? No/denies                             [facility] Adult PT Treatment/Exercise - 07/07/20 0001      Lumbar Exercises: Standing   Functional Squats 10 reps    Functional Squats Limitations slow lowering    Other Standing Lumbar Exercises side stepping with light green loop around thighs      Lumbar Exercises: Supine   Bridge 20 reps;2 seconds    Bridge Limitations with kegel      Lumbar Exercises: Quadruped   Single Arm Raises Limitations core brace and kegel with UE left - 10x                    PT Short Term Goals - 04/09/20 0943      PT SHORT TERM GOAL #1   Title ind with toileting techniques    Status Achieved      PT SHORT TERM GOAL #2   Title ind  with intial HEP    Status Achieved             PT Long Term Goals - 07/06/20 1502      PT LONG TERM GOAL #1   Title pt will be ind with advanced HEP    Baseline progressed today    Time 12    Period Weeks    Status On-going    Target Date 09/28/20      PT LONG TERM GOAL #2   Title Pt will report improved BM without straining and feeling more complete with emptying    Status Achieved      PT LONG TERM GOAL #3   Title Pt will report not having leakage or fecal smearing more than 1x/day    Baseline I have leakage throughout the day that I don't know about and I notice it 3-4x every time I have to pee    Time 12    Period Weeks    Status Revised      PT LONG TERM GOAL #4  Title Pt will report at least 75% less leakage and able to manage due to only small amounts occuring not causing her to need to change clothing    Baseline not having to change clothing, stops it with toilet paper    Status Achieved      PT LONG TERM GOAL #5   Title Pt will be able to have complete BM and no residual anal leakage of any amount at least 6/7 days per week    Baseline after BM pt will have small amounts of leakage that she is unaware of happening almost every day    Time 12    Period Weeks    Status On-going    Target Date 09/28/20                 Plan - 07/07/20 1430    Clinical Impression Statement Pt goals were re-assessed today as she had a laps in her treatment due to traveling and weather issues.  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 She has made a little progress since previous visit reporting that her issues are definitely less that they were but she still has a lot of fecal smearing throughout the day.  Pt was able to progress exercises today as seen in chart and added to HEP.  Pt will benefit from skilled PT to continue to work towards functional goals and new goal that was added at last  visit.    Comorbidities hernia surgery, ovarian cyst surgery, chronic issue, other nerve related issues and muscle spasms in LE and glottis    Examination-Activity Limitations Continence;Toileting    Examination-Participation Restrictions Community Activity;Interpersonal Relationship;Occupation    PT Frequency 2x / week    PT Duration 12 weeks   unable to come until March - rec is actually for 2x/week for 4-6 wks   PT Treatment/Interventions ADLs/Self Care Home Management;Biofeedback;Cryotherapy;Electrical Stimulation;Moist Heat;Therapeutic activities;Therapeutic exercise;Neuromuscular re-education;Patient/family education;Manual techniques;Passive range of motion;Dry needling;Taping    PT Next Visit Plan still walking and drinking more water?; core and pelvic floor strength progression, knack and posture strength, assess pelvic floor strength and endurance    PT Home Exercise Plan toileting - add hs and piriformis stretch, thoracic flex and rotation; Access Code: [email or web address]    Consulted and Agree with Plan of Care Patient           Patient will benefit from skilled therapeutic intervention in order to improve the following deficits and impairments:  Pain,Impaired sensation,Increased fascial restricitons,Decreased strength,Decreased skin integrity,Decreased range of motion,Decreased coordination,Increased muscle spasms,Impaired flexibility,Impaired tone,Postural dysfunction  Visit Diagnosis: Unspecified lack of coordination  Muscle weakness (generalized)  Cramp and spasm     Problem List Patient Active Problem List   Diagnosis Date Noted  . Allergic conjunctivitis 01/12/2020  . GERD (gastroesophageal reflux disease) 01/12/2020  . Cough, persistent 11/24/2019  . Seasonal and perennial allergic rhinoconjunctivitis 08/30/2018  . Moderate persistent asthma 08/30/2018  . Drug reaction 08/30/2018    Junious Silk, PT 07/07/2020, 4:48 PM  [facility] [address], [address] [place], [place], [address] Phone: [phone]   Fax:  [phone]  Name: Lisa-Marie Rueger MRN: [id number] Date of Birth: 1970-09-16

## 2020-07-07 NOTE — Addendum Note (Signed)
Addended by: Beatris Si on: 07/07/2020 04:50 PM   Modules accepted: Orders

## 2020-07-22 ENCOUNTER — Ambulatory Visit (INDEPENDENT_AMBULATORY_CARE_PROVIDER_SITE_OTHER): Payer: BC Managed Care – PPO

## 2020-07-22 DIAGNOSIS — J309 Allergic rhinitis, unspecified: Secondary | ICD-10-CM | POA: Diagnosis not present

## 2020-08-03 ENCOUNTER — Ambulatory Visit (INDEPENDENT_AMBULATORY_CARE_PROVIDER_SITE_OTHER): Payer: BC Managed Care – PPO

## 2020-08-03 DIAGNOSIS — J309 Allergic rhinitis, unspecified: Secondary | ICD-10-CM

## 2020-08-13 ENCOUNTER — Encounter: Payer: BC Managed Care – PPO | Admitting: Physical Therapy

## 2020-08-13 ENCOUNTER — Ambulatory Visit (INDEPENDENT_AMBULATORY_CARE_PROVIDER_SITE_OTHER): Payer: BC Managed Care – PPO

## 2020-08-13 DIAGNOSIS — J309 Allergic rhinitis, unspecified: Secondary | ICD-10-CM

## 2020-08-17 ENCOUNTER — Encounter: Payer: BC Managed Care – PPO | Admitting: Physical Therapy

## 2020-08-20 ENCOUNTER — Encounter: Payer: BC Managed Care – PPO | Admitting: Physical Therapy

## 2020-08-23 ENCOUNTER — Ambulatory Visit (INDEPENDENT_AMBULATORY_CARE_PROVIDER_SITE_OTHER): Payer: BC Managed Care – PPO

## 2020-08-23 DIAGNOSIS — J309 Allergic rhinitis, unspecified: Secondary | ICD-10-CM | POA: Diagnosis not present

## 2020-08-24 ENCOUNTER — Encounter: Payer: BC Managed Care – PPO | Admitting: Physical Therapy

## 2020-08-27 ENCOUNTER — Encounter: Payer: BC Managed Care – PPO | Admitting: Physical Therapy

## 2020-08-31 ENCOUNTER — Encounter: Payer: BC Managed Care – PPO | Admitting: Physical Therapy

## 2020-09-03 ENCOUNTER — Encounter: Payer: BC Managed Care – PPO | Admitting: Physical Therapy

## 2020-09-03 ENCOUNTER — Ambulatory Visit (INDEPENDENT_AMBULATORY_CARE_PROVIDER_SITE_OTHER): Payer: BC Managed Care – PPO

## 2020-09-03 DIAGNOSIS — J309 Allergic rhinitis, unspecified: Secondary | ICD-10-CM | POA: Diagnosis not present

## 2020-09-17 ENCOUNTER — Ambulatory Visit (INDEPENDENT_AMBULATORY_CARE_PROVIDER_SITE_OTHER): Payer: BC Managed Care – PPO

## 2020-09-17 DIAGNOSIS — J309 Allergic rhinitis, unspecified: Secondary | ICD-10-CM

## 2020-09-20 ENCOUNTER — Other Ambulatory Visit: Payer: Self-pay

## 2020-09-20 ENCOUNTER — Ambulatory Visit: Payer: BC Managed Care – PPO | Attending: General Surgery | Admitting: Physical Therapy

## 2020-09-20 DIAGNOSIS — M6281 Muscle weakness (generalized): Secondary | ICD-10-CM | POA: Diagnosis present

## 2020-09-20 DIAGNOSIS — R252 Cramp and spasm: Secondary | ICD-10-CM | POA: Insufficient documentation

## 2020-09-20 DIAGNOSIS — R279 Unspecified lack of coordination: Secondary | ICD-10-CM | POA: Diagnosis present

## 2020-09-20 NOTE — Therapy (Signed)
[facility] [address] [place], [place], [address] Phone: [phone]   Fax:  [phone]  Physical Therapy Treatment  Patient Details  Name: Bonnie Lowe MRN: [id number] Date of Birth: 06/16/1970 Referring Provider (PT): Romie Levee, MD   Encounter Date: 09/20/2020   PT End of Session - 09/20/20 1748    Visit Number 8    Date for PT Re-Evaluation 12/13/20    Authorization Type bcbs    PT Start Time 1617    PT Stop Time 1700    PT Time Calculation (min) 43 min    Activity Tolerance Patient tolerated treatment well    Behavior During Therapy [facility] for tasks assessed/performed           Past Medical History:  Diagnosis Date  . Angio-edema   . Asthma   . Hypertension     Past Surgical History:  Procedure Laterality Date  . NO PAST SURGERIES      There were no vitals filed for this visit.   Subjective Assessment - 09/20/20 1623    Subjective Pt was unable to come to PT due to working a lot.  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   Pt states leakage is the same and depends on the conistency of the feces.  Some days, maybe 2 days/week it is loose and has leakage.  Even when it's firm it is sill coming out every so often and keep wiping and have to keep toilet paper there to make sure of no smearing on underwear.  Urinary leakage is once in blue moon.    Pertinent History hx of symtpoms possibly MS related, ovarian cysts removed; hernia surgery, swallowing    Diagnostic tests MRI recently for ruling out MS, endoscopy, colonoscopy    Patient Stated Goals able to stop having fecal leakage, not have surgery    Currently in Pain? No/denies              [facility] PT Assessment - 09/20/20 0001      Assessment   Medical Diagnosis K62.9,R15.9 (ICD-10-CM) - Fecal incontinence due to anorectal disorder    Referring Provider (PT) Romie Levee, MD                          [facility] Adult PT Treatment/Exercise - 09/20/20 0001      Self-Care   Other Self-Care Comments  educated on istim kegel unit with anal probe for strengthening      Exercises   Other Exercises  reviewed HEP and highlighted basic exercises and stretches      Lumbar Exercises: Standing   Other Standing Lumbar Exercises modified lean on table with kegel and stretch                    PT Short Term Goals - 04/09/20 0943      PT SHORT TERM GOAL #1   Title ind with toileting techniques    Status Achieved      PT SHORT TERM GOAL #2   Title ind with intial HEP    Status Achieved             PT Long Term Goals - 09/20/20 1645      PT LONG TERM GOAL #1   Title pt will be ind with advanced HEP    Time 12    Period Weeks    Status  On-going    Target Date 12/13/20      PT LONG TERM GOAL #2   Title Pt will be able to do zumba without leakage    Time 12    Period Weeks    Status New    Target Date 12/13/20      PT LONG TERM GOAL #3   Title Pt will report not having leakage or fecal smearing more than 1x/week    Time 12    Period Weeks    Status Revised      PT LONG TERM GOAL #4   Title Pt will not have any days with full incontinence of feces    Baseline has about 2x/week    Time 12    Period Weeks    Status Revised      PT LONG TERM GOAL #5   Title Pt will be able to have complete BM and no residual anal leakage of any amount at least 6/7 days per week    Time 12    Period Weeks    Status On-going                 Plan - 09/20/20 1743    Clinical Impression Statement Pt returned after not being able to get into PT for a while due to work.  Pt was not able to tolerate internal assessment today due to having increased symptoms and did not feel comfortable.  pt was able to start from basic pelvic floor exercise.  After gathering infomation of status update , PT feels that pt will benefit from skilled PT to continue to work  on strength and coordination .  Pt was educated on where to get internal stim probe and machine for strengthening.  She will benefit from skilled PT to also regain coordination and ability to relax after correctly contracting the pelvic floor muscle    Comorbidities hernia surgery, ovarian cyst surgery, chronic issue, other nerve related issues and muscle spasms in LE and glottis    Examination-Participation Restrictions Community Activity;Interpersonal Relationship;Occupation    Stability/Clinical Decision Making Evolving/Moderate complexity    PT Frequency 2x / week   reduce to 1x after 3-4 weeks   PT Duration 12 weeks    PT Treatment/Interventions ADLs/Self Care Home Management;Biofeedback;Cryotherapy;Electrical Stimulation;Moist Heat;Therapeutic activities;Therapeutic exercise;Neuromuscular re-education;Patient/family education;Manual techniques;Passive range of motion;Dry needling;Taping    PT Next Visit Plan core and pelvic floor progression, istim kegel unit, assess pelvic floor strength    Consulted and Agree with Plan of Care Patient           Patient will benefit from skilled therapeutic intervention in order to improve the following deficits and impairments:  Pain,Impaired sensation,Increased fascial restricitons,Decreased strength,Decreased skin integrity,Decreased range of motion,Decreased coordination,Increased muscle spasms,Impaired flexibility,Impaired tone,Postural dysfunction  Visit Diagnosis: Unspecified lack of coordination  Muscle weakness (generalized)  Cramp and spasm     Problem List Patient Active Problem List   Diagnosis Date Noted  . Allergic conjunctivitis 01/12/2020  . GERD (gastroesophageal reflux disease) 01/12/2020  . Cough, persistent 11/24/2019  . Seasonal and perennial allergic rhinoconjunctivitis 08/30/2018  . Moderate persistent asthma 08/30/2018  . Drug reaction 08/30/2018    Junious Silk, PT 09/20/2020, 5:53 PM  [facility] [address], [address] [place], [place], [address] Phone: [phone]   Fax:  [phone]  Name: Ninette Cotta MRN: [id number] Date of Birth: 05/06/1971

## 2020-09-23 ENCOUNTER — Ambulatory Visit: Payer: BC Managed Care – PPO | Admitting: Physical Therapy

## 2020-09-23 ENCOUNTER — Other Ambulatory Visit: Payer: Self-pay

## 2020-09-23 DIAGNOSIS — R252 Cramp and spasm: Secondary | ICD-10-CM

## 2020-09-23 DIAGNOSIS — M6281 Muscle weakness (generalized): Secondary | ICD-10-CM

## 2020-09-23 DIAGNOSIS — R279 Unspecified lack of coordination: Secondary | ICD-10-CM | POA: Diagnosis not present

## 2020-09-23 NOTE — Therapy (Signed)
[facility] [address], [address] [place], [place], [address] Phone: [phone]   Fax:  [phone]  Physical Therapy Treatment  Patient Details  Name: Bonnie Lowe MRN: [id number] Date of Birth: [birth year] Referring Provider (PT): Romie Levee, MD   Encounter Date: 09/23/2020   PT End of Session - 09/23/20 0853    Visit Number 9    Date for PT Re-Evaluation 12/13/20    Authorization Type bcbs    PT Start Time 0851    PT Stop Time 0929    PT Time Calculation (min) 38 min    Activity Tolerance Patient tolerated treatment well    Behavior During Therapy [facility] for tasks assessed/performed           Past Medical History:  Diagnosis Date  . Angio-edema   . Asthma   . Hypertension     Past Surgical History:  Procedure Laterality Date  . NO PAST SURGERIES      There were no vitals filed for this visit.   Subjective Assessment - 09/23/20 0854    Subjective Pt states she is better today and cn tolerate internal assessment.                          Pelvic Floor Special Questions - 09/23/20 0001    Pelvic Floor Internal Exam pt identity confirmed and informed conset given to perform    Exam Type Rectal    Strength fair squeeze, definite lift    Strength # of reps 1   4 in 10 sec   Strength # of seconds 8   not max contraction   Tone high             [facility] Adult PT Treatment/Exercise - 09/23/20 0001      Self-Care   Other Self-Care Comments  pelvic wand and stretch to pelvic floor      Neuro Re-ed    Neuro Re-ed Details  tactile cues internally to relax and breathing with      Lumbar Exercises: Standing   Other Standing Lumbar Exercises modified lean on table with kegel and stretch - hold 1; rest 5 - 10x      Lumbar Exercises: Seated   Other Seated Lumbar Exercises sitting with pelvis neutral and kegel with focus on rest all the way                    PT Short Term Goals -  04/09/20 0943      PT SHORT TERM GOAL #1   Title ind with toileting techniques    Status Achieved      PT SHORT TERM GOAL #2   Title ind with intial HEP    Status Achieved             PT Long Term Goals - 09/20/20 1645      PT LONG TERM GOAL #1   Title pt will be ind with advanced HEP    Time 12    Period Weeks    Status On-going    Target Date 12/13/20      PT LONG TERM GOAL #2   Title Pt will be able to do zumba without leakage    Time 12    Period Weeks    Status New    Target Date 12/13/20      PT LONG TERM GOAL #3   Title Pt will report not having leakage  or fecal smearing more than 1x/week    Time 12    Period Weeks    Status Revised      PT LONG TERM GOAL #4   Title Pt will not have any days with full incontinence of feces    Baseline has about 2x/week    Time 12    Period Weeks    Status Revised      PT LONG TERM GOAL #5   Title Pt will be able to have complete BM and no residual anal leakage of any amount at least 6/7 days per week    Time 12    Period Weeks    Status On-going                 Plan - 09/23/20 0951    Clinical Impression Statement Pt was assessed for pelvic floor strength and coordination rectally today. Pt tightens when attempting to evacuate.  max contraction is 3/5 but cannot sustain it for more than 2 seconds . Pt does not fully relax between reps.  Pt was educated in pelvic wand and focus on relaxing between reps. Pt did well with focus on rest in sitting and standing leaning on table.  pt will benefit from skilled PT to continue working on endurance and coordination.    Examination-Participation Restrictions Community Activity;Interpersonal Relationship;Occupation    PT Treatment/Interventions ADLs/Self Care Home Management;Biofeedback;Cryotherapy;Electrical Stimulation;Moist Heat;Therapeutic activities;Therapeutic exercise;Neuromuscular re-education;Patient/family education;Manual techniques;Passive range of motion;Dry  needling;Taping    PT Next Visit Plan pelvic floor stretch and internal vaginally to release puborectlis in the front if tolerates; sit to stand and squat eccentric control    PT Home Exercise Plan toileting - add hs and piriformis stretch, thoracic flex and rotation; Access Code: [email or web address]    Consulted and Agree with Plan of Care Patient           Patient will benefit from skilled therapeutic intervention in order to improve the following deficits and impairments:  Pain,Impaired sensation,Increased fascial restricitons,Decreased strength,Decreased skin integrity,Decreased range of motion,Decreased coordination,Increased muscle spasms,Impaired flexibility,Impaired tone,Postural dysfunction  Visit Diagnosis: Unspecified lack of coordination  Muscle weakness (generalized)  Cramp and spasm     Problem List Patient Active Problem List   Diagnosis Date Noted  . Allergic conjunctivitis 01/12/2020  . GERD (gastroesophageal reflux disease) 01/12/2020  . Cough, persistent 11/24/2019  . Seasonal and perennial allergic rhinoconjunctivitis 08/30/2018  . Moderate persistent asthma 08/30/2018  . Drug reaction 08/30/2018    Junious Silk, PT 09/23/2020, 10:15 AM  [facility] [address] [place], [place], [address] Phone: [phone]   Fax:  [phone]  Name: Bonnie Lowe MRN: [id number] Date of Birth: 15-Mar-1971

## 2020-09-23 NOTE — Patient Instructions (Addendum)
STRETCHING THE PELVIC FLOOR MUSCLES NO DILATOR  Supplies . Vaginal lubricant (can do same thing rectally) . Mirror (optional) . Gloves (optional) or clean hands Positioning . Start in a semi-reclined position with your head propped up. Bend your knees and place your thumb or finger at the vaginal opening. Procedure . Apply a moderate amount of lubricant on the outer skin of your vagina, the labia minora.  Apply additional lubricant to your finger. Marland Kitchen Spread the skin away from the vaginal opening. Place the end of your finger at the opening. . Do a maximum contraction of the pelvic floor muscles. Tighten the vagina and the anus maximally and relax. . When you know they are relaxed, gently and slowly insert your finger into your vagina, directing your finger slightly downward, for 2-3 inches of insertion. . Relax and stretch the 6 o'clock position . Hold each stretch for _30-60 seconds, no pain more than 3/10 . Repeat the stretching in the 4 o'clock and 8 o'clock positions. . Next gently move your finger in a "U" shape  several times.  . You can also enter a second finger to work to spread the vaginal opening wider from 3:00-6:00 and 6:00-9:00 or 3:00-9:00 . Perform daily or every other day . Once you have accomplished the techniques you may try them in standing with one foot resting on the tub, or in other positions.  This is a good stretch to do in the shower if you don't need to use lubricant.

## 2020-09-28 ENCOUNTER — Ambulatory Visit: Payer: BC Managed Care – PPO | Admitting: Physical Therapy

## 2020-09-28 ENCOUNTER — Encounter: Payer: Self-pay | Admitting: Physical Therapy

## 2020-09-28 ENCOUNTER — Ambulatory Visit (INDEPENDENT_AMBULATORY_CARE_PROVIDER_SITE_OTHER): Payer: BC Managed Care – PPO

## 2020-09-28 ENCOUNTER — Other Ambulatory Visit: Payer: Self-pay

## 2020-09-28 DIAGNOSIS — R279 Unspecified lack of coordination: Secondary | ICD-10-CM | POA: Diagnosis not present

## 2020-09-28 DIAGNOSIS — M6281 Muscle weakness (generalized): Secondary | ICD-10-CM

## 2020-09-28 DIAGNOSIS — R252 Cramp and spasm: Secondary | ICD-10-CM

## 2020-09-28 DIAGNOSIS — J309 Allergic rhinitis, unspecified: Secondary | ICD-10-CM | POA: Diagnosis not present

## 2020-09-28 NOTE — Therapy (Signed)
[facility] [address], [address] [place], [place], [address] Phone: [phone]   Fax:  [phone]  Physical Therapy Treatment  Patient Details  Name: Bonnie Lowe MRN: [id number] Date of Birth: 13-Feb-1971 Referring Provider (PT): Romie Levee, MD   Encounter Date: 09/28/2020   PT End of Session - 09/28/20 0811    Visit Number 10    Date for PT Re-Evaluation 12/13/20    Authorization Type bcbs    PT Start Time 0804    PT Stop Time 0844    PT Time Calculation (min) 40 min    Activity Tolerance Patient tolerated treatment well    Behavior During Therapy [facility] for tasks assessed/performed           Past Medical History:  Diagnosis Date  . Angio-edema   . Asthma   . Hypertension     Past Surgical History:  Procedure Laterality Date  . NO PAST SURGERIES      There were no vitals filed for this visit.   Subjective Assessment - 09/28/20 0810    Subjective I have been running around all weekend. I haven't tried using the stim unit.    Pertinent History hx of symtpoms possibly MS related, ovarian cysts removed; hernia surgery, swallowing    Diagnostic tests MRI recently for ruling out MS, endoscopy, colonoscopy    Patient Stated Goals able to stop having fecal leakage, not have surgery    Currently in Pain? No/denies                          Pelvic Floor Special Questions - 09/28/20 0001    Exam Type Vaginal    Strength fair squeeze, definite lift    Strength # of seconds 2    Tone high             [facility] Adult PT Treatment/Exercise - 09/28/20 0001      Lumbar Exercises: Stretches   Active Hamstring Stretch 30 seconds;Right;Left    Piriformis Stretch Right;Left;30 seconds    Other Lumbar Stretch Exercise lumbar flex and hs stretch at table      Manual Therapy   Manual Therapy Internal Pelvic Floor    Manual therapy comments pt informed and consent given to perform internal TC during  contract, relax, and bulge     Internal Pelvic Floor anteriorly, TP, OI, levators stretch and breathing bilateral                    PT Short Term Goals - 04/09/20 0943      PT SHORT TERM GOAL #1   Title ind with toileting techniques    Status Achieved      PT SHORT TERM GOAL #2   Title ind with intial HEP    Status Achieved             PT Long Term Goals - 09/28/20 0907      PT LONG TERM GOAL #2   Title Pt will be able to do zumba without leakage    Status On-going      PT LONG TERM GOAL #3   Title Pt will report not having leakage or fecal smearing more than 1x/week    Status On-going      PT LONG TERM GOAL #4   Title Pt will not have any days with full incontinence of feces    Status On-going      PT LONG  TERM GOAL #5   Title Pt will be able to have complete BM and no residual anal leakage of any amount at least 6/7 days per week    Status On-going                 Plan - 09/28/20 0856    Clinical Impression Statement Pt tolerated STM and stretching to pelvic floor anteriorly today.  Pt has high muscle tone and needed cues to do deep breathing.  Able to get more mobility after treatment today and gave stretches to maintain improved muscle length . Pt was educated on importance of doing exercises more consistantly in order to see result and know whether PT will work or not for her.  Pt will benefit from skilled PT to improve muscle coordination.    PT Treatment/Interventions ADLs/Self Care Home Management;Biofeedback;Cryotherapy;Electrical Stimulation;Moist Heat;Therapeutic activities;Therapeutic exercise;Neuromuscular re-education;Patient/family education;Manual techniques;Passive range of motion;Dry needling;Taping    PT Next Visit Plan f/u on stretches; discuss pelvic wand, child pose and deep breathing, gentle contract/relax in quadruped    PT Home Exercise Plan toileting - add hs and piriformis stretch, thoracic flex and rotation; Access Code:  [email or web address]    Consulted and Agree with Plan of Care Patient           Patient will benefit from skilled therapeutic intervention in order to improve the following deficits and impairments:  Pain,Impaired sensation,Increased fascial restricitons,Decreased strength,Decreased skin integrity,Decreased range of motion,Decreased coordination,Increased muscle spasms,Impaired flexibility,Impaired tone,Postural dysfunction  Visit Diagnosis: Unspecified lack of coordination  Muscle weakness (generalized)  Cramp and spasm     Problem List Patient Active Problem List   Diagnosis Date Noted  . Allergic conjunctivitis 01/12/2020  . GERD (gastroesophageal reflux disease) 01/12/2020  . Cough, persistent 11/24/2019  . Seasonal and perennial allergic rhinoconjunctivitis 08/30/2018  . Moderate persistent asthma 08/30/2018  . Drug reaction 08/30/2018    Junious Silk, PT 09/28/2020, 9:08 AM  [facility] [address], [address] [place], [place], [address] Phone: [phone]   Fax:  [phone]  Name: Bonnie Lowe MRN: [id number] Date of Birth: 07/04/1970

## 2020-10-04 ENCOUNTER — Telehealth: Payer: Self-pay | Admitting: Family Medicine

## 2020-10-04 ENCOUNTER — Ambulatory Visit (INDEPENDENT_AMBULATORY_CARE_PROVIDER_SITE_OTHER): Payer: BC Managed Care – PPO

## 2020-10-04 DIAGNOSIS — J309 Allergic rhinitis, unspecified: Secondary | ICD-10-CM

## 2020-10-04 MED ORDER — AZELASTINE-FLUTICASONE 137-50 MCG/ACT NA SUSP: NASAL | 2 refills | Status: DC

## 2020-10-04 NOTE — Telephone Encounter (Signed)
Pt having issues getting refill for Azelastine-Fluticasone would like a refill if possible.

## 2020-10-04 NOTE — Telephone Encounter (Signed)
Sent in refill for Azelastine to [facility] in [place], [place]

## 2020-10-05 ENCOUNTER — Ambulatory Visit: Payer: BC Managed Care – PPO | Admitting: Physical Therapy

## 2020-10-11 ENCOUNTER — Ambulatory Visit (INDEPENDENT_AMBULATORY_CARE_PROVIDER_SITE_OTHER): Payer: BC Managed Care – PPO

## 2020-10-11 DIAGNOSIS — J309 Allergic rhinitis, unspecified: Secondary | ICD-10-CM

## 2020-10-12 ENCOUNTER — Ambulatory Visit: Payer: BC Managed Care – PPO | Attending: General Surgery | Admitting: Physical Therapy

## 2020-10-12 ENCOUNTER — Encounter: Payer: Self-pay | Admitting: Physical Therapy

## 2020-10-12 ENCOUNTER — Other Ambulatory Visit: Payer: Self-pay

## 2020-10-12 DIAGNOSIS — M6281 Muscle weakness (generalized): Secondary | ICD-10-CM | POA: Diagnosis present

## 2020-10-12 DIAGNOSIS — R252 Cramp and spasm: Secondary | ICD-10-CM | POA: Insufficient documentation

## 2020-10-12 DIAGNOSIS — R279 Unspecified lack of coordination: Secondary | ICD-10-CM

## 2020-10-12 NOTE — Therapy (Signed)
[facility] [address], [address] [place], [place], [address] Phone: [phone]   Fax:  [phone]  Physical Therapy Treatment  Patient Details  Name: Bonnie Lowe MRN: [id number] Date of Birth: November 24, 1970 Referring Provider (PT): Romie Levee, MD   Encounter Date: 10/12/2020   PT End of Session - 10/12/20 1542    Visit Number 11    Date for PT Re-Evaluation 12/13/20    Authorization Type bcbs    PT Start Time 1530    PT Stop Time 1615    PT Time Calculation (min) 45 min    Activity Tolerance Patient tolerated treatment well    Behavior During Therapy [facility] for tasks assessed/performed           Past Medical History:  Diagnosis Date  . Angio-edema   . Asthma   . Hypertension     Past Surgical History:  Procedure Laterality Date  . NO PAST SURGERIES      There were no vitals filed for this visit.   Subjective Assessment - 10/12/20 1620    Subjective Today is a bad day and have been clenching all day    Patient Stated Goals able to stop having fecal leakage, not have surgery    Currently in Pain? No/denies                             [facility] Adult PT Treatment/Exercise - 10/12/20 0001      Self-Care   Other Self-Care Comments  edu on food diary and types of fiber      Lumbar Exercises: Stretches   Active Hamstring Stretch 30 seconds;Right;Left    Piriformis Stretch Right;Left;30 seconds    Other Lumbar Stretch Exercise standing ext at table and wall - contract relax pelvic floor                    PT Short Term Goals - 04/09/20 0943      PT SHORT TERM GOAL #1   Title ind with toileting techniques    Status Achieved      PT SHORT TERM GOAL #2   Title ind with intial HEP    Status Achieved             PT Long Term Goals - 09/28/20 0907      PT LONG TERM GOAL #2   Title Pt will be able to do zumba without leakage    Status On-going      PT LONG TERM GOAL #3    Title Pt will report not having leakage or fecal smearing more than 1x/week    Status On-going      PT LONG TERM GOAL #4   Title Pt will not have any days with full incontinence of feces    Status On-going      PT LONG TERM GOAL #5   Title Pt will be able to have complete BM and no residual anal leakage of any amount at least 6/7 days per week    Status On-going                 Plan - 10/12/20 1612    Clinical Impression Statement Pt has been given information on types of fiber as she consistently reports leakage occurs with liquid or very soft stool.  She states she was not able to relax at all duringt the treatment.  Today's session was at the end  of the day and she has been clenching all day.  Pt was encouraged to keep a food journal in order to find out more clearly which types of foods work for her to improve stool.    PT Treatment/Interventions ADLs/Self Care Home Management;Biofeedback;Cryotherapy;Electrical Stimulation;Moist Heat;Therapeutic activities;Therapeutic exercise;Neuromuscular re-education;Patient/family education;Manual techniques;Passive range of motion;Dry needling;Taping    PT Next Visit Plan f/u on anorectal manometry and food journal, see if she was able to add soluble fiber    PT Home Exercise Plan toileting - add hs and piriformis stretch, thoracic flex and rotation; Access Code: [email or web address]    Consulted and Agree with Plan of Care Patient           Patient will benefit from skilled therapeutic intervention in order to improve the following deficits and impairments:  Pain,Impaired sensation,Increased fascial restricitons,Decreased strength,Decreased skin integrity,Decreased range of motion,Decreased coordination,Increased muscle spasms,Impaired flexibility,Impaired tone,Postural dysfunction  Visit Diagnosis: Unspecified lack of coordination  Muscle weakness (generalized)  Cramp and spasm     Problem List Patient Active Problem List   Diagnosis Date  Noted  . Allergic conjunctivitis 01/12/2020  . GERD (gastroesophageal reflux disease) 01/12/2020  . Cough, persistent 11/24/2019  . Seasonal and perennial allergic rhinoconjunctivitis 08/30/2018  . Moderate persistent asthma 08/30/2018  . Drug reaction 08/30/2018    Junious Silk, PT 10/12/2020, 4:21 PM   [facility] [address] [place], [place], [address] Phone: [phone]   Fax:  [phone]  Name: Bonnie Lowe MRN: [id number] Date of Birth: Oct 19, 1970

## 2020-10-12 NOTE — Patient Instructions (Addendum)

## 2020-10-18 ENCOUNTER — Other Ambulatory Visit (HOSPITAL_COMMUNITY): Payer: BC Managed Care – PPO

## 2020-10-19 ENCOUNTER — Encounter: Payer: BC Managed Care – PPO | Admitting: Physical Therapy

## 2020-10-20 ENCOUNTER — Ambulatory Visit (HOSPITAL_COMMUNITY): Admission: RE | Admit: 2020-10-20 | Payer: BC Managed Care – PPO | Source: Home / Self Care | Admitting: General Surgery

## 2020-10-20 ENCOUNTER — Encounter (HOSPITAL_COMMUNITY): Admission: RE | Payer: Self-pay | Source: Home / Self Care

## 2020-10-20 SURGERY — MANOMETRY, ANORECTAL

## 2020-10-26 NOTE — Progress Notes (Signed)
Follow Up Note  RE: Bonnie Lowe MRN: [id number] DOB: [birth year] Date of Office Visit: 10/27/2020  Referring provider: Hezzie Bump, F* Primary care provider: Hezzie Bump, FNP  Chief Complaint: Asthma (Was dx with covid may 3rd since then she is still having breathing issues she had had 2 rounds of antibiotics and 2 rounds of steroids and steroid injection)  History of Present Illness: I had the pleasure of seeing Bonnie Lowe for a follow up visit at the [facility] on 10/27/2020. She is a 50 y.o. female, who is being followed for asthma and allergic rhinoconjunctivitis on AIT. Her previous allergy office visit was on 05/25/2020 with Dr. Nunzio Cobbs. Today is a regular follow up visit.  Moderate persistent asthma ACT score 9. Patient had positive Covid-19 at home on May 3rd and still having issues with her breathing - difficult to get a deep breath, coughing, shortness of breath, wheezing. Denies fevers/chills. Patient was vaccinated and had 1 booster. She was at a fundraiser even a few days before where apparently 20 people developed Covid-19.  Treated with steroids and antibiotics (Augmentin and doxycycline) and slowly improving as outpatient.   Currently on Symbicort 2 puffs twice a day with spacer and rinsing mouth after each use and using albuterol 2 puffs twice a day since COVID-19 infection. Still taking montelukast 10mg  daily.  No additional flares since the last visit.   Seasonal and perennial allergic rhinoconjunctivitis 2021 skin testing positive to ragweed, weed, trees, mold, dust mites and dog.  Currently on allergy injections and doing well on it. It does seem to be helping but has been on it for 15 years. Taking zyrtec 10mg  daily, Singulair daily, dymista 2 spray per nostril 1-2 times a day, ipratropium 0.03 2 spray per nostril twice a day.  No nosebleeds.  Not needing any eye drops.   Assessment and Plan: Bonnie Lowe is a 50  y.o. female with: Moderate persistent asthma with acute exacerbation Was doing well up until she got Covid-19. Still having symptoms. Finishing up prednisone and antibiotics. No antivirals were prescribed.  ACT score 9.  Finish prednisone and antibiotics.  For the next 1-2 weeks do the following:  Albuterol nebulizer in the morning and at night before going to bed.  May use every 4-6 hours additionally if needed.  Then use Pulmicort 0.5mg  nebulizer afterwards in the morning and at night.  Daily controller medication(s):Symbicort Bonnie Lowe 2 puffs twice a day with spacer and rinse mouth afterwards.  Singulair 10mg  daily at night. During upper respiratory infections/asthma flares: ADD Pulmicort 0.5mg  nebulizer twice a day for 1-2 weeks until your breathing symptoms return to baseline.  May use albuterol rescue inhaler 2 puffs or nebulizer every 4 to 6 hours as needed for shortness of breath, chest tightness, coughing, and wheezing. May use albuterol rescue inhaler 2 puffs 5 to 15 minutes prior to strenuous physical activities. Monitor frequency of use.   Will get spirometry at next visit instead of today due to COVID-19 pandemic and trying to minimize any type of aerosolizing procedures at this time in the office.   Seasonal and perennial allergic rhinoconjunctivitis Past history - Perennial rhino conjunctivitis symptoms for the past 40+ years but much improved since started AIT 10 years ago. 2021 skin testing positive to ragweed, weed, trees, mold, dust mites and dog. Interim history - doing much better with the injections. Still has PND issues.  Continue environmental control measures as below.  Continue allergy injections- restart next  week if feeling better.   Use Atrovent (ipratropium) 0.03% 1-2 sprays per nostril twice a day as needed for runny nose/drainage.   Use dymista (fluticasone + azelastine nasal spray combination) 1 spray per nostril twice a day for nasal  congestion.  Nasal saline spray (i.e., Simply Saline) or nasal saline lavage (i.e., NeilMed) is recommended as needed and prior to medicated nasal sprays.  Continue Singulair 10mg  daily.   May use over the counter antihistamines such as Zyrtec (cetirizine), Claritin (loratadine), Allegra (fexofenadine), or Xyzal (levocetirizine) daily as needed. And may take it TWICE a day for allergy flares.   Drug reaction Past history - Reactions to Avalox, sulfa and erythromycin in the past in the form of pruritus.  Continue to avoid.   Return in about 6 weeks (around 12/08/2020).  Meds ordered this encounter  Medications  . albuterol (PROVENTIL) (2.5 MG/3ML) 0.083% nebulizer solution    Sig: Take 3 mLs (2.5 mg total) by nebulization every 4 (four) hours as needed for wheezing or shortness of breath (coughing fits).    Dispense:  75 mL    Refill:  2  . budesonide (PULMICORT) 0.5 MG/2ML nebulizer solution    Sig: Take 2 mLs (0.5 mg total) by nebulization in the morning and at bedtime. Use for 1-2 weeks at a time during upper respiratory infections.    Dispense:  60 mL    Refill:  2   Lab Orders  No laboratory test(s) ordered today    Diagnostics: None.   Medication List:  Current Outpatient Medications  Medication Sig Dispense Refill  . albuterol (PROVENTIL) (2.5 MG/3ML) 0.083% nebulizer solution Take 3 mLs (2.5 mg total) by nebulization every 4 (four) hours as needed for wheezing or shortness of breath (coughing fits). 75 mL 2  . albuterol (VENTOLIN HFA) 108 (90 Base) MCG/ACT inhaler Inhale 2 puffs into the lungs every 4 (four) hours as needed. 18 g 1  . Azelastine-Fluticasone 137-50 MCG/ACT SUSP USE 1 SPRAY INTO EACH NOSTRIL TWICE A DAY 23 g 2  . budesonide (PULMICORT) 0.5 MG/2ML nebulizer solution Take 2 mLs (0.5 mg total) by nebulization in the morning and at bedtime. Use for 1-2 weeks at a time during upper respiratory infections. 60 mL 2  . budesonide-formoterol (SYMBICORT) 160-4.5  MCG/ACT inhaler INHALE 2 PUFFS INTO THE LUNGS TWICE DAILY 10.2 g 5  . cetirizine (ZYRTEC) 10 MG tablet Take 10 mg by mouth 2 (two) times daily.     . Cholecalciferol 25 MCG (1000 UT) tablet Take 1,000 Units by mouth daily.    12/10/2020 EPINEPHrine (AUVI-Q) 0.3 mg/0.3 mL IJ SOAJ injection Inject 0.3 mLs (0.3 mg total) into the muscle as needed for anaphylaxis. 2 each 1  . ibuprofen (ADVIL,MOTRIN) 800 MG tablet Take 800 mg by mouth 3 (three) times daily as needed.    Marland Kitchen ipratropium (ATROVENT) 0.03 % nasal spray 1-2 sprays each nostril 2-3 times daily as needed 30 mL 5  . levothyroxine (SYNTHROID) 75 MCG tablet Take 75 mcg by mouth daily before breakfast.    . liothyronine (CYTOMEL) 5 MCG tablet Take 5 mcg by mouth daily.    Marland Kitchen losartan (COZAAR) 50 MG tablet Take 50 mg by mouth daily.    . montelukast (SINGULAIR) 10 MG tablet Take 10 mg by mouth at bedtime.    . NON FORMULARY 2 injections week    . pimecrolimus (ELIDEL) 1 % cream Apply 1 application topically 2 (two) times daily.    Marland Kitchen spironolactone (ALDACTONE) 25 MG tablet Take  25 mg by mouth daily.    . valACYclovir (VALTREX) 1000 MG tablet 1,000 mg as needed.    . zolpidem (AMBIEN) 10 MG tablet Take 10 mg by mouth at bedtime as needed.     Current Facility-Administered Medications  Medication Dose Route Frequency Provider Last Rate Last Admin  . predniSONE (DELTASONE) tablet 10 mg  10 mg Oral Q breakfast Bobbitt, Heywood Iles, MD       Allergies: Allergies  Allergen Reactions  . Moxifloxacin Hcl Itching  . Citrus Diarrhea  . Erythromycin   . Sulfa Antibiotics    I reviewed her past medical history, social history, family history, and environmental history and no significant changes have been reported from her previous visit.  Review of Systems  Constitutional: Negative for appetite change, chills, fever and unexpected weight change.  HENT: Positive for postnasal drip. Negative for congestion and rhinorrhea.   Eyes: Negative for itching.   Respiratory: Positive for cough, chest tightness, shortness of breath and wheezing.   Cardiovascular: Negative for chest pain.  Gastrointestinal: Negative for abdominal pain.  Genitourinary: Negative for difficulty urinating.  Skin: Negative for rash.  Allergic/Immunologic: Positive for environmental allergies. Negative for food allergies.  Neurological: Negative for headaches.   Objective: BP 128/76   Pulse (!) 124   Temp 98.3 F (36.8 C) (Temporal)   Resp 20   SpO2 97%  There is no height or weight on file to calculate BMI. Physical Exam Vitals and nursing note reviewed.  Constitutional:      Appearance: Normal appearance. She is well-developed.  HENT:     Head: Normocephalic and atraumatic.     Right Ear: Tympanic membrane and external ear normal.     Left Ear: Tympanic membrane and external ear normal.     Nose: Nose normal.     Mouth/Throat:     Mouth: Mucous membranes are moist.     Pharynx: Oropharynx is clear.  Eyes:     Conjunctiva/sclera: Conjunctivae normal.  Cardiovascular:     Rate and Rhythm: Normal rate and regular rhythm.     Heart sounds: Normal heart sounds. No murmur heard. No friction rub. No gallop.   Pulmonary:     Effort: Pulmonary effort is normal.     Breath sounds: Normal breath sounds. No wheezing or rales.  Musculoskeletal:     Cervical back: Neck supple.  Skin:    General: Skin is warm.     Findings: No rash.  Neurological:     Mental Status: She is alert and oriented to person, place, and time.  Psychiatric:        Behavior: Behavior normal.    Previous notes and tests were reviewed. The plan was reviewed with the patient/family, and all questions/concerned were addressed.  It was my pleasure to see Bonnie Lowe today and participate in her care. Please feel free to contact me with any questions or concerns.  Sincerely,  Wyline Mood, DO Allergy & Immunology  [facility] office:  [phone] [facility] office: [phone]

## 2020-10-27 ENCOUNTER — Encounter: Payer: Self-pay | Admitting: Allergy

## 2020-10-27 ENCOUNTER — Other Ambulatory Visit: Payer: Self-pay

## 2020-10-27 ENCOUNTER — Ambulatory Visit (INDEPENDENT_AMBULATORY_CARE_PROVIDER_SITE_OTHER): Payer: BC Managed Care – PPO | Admitting: Allergy

## 2020-10-27 VITALS — BP 128/76 | HR 124 | Temp 98.3°F | Resp 20

## 2020-10-27 DIAGNOSIS — J4530 Mild persistent asthma, uncomplicated: Secondary | ICD-10-CM

## 2020-10-27 DIAGNOSIS — J4541 Moderate persistent asthma with (acute) exacerbation: Secondary | ICD-10-CM | POA: Insufficient documentation

## 2020-10-27 DIAGNOSIS — J4531 Mild persistent asthma with (acute) exacerbation: Secondary | ICD-10-CM | POA: Insufficient documentation

## 2020-10-27 DIAGNOSIS — J3089 Other allergic rhinitis: Secondary | ICD-10-CM

## 2020-10-27 DIAGNOSIS — T50905D Adverse effect of unspecified drugs, medicaments and biological substances, subsequent encounter: Secondary | ICD-10-CM

## 2020-10-27 DIAGNOSIS — J302 Other seasonal allergic rhinitis: Secondary | ICD-10-CM

## 2020-10-27 MED ORDER — BUDESONIDE 0.5 MG/2ML IN SUSP: 0.5000 mg | Freq: Two times a day (BID) | RESPIRATORY_TRACT | 2 refills | Status: DC

## 2020-10-27 MED ORDER — ALBUTEROL SULFATE (2.5 MG/3ML) 0.083% IN NEBU: 2.5000 mg | INHALATION_SOLUTION | RESPIRATORY_TRACT | 2 refills | Status: DC | PRN

## 2020-10-27 NOTE — Patient Instructions (Addendum)
Mild persistent asthma   Finish prednisone and antibiotics.  For the next 1-2 weeks do the following:  Albuterol nebulizer in the morning and at night before going to bed.  May use every 4-6 hours additionally if needed.  Then use Pulmicort 0.5mg  nebulizer afterwards in the morning and at night.  Then use the Symbicort 2 puffs twice a day.   Daily controller medication(s):Symbicort 2 puffs twice a day with spacer and rinse mouth afterwards.  Singulair 10mg  daily at night. During upper respiratory infections/asthma flares: ADD Pulmicort 0.5mg  nebulizer twice a day for 1-2 weeks until your breathing symptoms return to baseline.  May use albuterol rescue inhaler 2 puffs or nebulizer every 4 to 6 hours as needed for shortness of breath, chest tightness, coughing, and wheezing. May use albuterol rescue inhaler 2 puffs 5 to 15 minutes prior to strenuous physical activities. Monitor frequency of use.  Asthma control goals:  Full participation in all desired activities (may need albuterol before activity) Albuterol use two times or less a week on average (not counting use with activity) Cough interfering with sleep two times or less a month Oral steroids no more than once a year No hospitalizations  Seasonal and perennial allergic rhinoconjunctivitis  2021 skin testing positive to ragweed, weed, trees, mold, dust mites and dog.  Continue environmental control measures as below.  Continue allergy injections- restart next week if feeling better.   Use Atrovent (ipratropium) 0.03% 1-2 sprays per nostril twice a day as needed for runny nose/drainage.   Use dymista (fluticasone + azelastine nasal spray combination) 1 spray per nostril twice a day for nasal congestion.  Nasal saline spray (i.e., Simply Saline) or nasal saline lavage (i.e., NeilMed) is recommended as needed and prior to medicated nasal sprays.  Continue Singulair 10mg  daily.   May use over the counter  antihistamines such as Zyrtec (cetirizine), Claritin (loratadine), Allegra (fexofenadine), or Xyzal (levocetirizine) daily as needed. And may take it TWICE a day for allergy flares.   Drug reaction Reactions to Avalox, sulfa and erythromycin in the past in the form of pruritus.  Continue to avoid above medications for now.   Follow up in 6 weeks or sooner if needed with me in [facility].  Reducing Pollen Exposure . Pollen seasons: trees (spring), grass (summer) and ragweed/weeds (fall). [birth year]1982 Keep windows closed in your home and car to lower pollen exposure.  10-19-1980 air conditioning in the bedroom and throughout the house if possible.  . Avoid going out in dry windy days - especially early morning. . Pollen counts are highest between 5 - 10 AM and on dry, hot and windy days.  . Save outside activities for late afternoon or after a heavy rain, when pollen levels are lower.  . Avoid mowing of grass if you have grass pollen allergy. Marland Kitchen Be aware that pollen can also be transported indoors on people and pets.  . Dry your clothes in an automatic dryer rather than hanging them outside where they might collect pollen.  . Rinse hair and eyes before bedtime. Control of House Dust Mite Allergen . Dust mite allergens are a common trigger of allergy and asthma symptoms. While they can be found throughout the house, these microscopic creatures thrive in warm, humid environments such as bedding, upholstered furniture and carpeting. . Because so much time is spent in the bedroom, it is essential to reduce mite levels there.  . Encase pillows, mattresses, and box springs in special allergen-proof fabric covers or  airtight, zippered plastic covers.  . Bedding should be washed weekly in hot water (130 F) and dried in a hot dryer. Allergen-proof covers are available for comforters and pillows that can't be regularly washed.  Reyes Ivan the allergy-proof covers every few months. Minimize clutter in the bedroom. Keep  pets out of the bedroom.  Marland Kitchen Keep humidity less than 50% by using a dehumidifier or air conditioning. You can buy a humidity measuring device called a hygrometer to monitor this.  . If possible, replace carpets with hardwood, linoleum, or washable area rugs. If that's not possible, vacuum frequently with a vacuum that has a HEPA filter. . Remove all upholstered furniture and non-washable window drapes from the bedroom. . Remove all non-washable stuffed toys from the bedroom.  Wash stuffed toys weekly. Pet Allergen Avoidance: . Contrary to popular opinion, there are no "hypoallergenic" breeds of dogs or cats. That is because people are not allergic to an animal's hair, but to an allergen found in the animal's saliva, dander (dead skin flakes) or urine. Pet allergy symptoms typically occur within minutes. For some people, symptoms can build up and become most severe 8 to 12 hours after contact with the animal. People with severe allergies can experience reactions in public places if dander has been transported on the pet owners' clothing. Marland Kitchen Keeping an animal outdoors is only a partial solution, since homes with pets in the yard still have higher concentrations of animal allergens. . Before getting a pet, ask your allergist to determine if you are allergic to animals. If your pet is already considered part of your family, try to minimize contact and keep the pet out of the bedroom and other rooms where you spend a great deal of time. . As with dust mites, vacuum carpets often or replace carpet with a hardwood floor, tile or linoleum. . High-efficiency particulate air (HEPA) cleaners can reduce allergen levels over time. . While dander and saliva are the source of cat and dog allergens, urine is the source of allergens from rabbits, hamsters, mice and [place] pigs; so ask a non-allergic family member to clean the animal's cage. . If you have a pet allergy, talk to your allergist about the potential for allergy  immunotherapy (allergy shots). This strategy can often provide long-term relief. Mold Control . Mold and fungi can grow on a variety of surfaces provided certain temperature and moisture conditions exist.  . Outdoor molds grow on plants, decaying vegetation and soil. The major outdoor mold, Alternaria and Cladosporium, are found in very high numbers during hot and dry conditions. Generally, a late summer - fall peak is seen for common outdoor fungal spores. Rain will temporarily lower outdoor mold spore count, but counts rise rapidly when the rainy period ends. . The most important indoor molds are Aspergillus and Penicillium. Dark, humid and poorly ventilated basements are ideal sites for mold growth. The next most common sites of mold growth are the bathroom and the kitchen. Outdoor (Seasonal) Mold Control . Use air conditioning and keep windows closed. . Avoid exposure to decaying vegetation. Marland Kitchen Avoid leaf raking. . Avoid grain handling. . Consider wearing a face mask if working in moldy areas.  Indoor (Perennial) Mold Control  . Maintain humidity below 50%. . Get rid of mold growth on hard surfaces with water, detergent and, if necessary, 5% bleach (do not mix with other cleaners). Then dry the area completely. If mold covers an area more than 10 square feet, consider hiring an indoor environmental  professional. . For clothing, washing with soap and water is best. If moldy items cannot be cleaned and dried, throw them away. . Remove sources e.g. contaminated carpets. . Repair and seal leaking roofs or pipes. Using dehumidifiers in damp basements may be helpful, but empty the water and clean units regularly to prevent mildew from forming. All rooms, especially basements, bathrooms and kitchens, require ventilation and cleaning to deter mold and mildew growth. Avoid carpeting on concrete or damp floors, and storing items in damp areas.

## 2020-10-27 NOTE — Assessment & Plan Note (Signed)
Was doing well up until she got Covid-19. Still having symptoms. Finishing up prednisone and antibiotics. No antivirals were prescribed.  ACT score 9.  Finish prednisone and antibiotics.  For the next 1-2 weeks do the following:  Albuterol nebulizer in the morning and at night before going to bed.  May use every 4-6 hours additionally if needed.  Then use Pulmicort 0.5mg  nebulizer afterwards in the morning and at night.  Daily controller medication(s):Symbicort 2 puffs twice a day with spacer and rinse mouth afterwards.  Singulair 10mg  daily at night. During upper respiratory infections/asthma flares: ADD Pulmicort 0.5mg  nebulizer twice a day for 1-2 weeks until your breathing symptoms return to baseline.  May use albuterol rescue inhaler 2 puffs or nebulizer every 4 to 6 hours as needed for shortness of breath, chest tightness, coughing, and wheezing. May use albuterol rescue inhaler 2 puffs 5 to 15 minutes prior to strenuous physical activities. Monitor frequency of use.   Will get spirometry at next visit instead of today due to COVID-19 pandemic and trying to minimize any type of aerosolizing procedures at this time in the office.

## 2020-10-27 NOTE — Assessment & Plan Note (Signed)
Past history - Perennial rhino conjunctivitis symptoms for the past 40+ years but much improved since started AIT 10 years ago. 2021 skin testing positive to ragweed, weed, trees, mold, dust mites and dog. Interim history - doing much better with the injections. Still has PND issues.  Continue environmental control measures as below.  Continue allergy injections- restart next week if feeling better.   Use Atrovent (ipratropium) 0.03% 1-2 sprays per nostril twice a day as needed for runny nose/drainage.   Use dymista (fluticasone + azelastine nasal spray combination) 1 spray per nostril twice a day for nasal congestion.  Nasal saline spray (i.e., Simply Saline) or nasal saline lavage (i.e., NeilMed) is recommended as needed and prior to medicated nasal sprays.  Continue Singulair 10mg  daily.   May use over the counter antihistamines such as Zyrtec (cetirizine), Claritin (loratadine), Allegra (fexofenadine), or Xyzal (levocetirizine) daily as needed. And may take it TWICE a day for allergy flares.

## 2020-10-27 NOTE — Assessment & Plan Note (Signed)
Past history - Reactions to Avalox, sulfa and erythromycin in the past in the form of pruritus.  Continue to avoid.  

## 2020-11-01 ENCOUNTER — Encounter: Payer: BC Managed Care – PPO | Admitting: Physical Therapy

## 2020-11-05 ENCOUNTER — Ambulatory Visit (INDEPENDENT_AMBULATORY_CARE_PROVIDER_SITE_OTHER): Payer: BC Managed Care – PPO | Admitting: *Deleted

## 2020-11-05 ENCOUNTER — Other Ambulatory Visit: Payer: Self-pay

## 2020-11-05 DIAGNOSIS — J309 Allergic rhinitis, unspecified: Secondary | ICD-10-CM

## 2020-11-05 MED ORDER — BUDESONIDE-FORMOTEROL FUMARATE 160-4.5 MCG/ACT IN AERO: 2.0000 | INHALATION_SPRAY | Freq: Two times a day (BID) | RESPIRATORY_TRACT | 5 refills | Status: DC

## 2020-11-11 ENCOUNTER — Ambulatory Visit: Payer: BC Managed Care – PPO | Attending: General Surgery | Admitting: Physical Therapy

## 2020-11-11 ENCOUNTER — Encounter: Payer: Self-pay | Admitting: Physical Therapy

## 2020-11-11 ENCOUNTER — Other Ambulatory Visit: Payer: Self-pay

## 2020-11-11 DIAGNOSIS — R252 Cramp and spasm: Secondary | ICD-10-CM | POA: Insufficient documentation

## 2020-11-11 DIAGNOSIS — R279 Unspecified lack of coordination: Secondary | ICD-10-CM | POA: Insufficient documentation

## 2020-11-11 DIAGNOSIS — M6281 Muscle weakness (generalized): Secondary | ICD-10-CM | POA: Insufficient documentation

## 2020-11-11 NOTE — Therapy (Signed)
[facility] [address] [place], [place], [address] Phone: [phone]   Fax:  [phone]  Physical Therapy Treatment  Patient Details  Name: Celestina Gironda MRN: [id number] Date of Birth: Dec 29, 1970 Referring Provider (PT): Romie Levee, MD   Encounter Date: 11/11/2020   PT End of Session - 11/11/20 0815    Visit Number 12    Date for PT Re-Evaluation 12/13/20    Authorization Type bcbs    PT Start Time 0802    PT Stop Time 0842    PT Time Calculation (min) 40 min    Activity Tolerance Patient tolerated treatment well    Behavior During Therapy [facility] for tasks assessed/performed           Past Medical History:  Diagnosis Date  . Angio-edema   . Asthma   . Hypertension     Past Surgical History:  Procedure Laterality Date  . NO PAST SURGERIES      There were no vitals filed for this visit.   Subjective Assessment - 11/11/20 0803    Subjective Pt had COVID and has not been able to do much over the last month.  Pt was on antibiotic and had more diarrhea.    Patient Stated Goals able to stop having fecal leakage, not have surgery                             [facility] Adult PT Treatment/Exercise - 11/11/20 0001      Manual Therapy   Manual therapy comments pt informed and consent given to perform internal TC during contract, relax, and bulge     Myofascial Release Rt abdomen    Internal Pelvic Floor anteriorly, TP, OI, levators stretch and breathing bilateral            Trigger Point Dry Needling - 11/11/20 0001    Education Handout Provided Yes                  PT Short Term Goals - 04/09/20 0943      PT SHORT TERM GOAL #1   Title ind with toileting techniques    Status Achieved      PT SHORT TERM GOAL #2   Title ind with intial HEP    Status Achieved             PT Long Term Goals - 09/28/20 0907      PT LONG TERM GOAL #2   Title Pt will be able to do  zumba without leakage    Status On-going      PT LONG TERM GOAL #3   Title Pt will report not having leakage or fecal smearing more than 1x/week    Status On-going      PT LONG TERM GOAL #4   Title Pt will not have any days with full incontinence of feces    Status On-going      PT LONG TERM GOAL #5   Title Pt will be able to have complete BM and no residual anal leakage of any amount at least 6/7 days per week    Status On-going                 Plan - 11/11/20 2376    Clinical Impression Statement Pt did have high tone pelvic floor bilt levators.  Pt had fascial restriction on the right abdomen that released with MFR.  Pt was  able to kegel correctly but still holding tension after doing the kegel. Pt will benefit from skilled PT to continue working on lifestyle changes and improved pelvic floor coordination.    PT Treatment/Interventions ADLs/Self Care Home Management;Biofeedback;Cryotherapy;Electrical Stimulation;Moist Heat;Therapeutic activities;Therapeutic exercise;Neuromuscular re-education;Patient/family education;Manual techniques;Passive range of motion;Dry needling;Taping    PT Next Visit Plan dry needling lumbar and internal STM breathing and downtraining pelvic floor    PT Home Exercise Plan toileting ; Access Code: [email or web address]    Consulted and Agree with Plan of Care Patient           Patient will benefit from skilled therapeutic intervention in order to improve the following deficits and impairments:  Pain,Impaired sensation,Increased fascial restricitons,Decreased strength,Decreased skin integrity,Decreased range of motion,Decreased coordination,Increased muscle spasms,Impaired flexibility,Impaired tone,Postural dysfunction  Visit Diagnosis: Unspecified lack of coordination  Muscle weakness (generalized)  Cramp and spasm     Problem List Patient Active Problem List   Diagnosis Date Noted  . Moderate persistent asthma with acute exacerbation 10/27/2020  .  GERD (gastroesophageal reflux disease) 01/12/2020  . Cough, persistent 11/24/2019  . Seasonal and perennial allergic rhinoconjunctivitis 08/30/2018  . Drug reaction 08/30/2018    Junious Silk, PT 11/11/2020, 9:20 AM  [facility] [address] [place], [place], [address] Phone: [phone]   Fax:  [phone]  Name: Kaysen Sefcik MRN: [id number] Date of Birth: 1970-12-29

## 2020-11-16 ENCOUNTER — Ambulatory Visit (INDEPENDENT_AMBULATORY_CARE_PROVIDER_SITE_OTHER): Payer: BC Managed Care – PPO

## 2020-11-16 DIAGNOSIS — J309 Allergic rhinitis, unspecified: Secondary | ICD-10-CM

## 2020-11-18 ENCOUNTER — Other Ambulatory Visit: Payer: Self-pay

## 2020-11-18 ENCOUNTER — Ambulatory Visit: Payer: BC Managed Care – PPO | Admitting: Physical Therapy

## 2020-11-18 DIAGNOSIS — M6281 Muscle weakness (generalized): Secondary | ICD-10-CM

## 2020-11-18 DIAGNOSIS — R279 Unspecified lack of coordination: Secondary | ICD-10-CM

## 2020-11-18 NOTE — Therapy (Signed)
[facility] [address], [address] [place], [place], [address] Phone: [phone]   Fax:  [phone]  Physical Therapy Treatment  Patient Details  Name: Bonnie Lowe MRN: [id number] Date of Birth: 05/13/1971 Referring Provider (PT): Romie Levee, MD   Encounter Date: 11/18/2020   PT End of Session - 11/18/20 0842     Visit Number 13    Date for PT Re-Evaluation 12/13/20    Authorization Type bcbs    PT Start Time 0802    PT Stop Time 0845    PT Time Calculation (min) 43 min    Activity Tolerance Patient tolerated treatment well    Behavior During Therapy [facility] for tasks assessed/performed             Past Medical History:  Diagnosis Date   Angio-edema    Asthma    Hypertension     Past Surgical History:  Procedure Laterality Date   NO PAST SURGERIES      There were no vitals filed for this visit.   Subjective Assessment - 11/18/20 0841     Subjective Pt feels like her back was sore after working in the yard this weekend    Patient Stated Goals able to stop having fecal leakage, not have surgery    Currently in Pain? No/denies                               [facility] Adult PT Treatment/Exercise - 11/18/20 0001       Modalities   Modalities Electrical Stimulation      Electrical Stimulation   Electrical Stimulation Location lumbar    Electrical Stimulation Action IFC    Electrical Stimulation Parameters to tolerance    Electrical Stimulation Goals Pain      Manual Therapy   Manual Therapy Soft tissue mobilization    Soft tissue mobilization thoracic and lumbar paraspinals and fascial release, bilat gluteals              Trigger Point Dry Needling - 11/18/20 0001     Consent Given? Yes    Education Handout Provided Previously provided    Muscles Treated Back/Hip Gluteus maximus;Gluteus medius;Thoracic multifidi;Lumbar multifidi    Gluteus Medius Response Twitch response  elicited;Palpable increased muscle length    Gluteus Maximus Response Twitch response elicited;Palpable increased muscle length    Lumbar multifidi Response Twitch response elicited;Palpable increased muscle length    Thoracic multifidi response Twitch response elicited;Palpable increased muscle length                    PT Short Term Goals - 04/09/20 0943       PT SHORT TERM GOAL #1   Title ind with toileting techniques    Status Achieved      PT SHORT TERM GOAL #2   Title ind with intial HEP    Status Achieved               PT Long Term Goals - 09/28/20 9449       PT LONG TERM GOAL #2   Title Pt will be able to do zumba without leakage    Status On-going      PT LONG TERM GOAL #3   Title Pt will report not having leakage or fecal smearing more than 1x/week    Status On-going      PT LONG TERM GOAL #4   Title Pt  will not have any days with full incontinence of feces    Status On-going      PT LONG TERM GOAL #5   Title Pt will be able to have complete BM and no residual anal leakage of any amount at least 6/7 days per week    Status On-going                   Plan - 11/18/20 2841     Clinical Impression Statement Pt responded well to manual and dry needling with increased muscle lengh upon palpation.  Pt was given estim to ensure good response and minimize pain after treatment.  Pt will benefit from skilled PT to continue working on improved muscle tone and muscle coordination.    PT Treatment/Interventions ADLs/Self Care Home Management;Biofeedback;Cryotherapy;Electrical Stimulation;Moist Heat;Therapeutic activities;Therapeutic exercise;Neuromuscular re-education;Patient/family education;Manual techniques;Passive range of motion;Dry needling;Taping    PT Next Visit Plan f/u on dry needling lumbar #1 and do again if tolerated well, stim and internal STM breathing and downtraining pelvic floor    PT Home Exercise Plan toileting ; Access Code:  [email or web address]    Consulted and Agree with Plan of Care Patient             Patient will benefit from skilled therapeutic intervention in order to improve the following deficits and impairments:  Pain, Impaired sensation, Increased fascial restricitons, Decreased strength, Decreased skin integrity, Decreased range of motion, Decreased coordination, Increased muscle spasms, Impaired flexibility, Impaired tone, Postural dysfunction  Visit Diagnosis: Unspecified lack of coordination  Muscle weakness (generalized)     Problem List Patient Active Problem List   Diagnosis Date Noted   Moderate persistent asthma with acute exacerbation 10/27/2020   GERD (gastroesophageal reflux disease) 01/12/2020   Cough, persistent 11/24/2019   Seasonal and perennial allergic rhinoconjunctivitis 08/30/2018   Drug reaction 08/30/2018    Brayton Caves Kymberlie Brazeau, PT 11/18/2020, 8:42 AM  [facility] [address], [address] [place], [place], [address] Phone: [phone]   Fax:  [phone]  Name: Bonnie Lowe MRN: [id number] Date of Birth: Feb 24, 1971

## 2020-11-24 ENCOUNTER — Ambulatory Visit (INDEPENDENT_AMBULATORY_CARE_PROVIDER_SITE_OTHER): Payer: BC Managed Care – PPO

## 2020-11-24 DIAGNOSIS — J309 Allergic rhinitis, unspecified: Secondary | ICD-10-CM | POA: Diagnosis not present

## 2020-11-25 ENCOUNTER — Other Ambulatory Visit: Payer: Self-pay

## 2020-11-25 ENCOUNTER — Ambulatory Visit: Payer: BC Managed Care – PPO | Admitting: Physical Therapy

## 2020-11-25 ENCOUNTER — Encounter: Payer: Self-pay | Admitting: Physical Therapy

## 2020-11-25 DIAGNOSIS — R279 Unspecified lack of coordination: Secondary | ICD-10-CM | POA: Diagnosis not present

## 2020-11-25 DIAGNOSIS — R252 Cramp and spasm: Secondary | ICD-10-CM

## 2020-11-25 DIAGNOSIS — M6281 Muscle weakness (generalized): Secondary | ICD-10-CM

## 2020-11-25 NOTE — Therapy (Signed)
[facility] [address] [place], [place], [address] Phone: [phone]   Fax:  [phone]  Physical Therapy Treatment  Patient Details  Name: Bonnie Lowe MRN: [id number] Date of Birth: [birth year] Referring Provider (PT): Romie Levee, MD   Encounter Date: 11/25/2020   PT End of Session - 11/25/20 0809     Visit Number 14    Date for PT Re-Evaluation 12/13/20    Authorization Type bcbs    PT Start Time 0805    PT Stop Time 0837    PT Time Calculation (min) 32 min    Activity Tolerance Patient tolerated treatment well    Behavior During Therapy [facility] for tasks assessed/performed             Past Medical History:  Diagnosis Date   Angio-edema    Asthma    Hypertension     Past Surgical History:  Procedure Laterality Date   NO PAST SURGERIES      There were no vitals filed for this visit.   Subjective Assessment - 11/25/20 0808     Subjective Pt states she is a little sore in her back.  No pain currently now, but when moving it is 1-2/10 and higher if lifting.  Leakage was a little less this week.    Pertinent History hx of symtpoms possibly MS related, ovarian cysts removed; hernia surgery, swallowing    Diagnostic tests MRI recently for ruling out MS, endoscopy, colonoscopy    Patient Stated Goals able to stop having fecal leakage, not have surgery    Currently in Pain? No/denies                               [facility] Adult PT Treatment/Exercise - 11/25/20 0001       Lumbar Exercises: Stretches   Active Hamstring Stretch 30 seconds;Right;Left    Piriformis Stretch Right;Left;30 seconds      Manual Therapy   Soft tissue mobilization thoracic and lumbar paraspinals and fascial release, bilat gluteals              Trigger Point Dry Needling - 11/25/20 0001     Consent Given? Yes    Education Handout Provided Previously provided    Gluteus Medius Response Twitch  response elicited;Palpable increased muscle length    Gluteus Maximus Response Twitch response elicited;Palpable increased muscle length    Lumbar multifidi Response Twitch response elicited;Palpable increased muscle length    Thoracic multifidi response Twitch response elicited;Palpable increased muscle length                    PT Short Term Goals - 04/09/20 0943       PT SHORT TERM GOAL #1   Title ind with toileting techniques    Status Achieved      PT SHORT TERM GOAL #2   Title ind with intial HEP    Status Achieved               PT Long Term Goals - 09/28/20 0907       PT LONG TERM GOAL #2   Title Pt will be able to do zumba without leakage    Status On-going      PT LONG TERM GOAL #3   Title Pt will report not having leakage or fecal smearing more than 1x/week    Status On-going      PT LONG TERM  GOAL #4   Title Pt will not have any days with full incontinence of feces    Status On-going      PT LONG TERM GOAL #5   Title Pt will be able to have complete BM and no residual anal leakage of any amount at least 6/7 days per week    Status On-going                   Plan - 11/25/20 0841     Clinical Impression Statement Pt reports bowel symptoms maybe a little better since last time.  Pt had good releases from dry needling and is recommended to continue. She was given stretches and educated to continue stretching today to maintain improved soft tissue length.    PT Treatment/Interventions ADLs/Self Care Home Management;Biofeedback;Cryotherapy;Electrical Stimulation;Moist Heat;Therapeutic activities;Therapeutic exercise;Neuromuscular re-education;Patient/family education;Manual techniques;Passive range of motion;Dry needling;Taping    PT Next Visit Plan f/u on dry needling lumbar #2 and do again if tolerated well, stim and internal STM breathing and downtraining pelvic floor    PT Home Exercise Plan toileting ; Access Code: [email or web address]    Consulted  and Agree with Plan of Care Patient             Patient will benefit from skilled therapeutic intervention in order to improve the following deficits and impairments:  Pain, Impaired sensation, Increased fascial restricitons, Decreased strength, Decreased skin integrity, Decreased range of motion, Decreased coordination, Increased muscle spasms, Impaired flexibility, Impaired tone, Postural dysfunction  Visit Diagnosis: Unspecified lack of coordination  Muscle weakness (generalized)  Cramp and spasm     Problem List Patient Active Problem List   Diagnosis Date Noted   Moderate persistent asthma with acute exacerbation 10/27/2020   GERD (gastroesophageal reflux disease) 01/12/2020   Cough, persistent 11/24/2019   Seasonal and perennial allergic rhinoconjunctivitis 08/30/2018   Drug reaction 08/30/2018    Brayton Caves Braison Snoke, PT 11/25/2020, 8:44 AM  [facility] [address], [address] [place], [place], [address] Phone: [phone]   Fax:  [phone]  Name: Bonnie Lowe MRN: [id number] Date of Birth: 02-Dec-1970

## 2020-12-02 ENCOUNTER — Encounter: Payer: Self-pay | Admitting: Physical Therapy

## 2020-12-02 ENCOUNTER — Other Ambulatory Visit: Payer: Self-pay

## 2020-12-02 ENCOUNTER — Ambulatory Visit: Payer: BC Managed Care – PPO | Admitting: Physical Therapy

## 2020-12-02 DIAGNOSIS — R279 Unspecified lack of coordination: Secondary | ICD-10-CM

## 2020-12-02 DIAGNOSIS — R252 Cramp and spasm: Secondary | ICD-10-CM

## 2020-12-02 DIAGNOSIS — M6281 Muscle weakness (generalized): Secondary | ICD-10-CM

## 2020-12-02 NOTE — Therapy (Signed)
[facility] [address], [address] [place], [place], [address] Phone: [phone]   Fax:  [phone]  Physical Therapy Treatment  Patient Details  Name: Bonnie Lowe MRN: [id number] Date of Birth: 1970-09-26 Referring Provider (PT): Romie Levee, MD   Encounter Date: 12/02/2020   PT End of Session - 12/02/20 0801     Visit Number 15    Date for PT Re-Evaluation 12/13/20    Authorization Type bcbs    PT Start Time 0801    PT Stop Time 0840    PT Time Calculation (min) 39 min    Activity Tolerance Patient tolerated treatment well    Behavior During Therapy [facility] for tasks assessed/performed             Past Medical History:  Diagnosis Date   Angio-edema    Asthma    Hypertension     Past Surgical History:  Procedure Laterality Date   NO PAST SURGERIES      There were no vitals filed for this visit.   Subjective Assessment - 12/02/20 0804     Subjective Pt states things are better and less leakage.  Has not had any major episodes.  pt                               [facility] Adult PT Treatment/Exercise - 12/02/20 0001       Lumbar Exercises: Stretches   Other Lumbar Stretch Exercise double and single KTC on foam roll    Other Lumbar Stretch Exercise trunk rotation 5x      Manual Therapy   Soft tissue mobilization thoracic and lumbar paraspinals and fascial release, bilat gluteals              Trigger Point Dry Needling - 12/02/20 0001     Consent Given? Yes    Education Handout Provided Previously provided    Lumbar multifidi Response Twitch response elicited;Palpable increased muscle length    Thoracic multifidi response Twitch response elicited;Palpable increased muscle length                    PT Short Term Goals - 04/09/20 0943       PT SHORT TERM GOAL #1   Title ind with toileting techniques    Status Achieved      PT SHORT TERM GOAL #2   Title ind  with intial HEP    Status Achieved               PT Long Term Goals - 09/28/20 0907       PT LONG TERM GOAL #2   Title Pt will be able to do zumba without leakage    Status On-going      PT LONG TERM GOAL #3   Title Pt will report not having leakage or fecal smearing more than 1x/week    Status On-going      PT LONG TERM GOAL #4   Title Pt will not have any days with full incontinence of feces    Status On-going      PT LONG TERM GOAL #5   Title Pt will be able to have complete BM and no residual anal leakage of any amount at least 6/7 days per week    Status On-going                   Plan - 12/02/20 4818  Clinical Impression Statement Pt responded well to dry needling #3 today.  Pt got release from [facility] all the way up through the thoracic paraspinals.  Pt was educated on the importance of water and made a committment to 64oz/day for this entire week.  The low amount of fluid she is getting can be contributing to some of the muscle tension.  Continue skilled PT to address muscle length for improved muscle function throughout trunk and pelvic floor    Comorbidities hernia surgery, ovarian cyst surgery, chronic issue, other nerve related issues and muscle spasms in LE and glottis    PT Treatment/Interventions ADLs/Self Care Home Management;Biofeedback;Cryotherapy;Electrical Stimulation;Moist Heat;Therapeutic activities;Therapeutic exercise;Neuromuscular re-education;Patient/family education;Manual techniques;Passive range of motion;Dry needling;Taping    PT Next Visit Plan f/u on dry needling lumbar #3 and water, re-eval    PT Home Exercise Plan toileting ; Access Code: [email or web address]    Consulted and Agree with Plan of Care Patient             Patient will benefit from skilled therapeutic intervention in order to improve the following deficits and impairments:  Pain, Impaired sensation, Increased fascial restricitons, Decreased strength, Decreased skin integrity,  Decreased range of motion, Decreased coordination, Increased muscle spasms, Impaired flexibility, Impaired tone, Postural dysfunction  Visit Diagnosis: Unspecified lack of coordination  Muscle weakness (generalized)  Cramp and spasm     Problem List Patient Active Problem List   Diagnosis Date Noted   Moderate persistent asthma with acute exacerbation 10/27/2020   GERD (gastroesophageal reflux disease) 01/12/2020   Cough, persistent 11/24/2019   Seasonal and perennial allergic rhinoconjunctivitis 08/30/2018   Drug reaction 08/30/2018    Brayton Caves Bassem Bernasconi, PT 12/02/2020, 8:47 AM  [facility] [address] [place], [place], [address] Phone: [phone]   Fax:  [phone]  Name: Carlita Whitcomb MRN: [id number] Date of Birth: 10-08-1970

## 2020-12-07 NOTE — Progress Notes (Signed)
Follow Up Note  RE: Mishal Probert MRN: [id number] DOB: [birth year] Date of Office Visit: 12/08/2020  Referring provider: Evangeline Gula* Primary care provider: Hezzie Bump, FNP  Chief Complaint: Asthma  History of Present Illness: I had the pleasure of seeing Kalecia Hartney for a follow up visit at the [facility] on 12/08/2020. She is a 50 y.o. female, who is being followed for asthma, allergic rhino conjunctivitis, adverse drug reaction. Her previous allergy office visit was on 10/27/2020 with Dr. Selena Batten. Today is a regular follow up visit.  Moderate persistent asthma  ACT score 14.  Currently on Pulmicort 0.5mg  nebulizer once a day every other day for the past week. Stopped Symbicort after the last visit due to miscommunication.   Still having some coughing, wheezing and dyspnea on exertion. No additional prednisone or other ER/UC visits since the last visit.   Seasonal and perennial allergic rhinoconjunctivitis Restarted allergy injections. Currently on Singulair and OTC antihistamines once a day with good benefit. Taking ipratropium twice a day and other nasal sprays every 3 days.  Assessment and Plan: Jameka is a 50 y.o. female with: Moderate persistent asthma without complication Doing much better but due to miscommunication she stopped Symbicort.  ACT score 14. Today's spirometry was normal - improved from previous one.  Pulmicort 0.5mg  nebulizer twice a day for 1-2 weeks until your breathing symptoms return to baseline. Daily controller medication(s): START Symbicort 2 puffs twice a day with spacer and rinse mouth afterwards. Singulair 10mg  daily at night. During upper respiratory infections/asthma flares: ADD Pulmicort 0.5mg  nebulizer twice a day for 1-2 weeks until your breathing symptoms return to baseline.  May use albuterol rescue inhaler 2 puffs or nebulizer every 4 to 6 hours as needed for shortness of breath, chest  tightness, coughing, and wheezing. May use albuterol rescue inhaler 2 puffs 5 to 15 minutes prior to strenuous physical activities. Monitor frequency of use.  Get spirometry at next visit.  Seasonal and perennial allergic rhinoconjunctivitis Past history - Perennial rhino conjunctivitis symptoms for the past 40+ years but much improved since started AIT 10 years ago. 2021 skin testing positive to ragweed, weed, trees, mold, dust mites and dog. Interim history - still has some rhinitis symptoms. Continue environmental control measures as below. Continue allergy injections - if bloodwork is positive to dust mite and dog then will discuss with patient adding it back to her AIT. Use Atrovent (ipratropium) 0.03% 1-2 sprays per nostril twice a day as needed for runny nose/drainage.  Use dymista (fluticasone + azelastine nasal spray combination) 1 spray per nostril twice a day for nasal congestion. Nasal saline spray (i.e., Simply Saline) or nasal saline lavage (i.e., NeilMed) is recommended as needed and prior to medicated nasal sprays. Continue Singulair 10mg  daily. Use over the counter antihistamines such as Zyrtec (cetirizine), Claritin (loratadine), Allegra (fexofenadine), or Xyzal (levocetirizine) daily as needed. May take twice a day during allergy flares. May switch antihistamines every few months. Get bloodwork.  Adverse effect of other drugs, medicaments and biological substances, subsequent encounter Past history - Reactions to Avalox, sulfa and erythromycin in the past in the form of pruritus. Continue to avoid.   Return in about 2 months (around 02/07/2021).  No orders of the defined types were placed in this encounter.  Lab Orders  Allergens w/Total IgE Area 2  CBC with Differential/Platelet    Diagnostics: Spirometry:  Tracings reviewed. Her effort: Good reproducible efforts. FVC: 3.64L FEV1: 2.72L, 97% predicted  FEV1/FVC ratio: 75% Interpretation: Spirometry consistent with  normal pattern.  Please see scanned spirometry results for details.  Medication List:  Current Outpatient Medications  Medication Sig Dispense Refill   albuterol (PROVENTIL) (2.5 MG/3ML) 0.083% nebulizer solution Take 3 mLs (2.5 mg total) by nebulization every 4 (four) hours as needed for wheezing or shortness of breath (coughing fits). 75 mL 2   albuterol (VENTOLIN HFA) 108 (90 Base) MCG/ACT inhaler Inhale 2 puffs into the lungs every 4 (four) hours as needed. 18 g 1   Azelastine-Fluticasone 137-50 MCG/ACT SUSP USE 1 SPRAY INTO EACH NOSTRIL TWICE A DAY 23 g 2   Biotin 5000 MCG CAPS 1 capsule     budesonide (PULMICORT) 0.5 MG/2ML nebulizer solution Take 2 mLs (0.5 mg total) by nebulization in the morning and at bedtime. Use for 1-2 weeks at a time during upper respiratory infections. 60 mL 2   budesonide-formoterol (SYMBICORT) 160-4.5 MCG/ACT inhaler Inhale 2 puffs into the lungs 2 (two) times daily. (Patient taking differently: Inhale 2 puffs into the lungs as needed.) 10.2 g 5   cetirizine (ZYRTEC) 10 MG tablet Take 10 mg by mouth 2 (two) times daily.      EPINEPHrine (AUVI-Q) 0.3 mg/0.3 mL IJ SOAJ injection Inject 0.3 mLs (0.3 mg total) into the muscle as needed for anaphylaxis. 2 each 1   furosemide (LASIX) 40 MG tablet Take 40 mg by mouth daily.     ibuprofen (ADVIL) 800 MG tablet 2 tabs     ipratropium (ATROVENT) 0.03 % nasal spray 1-2 sprays each nostril 2-3 times daily as needed 30 mL 5   levothyroxine (SYNTHROID) 75 MCG tablet Take by mouth.     liothyronine (CYTOMEL) 5 MCG tablet Take 5 mcg by mouth daily.     losartan (COZAAR) 50 MG tablet Take 50 mg by mouth daily.     montelukast (SINGULAIR) 10 MG tablet Take 10 mg by mouth at bedtime.     NON FORMULARY 2 injections week     Semaglutide,0.25 or 0.5MG /DOS, 2 MG/1.5ML SOPN Inject into the skin.     spironolactone (ALDACTONE) 25 MG tablet Take 25 mg by mouth daily.     valACYclovir (VALTREX) 1000 MG tablet 1,000 mg as needed.      zolpidem (AMBIEN) 10 MG tablet Take 10 mg by mouth at bedtime as needed.     Current Facility-Administered Medications  Medication Dose Route Frequency Provider Last Rate Last Admin   predniSONE (DELTASONE) tablet 10 mg  10 mg Oral Q breakfast Bobbitt, Heywood Iles, MD       Allergies: Allergies  Allergen Reactions   Moxifloxacin Hcl Itching   Citrus Diarrhea   Erythromycin    Sulfa Antibiotics    I reviewed her past medical history, social history, family history, and environmental history and no significant changes have been reported from her previous visit.  Review of Systems  Constitutional:  Negative for appetite change, chills, fever and unexpected weight change.  HENT:  Positive for postnasal drip. Negative for congestion and rhinorrhea.   Eyes:  Negative for itching.  Respiratory:  Positive for cough, chest tightness, shortness of breath and wheezing.   Cardiovascular:  Negative for chest pain.  Gastrointestinal:  Negative for abdominal pain.  Genitourinary:  Negative for difficulty urinating.  Skin:  Negative for rash.  Allergic/Immunologic: Positive for environmental allergies. Negative for food allergies.  Neurological:  Negative for headaches.   Objective: BP 102/68 (BP Location: Right Arm, Patient Position: Sitting, Cuff Size: Normal)  Pulse 80   Temp 98 F (36.7 C) (Temporal)   Resp 16   Ht 5\' 5"  (1.651 m)   Wt 188 lb 3.2 oz (85.4 kg)   SpO2 100%   BMI 31.32 kg/m  Body mass index is 31.32 kg/m. Physical Exam Vitals and nursing note reviewed.  Constitutional:      Appearance: Normal appearance. She is well-developed.  HENT:     Head: Normocephalic and atraumatic.     Right Ear: Tympanic membrane and external ear normal.     Left Ear: Tympanic membrane and external ear normal.     Nose: Congestion and rhinorrhea present.     Mouth/Throat:     Mouth: Mucous membranes are moist.     Pharynx: Oropharynx is clear.  Eyes:     Conjunctiva/sclera:  Conjunctivae normal.  Cardiovascular:     Rate and Rhythm: Normal rate and regular rhythm.     Heart sounds: Normal heart sounds. No murmur heard.   No friction rub. No gallop.  Pulmonary:     Effort: Pulmonary effort is normal.     Breath sounds: Normal breath sounds. No wheezing or rales.  Musculoskeletal:     Cervical back: Neck supple.  Skin:    General: Skin is warm.     Findings: No rash.  Neurological:     Mental Status: She is alert and oriented to person, place, and time.  Psychiatric:        Behavior: Behavior normal.  Previous notes and tests were reviewed. The plan was reviewed with the patient/family, and all questions/concerned were addressed.  It was my pleasure to see Kosisochukwu today and participate in her care. Please feel free to contact me with any questions or concerns.  Sincerely,  Clydie Braun, DO Allergy & Immunology  [facility] office: [phone] [facility] office: [phone]

## 2020-12-08 ENCOUNTER — Encounter: Payer: Self-pay | Admitting: Allergy

## 2020-12-08 ENCOUNTER — Other Ambulatory Visit: Payer: Self-pay

## 2020-12-08 ENCOUNTER — Ambulatory Visit (INDEPENDENT_AMBULATORY_CARE_PROVIDER_SITE_OTHER): Payer: BC Managed Care – PPO | Admitting: Allergy

## 2020-12-08 VITALS — BP 102/68 | HR 80 | Temp 98.0°F | Resp 16 | Ht 65.0 in | Wt 188.2 lb

## 2020-12-08 DIAGNOSIS — J309 Allergic rhinitis, unspecified: Secondary | ICD-10-CM

## 2020-12-08 DIAGNOSIS — J3089 Other allergic rhinitis: Secondary | ICD-10-CM | POA: Diagnosis not present

## 2020-12-08 DIAGNOSIS — J4540 Moderate persistent asthma, uncomplicated: Secondary | ICD-10-CM

## 2020-12-08 DIAGNOSIS — J302 Other seasonal allergic rhinitis: Secondary | ICD-10-CM | POA: Diagnosis not present

## 2020-12-08 DIAGNOSIS — T50995D Adverse effect of other drugs, medicaments and biological substances, subsequent encounter: Secondary | ICD-10-CM

## 2020-12-08 NOTE — Assessment & Plan Note (Signed)
Doing much better but due to miscommunication she stopped Symbicort.   ACT score 14.  Today's spirometry was normal - improved from previous one.   Pulmicort 0.5mg  nebulizer twice a day for 1-2 weeks until your breathing symptoms return to baseline.  Daily controller medication(s): START Symbicort 2 puffs twice a day with spacer and rinse mouth afterwards.  Singulair 10mg  daily at night. . During upper respiratory infections/asthma flares: ADD Pulmicort 0.5mg  nebulizer twice a day for 1-2 weeks until your breathing symptoms return to baseline.  . May use albuterol rescue inhaler 2 puffs or nebulizer every 4 to 6 hours as needed for shortness of breath, chest tightness, coughing, and wheezing. May use albuterol rescue inhaler 2 puffs 5 to 15 minutes prior to strenuous physical activities. Monitor frequency of use.   Get spirometry at next visit.

## 2020-12-08 NOTE — Assessment & Plan Note (Signed)
Past history - Perennial rhino conjunctivitis symptoms for the past 40+ years but much improved since started AIT 10 years ago. 2021 skin testing positive to ragweed, weed, trees, mold, dust mites and dog. Interim history - still has some rhinitis symptoms.  Continue environmental control measures as below.  Continue allergy injections - if bloodwork is positive to dust mite and dog then will discuss with patient adding it back to her AIT.  Use Atrovent (ipratropium) 0.03% 1-2 sprays per nostril twice a day as needed for runny nose/drainage.   Use dymista (fluticasone + azelastine nasal spray combination) 1 spray per nostril twice a day for nasal congestion.  Nasal saline spray (i.e., Simply Saline) or nasal saline lavage (i.e., NeilMed) is recommended as needed and prior to medicated nasal sprays.  Continue Singulair 10mg  daily.  Use over the counter antihistamines such as Zyrtec (cetirizine), Claritin (loratadine), Allegra (fexofenadine), or Xyzal (levocetirizine) daily as needed. May take twice a day during allergy flares. May switch antihistamines every few months.  Get bloodwork.

## 2020-12-08 NOTE — Assessment & Plan Note (Signed)
Past history - Reactions to Avalox, sulfa and erythromycin in the past in the form of pruritus.  Continue to avoid.

## 2020-12-08 NOTE — Patient Instructions (Addendum)
Asthma  Pulmicort 0.5mg  nebulizer twice a day for 1-2 weeks until your breathing symptoms return to baseline. Daily controller medication(s): START Symbicort 2 puffs twice a day with spacer and rinse mouth afterwards. Singulair 10mg  daily at night. During upper respiratory infections/asthma flares: ADD Pulmicort 0.5mg  nebulizer twice a day for 1-2 weeks until your breathing symptoms return to baseline.  May use albuterol rescue inhaler 2 puffs or nebulizer every 4 to 6 hours as needed for shortness of breath, chest tightness, coughing, and wheezing. May use albuterol rescue inhaler 2 puffs 5 to 15 minutes prior to strenuous physical activities. Monitor frequency of use.  Asthma control goals:  Full participation in all desired activities (may need albuterol before activity) Albuterol use two times or less a week on average (not counting use with activity) Cough interfering with sleep two times or less a month Oral steroids no more than once a year No hospitalizations   Seasonal and perennial allergic rhinoconjunctivitis 2021 skin testing positive to ragweed, weed, trees, mold, dust mites and dog. Continue environmental control measures as below. Continue allergy injections.  Use Atrovent (ipratropium) 0.03% 1-2 sprays per nostril twice a day as needed for runny nose/drainage.  Use dymista (fluticasone + azelastine nasal spray combination) 1 spray per nostril twice a day for nasal congestion. Nasal saline spray (i.e., Simply Saline) or nasal saline lavage (i.e., NeilMed) is recommended as needed and prior to medicated nasal sprays. Continue Singulair 10mg  daily.  Use over the counter antihistamines such as Zyrtec (cetirizine), Claritin (loratadine), Allegra (fexofenadine), or Xyzal (levocetirizine) daily as needed. May take twice a day during allergy flares. May switch antihistamines every few months. Get bloodwork:  We are ordering labs, so please allow 1-2 weeks for the results to come  back. With the newly implemented Cures Act, the labs might be visible to you at the same time that they become visible to me. However, I will not address the results until all of the results are back, so please be patient.    Drug reaction Reactions to Avalox, sulfa and erythromycin in the past in the form of pruritus. Continue to avoid above medications for now.   Follow up in 2-3 months or sooner if needed.   After August 2022, I will not be in the [facility] office on a regular basis.  You can continue your care and follow up with Dr. 07-25-2003 or the nurse practitioners [facility] or Maurine Minister) in [facility]. OR you can follow up with me in our [place] ([address]) or [facility] [phone]) location.  Sincerely,  [employer], DO  [facility] office: [phone] [facility] office: [phone] [place] office: [phone]   Reducing Pollen Exposure Pollen seasons: trees (spring), grass (summer) and ragweed/weeds (fall). Keep windows closed in your home and car to lower pollen exposure.  Install air conditioning in the bedroom and throughout the house if possible.  Avoid going out in dry windy days - especially early morning. Pollen counts are highest between 5 - 10 AM and on dry, hot and windy days.  Save outside activities for late afternoon or after a heavy rain, when pollen levels are lower.  Avoid mowing of grass if you have grass pollen allergy. Be aware that pollen can also be transported indoors on people and pets.  Dry your clothes in an automatic dryer rather than hanging them outside where they might collect pollen.  Rinse hair and  eyes before bedtime. Control of House Dust Mite Allergen Dust mite allergens are a common trigger of allergy and asthma symptoms. While they can be found throughout the house, these microscopic creatures thrive in warm, humid environments such as bedding, upholstered furniture  and carpeting. Because so much time is spent in the bedroom, it is essential to reduce mite levels there.  Encase pillows, mattresses, and box springs in special allergen-proof fabric covers or airtight, zippered plastic covers.  Bedding should be washed weekly in hot water (130 F) and dried in a hot dryer. Allergen-proof covers are available for comforters and pillows that can't be regularly washed.  Wash the allergy-proof covers every few months. Minimize clutter in the bedroom. Keep pets out of the bedroom.  Keep humidity less than 50% by using a dehumidifier or air conditioning. You can buy a humidity measuring device called a hygrometer to monitor this.  If possible, replace carpets with hardwood, linoleum, or washable area rugs. If that's not possible, vacuum frequently with a vacuum that has a HEPA filter. Remove all upholstered furniture and non-washable window drapes from the bedroom. Remove all non-washable stuffed toys from the bedroom.  Wash stuffed toys weekly. Pet Allergen Avoidance: Contrary to popular opinion, there are no "hypoallergenic" breeds of dogs or cats. That is because people are not allergic to an animal's hair, but to an allergen found in the animal's saliva, dander (dead skin flakes) or urine. Pet allergy symptoms typically occur within minutes. For some people, symptoms can build up and become most severe 8 to 12 hours after contact with the animal. People with severe allergies can experience reactions in public places if dander has been transported on the pet owners' clothing. Keeping an animal outdoors is only a partial solution, since homes with pets in the yard still have higher concentrations of animal allergens. Before getting a pet, ask your allergist to determine if you are allergic to animals. If your pet is already considered part of your family, try to minimize contact and keep the pet out of the bedroom and other rooms where you spend a great deal of time. As  with dust mites, vacuum carpets often or replace carpet with a hardwood floor, tile or linoleum. High-efficiency particulate air (HEPA) cleaners can reduce allergen levels over time. While dander and saliva are the source of cat and dog allergens, urine is the source of allergens from rabbits, hamsters, mice and [place] pigs; so ask a non-allergic family member to clean the animal's cage. If you have a pet allergy, talk to your allergist about the potential for allergy immunotherapy (allergy shots). This strategy can often provide long-term relief. Mold Control Mold and fungi can grow on a variety of surfaces provided certain temperature and moisture conditions exist.  Outdoor molds grow on plants, decaying vegetation and soil. The major outdoor mold, Alternaria and Cladosporium, are found in very high numbers during hot and dry conditions. Generally, a late summer - fall peak is seen for common outdoor fungal spores. Rain will temporarily lower outdoor mold spore count, but counts rise rapidly when the rainy period ends. The most important indoor molds are Aspergillus and Penicillium. Dark, humid and poorly ventilated basements are ideal sites for mold growth. The next most common sites of mold growth are the bathroom and the kitchen. Outdoor (Seasonal) Mold Control Use air conditioning and keep windows closed. Avoid exposure to decaying vegetation. Avoid leaf raking. Avoid grain handling. Consider wearing a face mask if working in moldy areas.  Indoor (Perennial) Mold Control  Maintain humidity below 50%. Get rid of mold growth on hard surfaces with water, detergent and, if necessary, 5% bleach (do not mix with other cleaners). Then dry the area completely. If mold covers an area more than 10 square feet, consider hiring an indoor environmental professional. For clothing, washing with soap and water is best. If moldy items cannot be cleaned and dried, throw them away. Remove sources e.g.  contaminated carpets. Repair and seal leaking roofs or pipes. Using dehumidifiers in damp basements may be helpful, but empty the water and clean units regularly to prevent mildew from forming. All rooms, especially basements, bathrooms and kitchens, require ventilation and cleaning to deter mold and mildew growth. Avoid carpeting on concrete or damp floors, and storing items in damp areas.

## 2020-12-09 ENCOUNTER — Ambulatory Visit: Payer: BC Managed Care – PPO | Admitting: Physical Therapy

## 2020-12-09 DIAGNOSIS — R279 Unspecified lack of coordination: Secondary | ICD-10-CM | POA: Diagnosis not present

## 2020-12-09 DIAGNOSIS — M6281 Muscle weakness (generalized): Secondary | ICD-10-CM

## 2020-12-09 DIAGNOSIS — R252 Cramp and spasm: Secondary | ICD-10-CM

## 2020-12-09 NOTE — Therapy (Signed)
[facility] [address] [place], [place], [address] Phone: [phone]   Fax:  [phone]  Physical Therapy Treatment  Patient Details  Name: Bonnie Lowe MRN: [id number] Date of Birth: December 05, 1970 Referring Provider (PT): Leighton Ruff, MD   Encounter Date: 12/09/2020   PT End of Session - 12/09/20 0843     Visit Number 16    Date for PT Re-Evaluation 02/03/21    Authorization Type bcbs    PT Start Time 0804    PT Stop Time 0845    PT Time Calculation (min) 41 min    Activity Tolerance Patient tolerated treatment well    Behavior During Therapy [facility] for tasks assessed/performed             Past Medical History:  Diagnosis Date   Angio-edema    Asthma    Hypertension     Past Surgical History:  Procedure Laterality Date   NO PAST SURGERIES      There were no vitals filed for this visit.       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 Yes    Education Handout Provided Previously provided    Lumbar multifidi Response Twitch response elicited;Palpable increased muscle length    Thoracic multifidi response Twitch response elicited;Palpable increased muscle length                    PT Short Term Goals -  04/09/20 0943       PT SHORT TERM GOAL #1   Title ind with toileting techniques    Status Achieved      PT SHORT TERM GOAL #2   Title ind with intial HEP    Status Achieved               PT Long Term Goals - 12/09/20 4765       PT LONG TERM GOAL #1   Title pt will be ind with advanced HEP    Time 8    Period Weeks    Status On-going    Target Date 02/03/21      PT LONG TERM GOAL #2   Title Pt will be able to do zumba without leakage    Baseline pt is not doing due to when she had COVID and her lungs are not feeling normal still    Status Deferred      PT LONG TERM GOAL #3   Title Pt will report not having leakage or fecal smearing more than 1x/week    Baseline I  have not had any actual fecal leakage just a smear on the toilet paper when I pee, happens 90% of the time    Time 8    Period Weeks    Status On-going    Target Date 02/03/21      PT LONG TERM GOAL #4   Title Pt will not have any days with full incontinence of feces    Baseline has not had any the last 2 weeks    Status Partially Met    Target Date 02/03/21      PT LONG TERM GOAL #5   Title Pt will be able to have complete BM and no residual anal leakage of any amount at least 6/7 days per week    Baseline it feels complete and have BM 5/7 days and it is one time, still having small amount of fecal matter on toilet paper after wiping    Time 8    Period Weeks    Status On-going    Target Date 02/03/21                   Plan - 12/09/20 0859     Clinical Impression Statement Pt has been responding very well to dry needling.  She had a good muscle twitch on Rt thoracic erectors followed by increased soft tissue length.  Pt was re-assessed today and has4/5 MMT for hip abduction bilat, 5/5 for everything else.  Pt continue to have tension in hamstrings, gluteals and lumbar paraspinals.  Pt continues to benefit from skilled PT and is recommended to continue as she is expected to be able to begin  strengthening now that the muscle spasms have been more under control    PT Treatment/Interventions ADLs/Self Care Home Management;Biofeedback;Cryotherapy;Electrical Stimulation;Moist Heat;Therapeutic activities;Therapeutic exercise;Neuromuscular re-education;Patient/family education;Manual techniques;Passive range of motion;Dry needling;Taping    PT Next Visit Plan f/u on dry needling lumbar #4 and water, add some core exercises back into routine; glute med    PT Home Exercise Plan toileting ; Access Code: [email or web address]    Consulted and Agree with Plan of Care Patient             Patient will benefit from skilled therapeutic intervention in order to improve the following deficits and impairments:  Pain, Impaired sensation, Increased fascial restricitons, Decreased strength, Decreased skin integrity, Decreased range of motion, Decreased coordination, Increased muscle spasms, Impaired flexibility, Impaired tone, Postural dysfunction  Visit Diagnosis: Unspecified lack of coordination - Plan: PT plan of care cert/re-cert  Muscle weakness (generalized) - Plan: PT plan of care cert/re-cert  Cramp and spasm - Plan: PT plan of care cert/re-cert     Problem List Patient Active Problem List   Diagnosis Date Noted   Moderate persistent asthma without complication 14/48/1856   GERD (gastroesophageal reflux disease) 01/12/2020   Cough, persistent 11/24/2019   Seasonal and perennial allergic rhinoconjunctivitis 08/30/2018   Adverse effect of other drugs, medicaments and biological substances, subsequent encounter 08/30/2018    Jule Ser, PT 12/09/2020, 9:13 AM  [facility] [address] [place], [place], [address] Phone: [phone]   Fax:  [phone]  Name: Elfreda Blanchet MRN: [id number] Date of Birth: 1970/12/19

## 2020-12-16 ENCOUNTER — Ambulatory Visit (INDEPENDENT_AMBULATORY_CARE_PROVIDER_SITE_OTHER): Payer: BC Managed Care – PPO

## 2020-12-16 DIAGNOSIS — J309 Allergic rhinitis, unspecified: Secondary | ICD-10-CM

## 2020-12-17 ENCOUNTER — Ambulatory Visit: Payer: BC Managed Care – PPO | Admitting: Physical Therapy

## 2020-12-20 ENCOUNTER — Ambulatory Visit: Payer: BC Managed Care – PPO | Admitting: Physical Therapy

## 2020-12-20 ENCOUNTER — Encounter: Payer: Self-pay | Admitting: Allergy

## 2020-12-20 LAB — ALLERGENS W/TOTAL IGE AREA 2
Alternaria Alternata IgE: <0.1 kU/L
Aspergillus Fumigatus IgE: <0.1 kU/L
Bermuda Grass IgE: <0.1 kU/L
Cat Dander IgE: <0.1 kU/L
Cedar, Mountain IgE: <0.1 kU/L
Cladosporium Herbarum IgE: <0.1 kU/L
Cockroach, German IgE: <0.1 kU/L
Common Silver Birch IgE: <0.1 kU/L
Cottonwood IgE: <0.1 kU/L
D Farinae IgE: 0.28 kU/L — AB
D Pteronyssinus IgE: 0.14 kU/L — AB
Dog Dander IgE: <0.1 kU/L
Elm, American IgE: <0.1 kU/L
IgE (Immunoglobulin E), Serum: 150 IU/mL (ref 6–495)
Johnson Grass IgE: <0.1 kU/L
Maple/Box Elder IgE: <0.1 kU/L
Mouse Urine IgE: <0.1 kU/L
Oak, White IgE: <0.1 kU/L
Pecan, Hickory IgE: <0.1 kU/L
Penicillium Chrysogen IgE: <0.1 kU/L
Pigweed, Rough IgE: <0.1 kU/L
Ragweed, Short IgE: 0.1 kU/L — AB
Sheep Sorrel IgE Qn: 0.19 kU/L — AB
Timothy Grass IgE: <0.1 kU/L
White Mulberry IgE: <0.1 kU/L

## 2020-12-20 LAB — CBC WITH DIFFERENTIAL/PLATELET
Basophils Absolute: 0.1 10*3/uL (ref 0.0–0.2)
Basos: 1 %
EOS (ABSOLUTE): 0.1 10*3/uL (ref 0.0–0.4)
Eos: 2 %
Hematocrit: 39.8 % (ref 34.0–46.6)
Hemoglobin: 13 g/dL (ref 11.1–15.9)
Immature Grans (Abs): 0 10*3/uL (ref 0.0–0.1)
Immature Granulocytes: 0 %
Lymphocytes Absolute: 2.8 10*3/uL (ref 0.7–3.1)
Lymphs: 33 %
MCH: 27.8 pg (ref 26.6–33.0)
MCHC: 32.7 g/dL (ref 31.5–35.7)
MCV: 85 fL (ref 79–97)
Monocytes Absolute: 0.6 10*3/uL (ref 0.1–0.9)
Monocytes: 6 %
Neutrophils Absolute: 5 10*3/uL (ref 1.4–7.0)
Neutrophils: 58 %
Platelets: 348 10*3/uL (ref 150–450)
RBC: 4.68 x10E6/uL (ref 3.77–5.28)
RDW: 14.6 % (ref 11.7–15.4)
WBC: 8.5 10*3/uL (ref 3.4–10.8)

## 2020-12-22 ENCOUNTER — Ambulatory Visit (HOSPITAL_COMMUNITY)
Admission: RE | Admit: 2020-12-22 | Discharge: 2020-12-22 | Disposition: A | Discharge status: Home / Self Care | Payer: BC Managed Care – PPO | Attending: General Surgery | Admitting: General Surgery

## 2020-12-22 ENCOUNTER — Encounter (HOSPITAL_COMMUNITY)
Admission: RE | Disposition: A | Discharge status: Home / Self Care | Payer: Self-pay | Source: Home / Self Care | Attending: General Surgery

## 2020-12-22 ENCOUNTER — Encounter (HOSPITAL_COMMUNITY): Payer: Self-pay | Admitting: General Surgery

## 2020-12-22 ENCOUNTER — Ambulatory Visit (INDEPENDENT_AMBULATORY_CARE_PROVIDER_SITE_OTHER): Payer: BC Managed Care – PPO

## 2020-12-22 DIAGNOSIS — J309 Allergic rhinitis, unspecified: Secondary | ICD-10-CM | POA: Diagnosis not present

## 2020-12-22 DIAGNOSIS — R159 Full incontinence of feces: Secondary | ICD-10-CM | POA: Diagnosis present

## 2020-12-22 HISTORY — PX: ANAL RECTAL MANOMETRY: SHX6358

## 2020-12-22 SURGERY — MANOMETRY, ANORECTAL

## 2020-12-22 NOTE — Progress Notes (Addendum)
Anal manometry done per protocol. Pt tolerated well without distress or complication   Leanne Lovely  endo tech.in room during anal manometry .

## 2020-12-22 NOTE — Progress Notes (Signed)
CORRECTED VIAL LABEL.

## 2020-12-27 ENCOUNTER — Other Ambulatory Visit: Payer: Self-pay

## 2020-12-27 ENCOUNTER — Ambulatory Visit: Payer: BC Managed Care – PPO | Attending: General Surgery | Admitting: Physical Therapy

## 2020-12-27 DIAGNOSIS — R279 Unspecified lack of coordination: Secondary | ICD-10-CM | POA: Diagnosis present

## 2020-12-27 DIAGNOSIS — M6281 Muscle weakness (generalized): Secondary | ICD-10-CM

## 2020-12-27 DIAGNOSIS — R252 Cramp and spasm: Secondary | ICD-10-CM

## 2020-12-27 NOTE — Therapy (Signed)
[facility] [address] [place], [place], [address] Phone: [phone]   Fax:  [phone]  Physical Therapy Treatment  Patient Details  Name: Bonnie Lowe MRN: [id number] Date of Birth: 09-18-1970 Referring Provider (PT): Leighton Ruff, MD   Encounter Date: 12/27/2020   PT End of Session - 12/27/20 0817     Visit Number 17    Date for PT Re-Evaluation 02/03/21    Authorization Type bcbs    PT Start Time 0803    PT Stop Time 0843    PT Time Calculation (min) 40 min    Activity Tolerance Patient tolerated treatment well    Behavior During Therapy [facility] for tasks assessed/performed             Past Medical History:  Diagnosis Date   Angio-edema    Asthma    Hypertension     Past Surgical History:  Procedure Laterality Date   ANAL RECTAL MANOMETRY N/A 12/22/2020   Procedure: ANO RECTAL MANOMETRY;  Surgeon: Leighton Ruff, MD;  Location: [facility];  Service: Endoscopy;  Laterality: N/A;   NO PAST SURGERIES      There were no vitals filed for this visit.   Subjective Assessment - 12/27/20 0923     Subjective Pt states only 1 bad day.  Maybe just 1/week. Only on bad days will there be a lot of fecal smearing on the underwear.  Overall, feels better since last time.    Patient Stated Goals able to stop having fecal leakage, not have surgery    Currently in Pain? No/denies                               [facility] Adult PT Treatment/Exercise - 12/27/20 0001       Lumbar Exercises: Standing   Functional Squats 20 reps      Lumbar Exercises: Supine   Bridge 20 reps   red loop     Lumbar Exercises: Sidelying   Clam 10 reps;Right;Left   red loop     Manual Therapy   Soft tissue mobilization thoracic and lumbar paraspinals and fascial release, bilat gluteals, cupping thought low  to mid back              Trigger Point Dry Needling - 12/27/20 0001     Consent Given? Yes     Education Handout Provided Previously provided    Lumbar multifidi Response Twitch response elicited;Palpable increased muscle length    Thoracic multifidi response Twitch response elicited;Palpable increased muscle length                    PT Short Term Goals - 04/09/20 0943       PT SHORT TERM GOAL #1   Title ind with toileting techniques    Status Achieved      PT SHORT TERM GOAL #2   Title ind with intial HEP    Status Achieved               PT Long Term Goals - 12/09/20 0806       PT LONG TERM GOAL #1   Title pt will be ind with advanced HEP    Time 8    Period Weeks    Status On-going    Target Date 02/03/21      PT LONG TERM GOAL #2   Title Pt will be able to do zumba  without leakage    Baseline pt is not doing due to when she had COVID and her lungs are not feeling normal still    Status Deferred      PT LONG TERM GOAL #3   Title Pt will report not having leakage or fecal smearing more than 1x/week    Baseline I have not had any actual fecal leakage just a smear on the toilet paper when I pee, happens 90% of the time    Time 8    Period Weeks    Status On-going    Target Date 02/03/21      PT LONG TERM GOAL #4   Title Pt will not have any days with full incontinence of feces    Baseline has not had any the last 2 weeks    Status Partially Met    Target Date 02/03/21      PT LONG TERM GOAL #5   Title Pt will be able to have complete BM and no residual anal leakage of any amount at least 6/7 days per week    Baseline it feels complete and have BM 5/7 days and it is one time, still having small amount of fecal matter on toilet paper after wiping    Time 8    Period Weeks    Status On-going    Target Date 02/03/21                   Plan - 12/27/20 0917     Clinical Impression Statement Pt continues to respond well to dry needling.  She was able to add several exercises for strengthening today.  Pt has been making steady progress  towards her long term goals and will benefit from skilled PT to continue to work on strength and coordination.    PT Treatment/Interventions ADLs/Self Care Home Management;Biofeedback;Cryotherapy;Electrical Stimulation;Moist Heat;Therapeutic activities;Therapeutic exercise;Neuromuscular re-education;Patient/family education;Manual techniques;Passive range of motion;Dry needling;Taping    PT Next Visit Plan f/u on dry needling lumbar #5, HEP and water, continue to add some core exercises back into routine; glute med    PT Home Exercise Plan toileting ; Access Code: [email or web address]    Consulted and Agree with Plan of Care Patient             Patient will benefit from skilled therapeutic intervention in order to improve the following deficits and impairments:  Pain, Impaired sensation, Increased fascial restricitons, Decreased strength, Decreased skin integrity, Decreased range of motion, Decreased coordination, Increased muscle spasms, Impaired flexibility, Impaired tone, Postural dysfunction  Visit Diagnosis: Unspecified lack of coordination  Muscle weakness (generalized)  Cramp and spasm     Problem List Patient Active Problem List   Diagnosis Date Noted   Moderate persistent asthma without complication 37/34/2876   GERD (gastroesophageal reflux disease) 01/12/2020   Cough, persistent 11/24/2019   Seasonal and perennial allergic rhinoconjunctivitis 08/30/2018   Adverse effect of other drugs, medicaments and biological substances, subsequent encounter 08/30/2018    Jule Ser, PT 12/27/2020, 9:29 AM  [facility] [address], [place], [place], [address] Phone: [phone]   Fax:  [phone]  Name: Bonnie Lowe MRN: [id number] Date of Birth: 06/01/1971

## 2020-12-28 ENCOUNTER — Encounter: Payer: Self-pay | Admitting: Allergy

## 2020-12-28 MED ORDER — PREDNISONE 10 MG PO TABS: ORAL_TABLET | ORAL | 0 refills | Status: DC

## 2020-12-29 ENCOUNTER — Ambulatory Visit (INDEPENDENT_AMBULATORY_CARE_PROVIDER_SITE_OTHER): Payer: BC Managed Care – PPO

## 2020-12-29 DIAGNOSIS — J309 Allergic rhinitis, unspecified: Secondary | ICD-10-CM

## 2021-01-06 ENCOUNTER — Encounter: Payer: BC Managed Care – PPO | Admitting: Physical Therapy

## 2021-01-06 DIAGNOSIS — J3089 Other allergic rhinitis: Secondary | ICD-10-CM | POA: Diagnosis not present

## 2021-01-06 NOTE — Progress Notes (Signed)
VIALS MADE. EXP 01-06-22 

## 2021-01-14 ENCOUNTER — Ambulatory Visit (INDEPENDENT_AMBULATORY_CARE_PROVIDER_SITE_OTHER): Payer: BC Managed Care – PPO

## 2021-01-14 DIAGNOSIS — J309 Allergic rhinitis, unspecified: Secondary | ICD-10-CM

## 2021-01-18 ENCOUNTER — Ambulatory Visit (INDEPENDENT_AMBULATORY_CARE_PROVIDER_SITE_OTHER): Payer: BC Managed Care – PPO

## 2021-01-18 DIAGNOSIS — J309 Allergic rhinitis, unspecified: Secondary | ICD-10-CM

## 2021-01-27 ENCOUNTER — Encounter: Payer: Self-pay | Admitting: Physical Therapy

## 2021-01-27 ENCOUNTER — Other Ambulatory Visit: Payer: Self-pay

## 2021-01-27 ENCOUNTER — Ambulatory Visit (INDEPENDENT_AMBULATORY_CARE_PROVIDER_SITE_OTHER): Payer: BC Managed Care – PPO

## 2021-01-27 ENCOUNTER — Ambulatory Visit: Payer: BC Managed Care – PPO | Attending: General Surgery | Admitting: Physical Therapy

## 2021-01-27 DIAGNOSIS — J309 Allergic rhinitis, unspecified: Secondary | ICD-10-CM | POA: Diagnosis not present

## 2021-01-27 DIAGNOSIS — R279 Unspecified lack of coordination: Secondary | ICD-10-CM | POA: Insufficient documentation

## 2021-01-27 DIAGNOSIS — M6281 Muscle weakness (generalized): Secondary | ICD-10-CM

## 2021-01-27 DIAGNOSIS — R252 Cramp and spasm: Secondary | ICD-10-CM | POA: Insufficient documentation

## 2021-01-27 NOTE — Therapy (Signed)
[facility] [address], [place], [place], [address] Phone: [phone]   Fax:  [phone]  Physical Therapy Treatment  Patient Details  Name: Bonnie Lowe MRN: [id number] Date of Birth: 1971-01-26 Referring Provider (PT): Leighton Ruff, MD   Encounter Date: 01/27/2021   PT End of Session - 01/27/21 0801     Visit Number 18    Date for PT Re-Evaluation 02/03/21    Authorization Type bcbs    PT Start Time 0801    PT Stop Time 0841    PT Time Calculation (min) 40 min    Activity Tolerance Patient tolerated treatment well    Behavior During Therapy [facility] for tasks assessed/performed             Past Medical History:  Diagnosis Date   Angio-edema    Asthma    Hypertension     Past Surgical History:  Procedure Laterality Date   ANAL RECTAL MANOMETRY N/A 12/22/2020   Procedure: ANO RECTAL MANOMETRY;  Surgeon: Leighton Ruff, MD;  Location: [facility];  Service: Endoscopy;  Laterality: N/A;   NO PAST SURGERIES      There were no vitals filed for this visit.   Subjective Assessment - 01/27/21 0803     Subjective I have had some stress in my life and maybe that caused a couple of bad days but the fecal smearing is still there.  I have been exercises more regularly.    Currently in Pain? No/denies                               [facility] Adult PT Treatment/Exercise - 01/27/21 0001       Lumbar Exercises: Quadruped   Other Quadruped Lumbar Exercises TrA activation in qped and reaching different ways - educated and performed    Other Quadruped Lumbar Exercises half kneel diagonals - educated and performed      Manual Therapy   Soft tissue mobilization thoracic and lumbar paraspinals and fascial release, bilat gluteals              Trigger Point Dry Needling - 01/27/21 0001     Consent Given? Yes    Education Handout Provided Previously provided    Gluteus Medius Response  Twitch response elicited;Palpable increased muscle length    Lumbar multifidi Response Twitch response elicited;Palpable increased muscle length    Thoracic multifidi response Twitch response elicited;Palpable increased muscle length                  PT Education - 01/27/21 0840     Education Details Access Code: [email or web address]    Person(s) Educated Patient    Methods Explanation;Demonstration;Tactile cues;Verbal cues;Handout    Comprehension Verbalized understanding;Returned demonstration              PT Short Term Goals - 04/09/20 0943       PT SHORT TERM GOAL #1   Title ind with toileting techniques    Status Achieved      PT SHORT TERM GOAL #2   Title ind with intial HEP    Status Achieved               PT Long Term Goals - 12/09/20 0806       PT LONG TERM GOAL #1   Title pt will be ind with advanced HEP    Time 8    Period Weeks  Status On-going    Target Date 02/03/21      PT LONG TERM GOAL #2   Title Pt will be able to do zumba without leakage    Baseline pt is not doing due to when she had COVID and her lungs are not feeling normal still    Status Deferred      PT LONG TERM GOAL #3   Title Pt will report not having leakage or fecal smearing more than 1x/week    Baseline I have not had any actual fecal leakage just a smear on the toilet paper when I pee, happens 90% of the time    Time 8    Period Weeks    Status On-going    Target Date 02/03/21      PT LONG TERM GOAL #4   Title Pt will not have any days with full incontinence of feces    Baseline has not had any the last 2 weeks    Status Partially Met    Target Date 02/03/21      PT LONG TERM GOAL #5   Title Pt will be able to have complete BM and no residual anal leakage of any amount at least 6/7 days per week    Baseline it feels complete and have BM 5/7 days and it is one time, still having small amount of fecal matter on toilet paper after wiping    Time 8    Period Weeks     Status On-going    Target Date 02/03/21                   Plan - 01/27/21 0841     Clinical Impression Statement Pt is still doing well with decreased leakage overall.  She had a slight setback due to life event but she has done well with increased water and walking for exercise.  Pt is also drinking less soda and processed foods.  Pt was able to progress with functional core strengthening exercises today. Pt will benefit from one more visit to ensure good function and ind with HEP to continue to be able to progress on her own.    PT Treatment/Interventions ADLs/Self Care Home Management;Biofeedback;Cryotherapy;Electrical Stimulation;Moist Heat;Therapeutic activities;Therapeutic exercise;Neuromuscular re-education;Patient/family education;Manual techniques;Passive range of motion;Dry needling;Taping    PT Next Visit Plan goals and re-eval/d/c; f/u on dry needling lumbar #6, HEP finalized add some core exercises back into routine; glute med    PT Home Exercise Plan toileting ; Access Code: [email or web address]    Consulted and Agree with Plan of Care Patient             Patient will benefit from skilled therapeutic intervention in order to improve the following deficits and impairments:  Pain, Impaired sensation, Increased fascial restricitons, Decreased strength, Decreased skin integrity, Decreased range of motion, Decreased coordination, Increased muscle spasms, Impaired flexibility, Impaired tone, Postural dysfunction  Visit Diagnosis: Unspecified lack of coordination  Muscle weakness (generalized)  Cramp and spasm     Problem List Patient Active Problem List   Diagnosis Date Noted   Moderate persistent asthma without complication 47/82/9562   GERD (gastroesophageal reflux disease) 01/12/2020   Cough, persistent 11/24/2019   Seasonal and perennial allergic rhinoconjunctivitis 08/30/2018   Adverse effect of other drugs, medicaments and biological substances, subsequent encounter  08/30/2018    Jule Ser, PT 01/27/2021, 8:46 AM   [facility] [address] [place], [place], [address] Phone: [phone]   Fax:  [phone]  Name: Bonnie Lowe MRN: [id number] Date of Birth: May 03, 1971

## 2021-01-27 NOTE — Patient Instructions (Signed)
Access Code: [email or web address] URL: [email or web address] Date: 01/27/2021 Prepared by: Dwana Curd  Exercises Supine Figure 4 Piriformis Stretch - 1 x daily - 7 x weekly - 1 sets - 3 reps - 30 sec hold Supine Butterfly Groin Stretch - 1 x daily - 7 x weekly - 1 sets - 3 reps - 30 sec hold Supine Hamstring Stretch with Strap - 1 x daily - 7 x weekly - 1 sets - 3 reps - 30 sec hold Supine ITB Stretch with Strap - 1 x daily - 7 x weekly - 1 sets - 3 reps - 30 sec hold Supine Lower Trunk Rotation - 1 x daily - 7 x weekly - 1 sets - 10 reps - 5 sec hold Child's Pose with Sidebending - 1 x daily - 7 x weekly - 1 sets - 3 reps - 30 sec hold Seated Thoracic Flexion and Rotation with Arms Crossed - 1 x daily - 7 x weekly - 1 sets - 10 reps - 5 sec hold Supine Pelvic Floor Stretch - Hands on Knees - 1 x daily - 7 x weekly - 3 sets - 10 reps Bridge - 1 x daily - 7 x weekly - 3 sets - 10 reps Bridge with Hip Abduction and Resistance - 1 x daily - 7 x weekly - 3 sets - 10 reps Quadruped Exhale with Pelvic Floor Contraction - 1 x daily - 7 x weekly - 3 sets - 10 reps Quadruped Exhale with Pelvic Floor Contraction and Arm Raise - 1 x daily - 7 x weekly - 3 sets - 10 reps Side Stepping with Resistance at Thighs - 1 x daily - 7 x weekly - 3 sets - 10 reps Standing Lumbar Spine Flexion Stretch Counter - 5 x daily - 7 x weekly - 1 sets - 5 reps - 1 sec; rest 6 sec hold Seated Piriformis Stretch - 1 x daily - 7 x weekly - 3 reps - 1 sets - 30 sec hold Seated Hamstring Stretch - 1 x daily - 7 x weekly - 3 reps - 1 sets - 30 sec hold Half Kneeling Chop with Medicine Ball - 1 x daily - 7 x weekly - 3 sets - 10 reps Quadruped Alternating Arm Lift - 1 x daily - 7 x weekly - 10 reps - 2 sets  Patient Education Trigger Point Dry Needling

## 2021-02-03 ENCOUNTER — Encounter: Payer: Self-pay | Admitting: Physical Therapy

## 2021-02-03 ENCOUNTER — Other Ambulatory Visit: Payer: Self-pay

## 2021-02-03 ENCOUNTER — Ambulatory Visit (INDEPENDENT_AMBULATORY_CARE_PROVIDER_SITE_OTHER): Payer: BC Managed Care – PPO

## 2021-02-03 ENCOUNTER — Ambulatory Visit: Payer: BC Managed Care – PPO | Admitting: Physical Therapy

## 2021-02-03 DIAGNOSIS — J309 Allergic rhinitis, unspecified: Secondary | ICD-10-CM | POA: Diagnosis not present

## 2021-02-03 DIAGNOSIS — R252 Cramp and spasm: Secondary | ICD-10-CM

## 2021-02-03 DIAGNOSIS — M6281 Muscle weakness (generalized): Secondary | ICD-10-CM

## 2021-02-03 DIAGNOSIS — R279 Unspecified lack of coordination: Secondary | ICD-10-CM

## 2021-02-03 NOTE — Therapy (Addendum)
[facility] [address] [place], [place], [address] Phone: [phone]   Fax:  [phone]  Physical Therapy Treatment  Patient Details  Name: Bonnie Lowe MRN: [id number] Date of Birth: 26-Jan-1971 Referring Provider (PT): Leighton Ruff, MD   Encounter Date: 02/03/2021   PT End of Session - 02/03/21 0811     Visit Number 19    Date for PT Re-Evaluation 02/03/21    Authorization Type bcbs    PT Start Time 0801    PT Stop Time 0833    PT Time Calculation (min) 32 min    Activity Tolerance Patient tolerated treatment well    Behavior During Therapy [facility] for tasks assessed/performed             Past Medical History:  Diagnosis Date   Angio-edema    Asthma    Hypertension     Past Surgical History:  Procedure Laterality Date   ANAL RECTAL MANOMETRY N/A 12/22/2020   Procedure: ANO RECTAL MANOMETRY;  Surgeon: Leighton Ruff, MD;  Location: [facility];  Service: Endoscopy;  Laterality: N/A;   NO PAST SURGERIES      There were no vitals filed for this visit.   Subjective Assessment - 02/03/21 0924     Subjective Overall much better than when I started.  Pt updates as noted in goals section    Currently in Pain? No/denies                               [facility] Adult PT Treatment/Exercise - 02/03/21 0001       Manual Therapy   Soft tissue mobilization thoracic and lumbar paraspinals and fascial release, bilat gluteals              Trigger Point Dry Needling - 02/03/21 0001     Consent Given? Yes    Education Handout Provided Previously provided    Lumbar multifidi Response Twitch response elicited;Palpable increased muscle length    Thoracic multifidi response Twitch response elicited;Palpable increased muscle length                    PT Short Term Goals - 04/09/20 0943       PT SHORT TERM GOAL #1   Title ind with toileting techniques    Status Achieved       PT SHORT TERM GOAL #2   Title ind with intial HEP    Status Achieved               PT Long Term Goals - 02/03/21 7619       PT LONG TERM GOAL #1   Title pt will be ind with advanced HEP    Status Achieved      PT LONG TERM GOAL #2   Title Pt will be able to do zumba without leakage    Status Deferred      PT LONG TERM GOAL #3   Title Pt will report not having leakage or fecal smearing more than 1x/week    Baseline I have not had any actual fecal leakage just a smear on the toilet paper when I pee, happens 90% of the time    Status Partially Met      PT LONG TERM GOAL #4   Title Pt will not have any days with full incontinence of feces    Baseline occasionally due to stress    Status  Partially Met      PT LONG TERM GOAL #5   Title Pt will be able to have complete BM and no residual anal leakage of any amount at least 6/7 days per week    Baseline It has been 1/day week down from 3-4/week    Status Achieved                   Plan - 02/03/21 0839     Clinical Impression Statement Pt has done well with HEP.  Final session to get maximum benefit from [facility] and dry needling.  She has reached a plateau with PT and will D/c with HEP today.    PT Treatment/Interventions ADLs/Self Care Home Management;Biofeedback;Cryotherapy;Electrical Stimulation;Moist Heat;Therapeutic activities;Therapeutic exercise;Neuromuscular re-education;Patient/family education;Manual techniques;Passive range of motion;Dry needling;Taping    PT Next Visit Plan d/c today    PT Home Exercise Plan toileting ; Access Code: [email or web address]    Consulted and Agree with Plan of Care Patient             Patient will benefit from skilled therapeutic intervention in order to improve the following deficits and impairments:  Pain, Impaired sensation, Increased fascial restricitons, Decreased strength, Decreased skin integrity, Decreased range of motion, Decreased coordination, Increased muscle spasms,  Impaired flexibility, Impaired tone, Postural dysfunction  Visit Diagnosis: Unspecified lack of coordination  Muscle weakness (generalized)  Cramp and spasm     Problem List Patient Active Problem List   Diagnosis Date Noted   Moderate persistent asthma without complication 30/74/6002   GERD (gastroesophageal reflux disease) 01/12/2020   Cough, persistent 11/24/2019   Seasonal and perennial allergic rhinoconjunctivitis 08/30/2018   Adverse effect of other drugs, medicaments and biological substances, subsequent encounter 08/30/2018    Jule Ser, PT 02/03/2021, 9:26 AM  [facility] [address] [place], [place], [address] Phone: [phone]   Fax:  [phone]  Name: Bonnie Lowe MRN: [id number] Date of Birth: 1970-07-09   PHYSICAL THERAPY DISCHARGE SUMMARY  Visits from Start of Care: 19  Current functional level related to goals / functional outcomes: See above goals   Remaining deficits: See above   Education / Equipment: HEP  Patient agrees to discharge. Patient goals were partially met. Patient is being discharged due to being pleased with the current functional level.  Gustavus Bryant, PT 02/03/21 9:29 AM

## 2021-02-08 ENCOUNTER — Other Ambulatory Visit: Payer: Self-pay

## 2021-02-08 ENCOUNTER — Encounter: Payer: Self-pay | Admitting: Family Medicine

## 2021-02-08 ENCOUNTER — Ambulatory Visit (INDEPENDENT_AMBULATORY_CARE_PROVIDER_SITE_OTHER): Payer: BC Managed Care – PPO | Admitting: Family Medicine

## 2021-02-08 VITALS — BP 118/72 | HR 77 | Temp 97.8°F | Resp 16

## 2021-02-08 DIAGNOSIS — J302 Other seasonal allergic rhinitis: Secondary | ICD-10-CM

## 2021-02-08 DIAGNOSIS — H1013 Acute atopic conjunctivitis, bilateral: Secondary | ICD-10-CM

## 2021-02-08 DIAGNOSIS — J3089 Other allergic rhinitis: Secondary | ICD-10-CM

## 2021-02-08 DIAGNOSIS — J4540 Moderate persistent asthma, uncomplicated: Secondary | ICD-10-CM

## 2021-02-08 DIAGNOSIS — T50995D Adverse effect of other drugs, medicaments and biological substances, subsequent encounter: Secondary | ICD-10-CM

## 2021-02-08 DIAGNOSIS — J309 Allergic rhinitis, unspecified: Secondary | ICD-10-CM

## 2021-02-08 NOTE — Patient Instructions (Signed)
Asthma Continue montelukast 10 mg once a day to prevent cough or wheeze Continue albuterol 2 puffs once every 4 hours as needed for cough or wheeze You may use albuterol 2 puffs 5 to 15 minutes before activity to decrease cough or wheeze For asthma flare, begin Symbicort 160-2 puffs twice a day with a spacer for 2 weeks or until cough and wheeze free.  Please call the clinic if you need to start Symbicort Asthma control goals:  Full participation in all desired activities (may need albuterol before activity) Albuterol use two time or less a week on average (not counting use with activity) Cough interfering with sleep two time or less a month Oral steroids no more than once a year No hospitalizations  Allergic rhinitis Continue allergen avoidance measures directed toward tree pollen, weed pollen, mold, dust mite, and dog as listed below Continue cetirizine 10 mg once a day as needed for runny nose Continue Atrovent 2 sprays in each nostril twice a day as needed for runny nose Continue Dymista 2 sprays in each nostril twice a day as needed for nasal symptoms Consider saline nasal rinses as needed for nasal symptoms. Use this before any medicated nasal sprays for best result  Allergic conjunctivitis Some over the counter eye drops include Pataday one drop in each eye once a day as needed for red, itchy eyes OR Zaditor one drop in each eye twice a day as needed for red itchy eyes.  Drug reaction Continue to avoid Avelox, sulfa drugs, and erythromycin  Call the clinic if this treatment plan is not working well for you.  Follow up in 4 months or sooner if needed.  Reducing Pollen Exposure The American Academy of Allergy, Asthma and Immunology suggests the following steps to reduce your exposure to pollen during allergy seasons. Do not hang sheets or clothing out to dry; pollen may collect on these items. Do not mow lawns or spend time around freshly cut grass; mowing stirs up pollen. Keep  windows closed at night.  Keep car windows closed while driving. Minimize morning activities outdoors, a time when pollen counts are usually at their highest. Stay indoors as much as possible when pollen counts or humidity is high and on windy days when pollen tends to remain in the air longer. Use air conditioning when possible.  Many air conditioners have filters that trap the pollen spores. Use a HEPA room air filter to remove pollen form the indoor air you breathe.  Control of Mold Allergen Mold and fungi can grow on a variety of surfaces provided certain temperature and moisture conditions exist.  Outdoor molds grow on plants, decaying vegetation and soil.  The major outdoor mold, Alternaria and Cladosporium, are found in very high numbers during hot and dry conditions.  Generally, a late Summer - Fall peak is seen for common outdoor fungal spores.  Rain will temporarily lower outdoor mold spore count, but counts rise rapidly when the rainy period ends.  The most important indoor molds are Aspergillus and Penicillium.  Dark, humid and poorly ventilated basements are ideal sites for mold growth.  The next most common sites of mold growth are the bathroom and the kitchen.  Outdoor [employer] Use air conditioning and keep windows closed Avoid exposure to decaying vegetation. Avoid leaf raking. Avoid grain handling. Consider wearing a face mask if working in moldy areas.  Indoor Mold Control Maintain humidity below 50%. Clean washable surfaces with 5% bleach solution. Remove sources e.g. Contaminated carpets.  Control of Dust Mite Allergen Dust mites play a major role in allergic asthma and rhinitis. They occur in environments with high humidity wherever human skin is found. Dust mites absorb humidity from the atmosphere (ie, they do not drink) and feed on organic matter (including shed human and animal skin). Dust mites are a microscopic type of insect that you cannot see with the naked  eye. High levels of dust mites have been detected from mattresses, pillows, carpets, upholstered furniture, bed covers, clothes, soft toys and any woven material. The principal allergen of the dust mite is found in its feces. A gram of dust may contain 1,000 mites and 250,000 fecal particles. Mite antigen is easily measured in the air during house cleaning activities. Dust mites do not bite and do not cause harm to humans, other than by triggering allergies/asthma.  Ways to decrease your exposure to dust mites in your home:  1. Encase mattresses, box springs and pillows with a mite-impermeable barrier or cover  2. Wash sheets, blankets and drapes weekly in hot water (130 F) with detergent and dry them in a dryer on the hot setting.  3. Have the room cleaned frequently with a vacuum cleaner and a damp dust-mop. For carpeting or rugs, vacuuming with a vacuum cleaner equipped with a high-efficiency particulate air (HEPA) filter. The dust mite allergic individual should not be in a room which is being cleaned and should wait 1 hour after cleaning before going into the room.  4. Do not sleep on upholstered furniture (eg, couches).  5. If possible removing carpeting, upholstered furniture and drapery from the home is ideal. Horizontal blinds should be eliminated in the rooms where the person spends the most time (bedroom, study, television room). Washable vinyl, roller-type shades are optimal.  6. Remove all non-washable stuffed toys from the bedroom. Wash stuffed toys weekly like sheets and blankets above.  7. Reduce indoor humidity to less than 50%. Inexpensive humidity monitors can be purchased at most hardware stores. Do not use a humidifier as can make the problem worse and are not recommended.  Control of Dog or Cat Allergen Avoidance is the best way to manage a dog or cat allergy. If you have a dog or cat and are allergic to dog or cats, consider removing the dog or cat from the home. If you  have a dog or cat but don't want to find it a new home, or if your family wants a pet even though someone in the household is allergic, here are some strategies that may help keep symptoms at bay:  Keep the pet out of your bedroom and restrict it to only a few rooms. Be advised that keeping the dog or cat in only one room will not limit the allergens to that room. Don't pet, hug or kiss the dog or cat; if you do, wash your hands with soap and water. High-efficiency particulate air (HEPA) cleaners run continuously in a bedroom or living room can reduce allergen levels over time. Regular use of a high-efficiency vacuum cleaner or a central vacuum can reduce allergen levels. Giving your dog or cat a bath at least once a week can reduce airborne allergen.

## 2021-02-08 NOTE — Progress Notes (Signed)
[address] [place] [address] Dept: [phone]  FOLLOW UP NOTE  Patient ID: Bonnie Lowe, female    DOB: [birth year]  Age: 50 y.o. MRN: [id number] Date of Office Visit: 02/08/2021  Assessment  Chief Complaint: Allergic Rhinitis  (Doing well) and Asthma (Doing well)  HPI Bonnie Lowe is a 50 year old female who presents to the clinic for well visit.  She was last seen in this clinic on 12/08/2020 by Dr. Selena Batten for evaluation of asthma, allergic rhinitis, allergic conjunctivitis, and allergic reaction to multiple drugs.  At today's visit, she reports her asthma has been well controlled with no shortness of breath or wheeze with activity or rest.  She does report occasional intermittent dry cough that she believes is from postnasal drainage.  She continues montelukast 10 mg once a day and uses Symbicort 160 about once every 1 to 2 weeks.  She has not used albuterl via inhaler or nebulizer nor has she used Pulmicort via nebulizer since her last visit to this clinic.  She does report that she is walking in the morning for the last 6 weeks with no symptoms of asthma.  She did request prednisone on 12/28/2020 because she was going out of the country and wanted this for backup in case she experienced asthma symptoms while traveling, however, she did not need to use this.  Allergic rhinitis is reported as moderately well controlled with the main symptom of postnasal drainage occurring occasionally.  She continues cetirizine 10 mg once a day, Atrovent nasal spray daily, Dymista 2 to 3 days a week, and occasionally uses nasal saline rinses.  She continues allergen immunotherapy with no large or local reactions.  She reports a significant decrease in her symptoms of allergic rhinitis while continuing on allergen immunotherapy.  Allergic conjunctivitis is reported as well controlled with no current medical intervention.  She continues to avoid medications which she has had an allergic reaction to  including erythromycin, sulfa antibiotics, and Avelox.  Her current medications are listed in the chart.   Drug Allergies:  Allergies  Allergen Reactions   Moxifloxacin Hcl Itching   Citrus Diarrhea   Erythromycin    Sulfa Antibiotics     Physical Exam: BP 118/72   Pulse 77   Temp 97.8 F (36.6 C) (Temporal)   Resp 16   SpO2 98%    Physical Exam Vitals reviewed.  Constitutional:      Appearance: Normal appearance.  HENT:     Head: Normocephalic and atraumatic.     Right Ear: Tympanic membrane normal.     Left Ear: Tympanic membrane normal.     Nose:     Comments: Bilateral nares slightly erythematous with clear nasal drainage noted.  Pharynx normal.  Ears normal.  Eyes normal.    Mouth/Throat:     Pharynx: Oropharynx is clear.  Eyes:     Conjunctiva/sclera: Conjunctivae normal.  Cardiovascular:     Rate and Rhythm: Normal rate and regular rhythm.     Heart sounds: Normal heart sounds. No murmur heard. Pulmonary:     Effort: Pulmonary effort is normal.     Breath sounds: Normal breath sounds.     Comments: Lungs clear to auscultation Musculoskeletal:        General: Normal range of motion.     Cervical back: Normal range of motion and neck supple.  Skin:    General: Skin is warm and dry.  Neurological:     Mental Status: She is alert and oriented to  person, place, and time.  Psychiatric:        Mood and Affect: Mood normal.        Behavior: Behavior normal.        Thought Content: Thought content normal.        Judgment: Judgment normal.    Diagnostics: FVC 4.25, FEV1 3.15.  Predicted FVC 3.62, predicted FEV1 2.85.  Spirometry indicates normal ventilatory function.  Assessment and Plan: 1. Moderate persistent asthma without complication   2. Seasonal and perennial allergic rhinitis   3. Allergic conjunctivitis of both eyes   4. Adverse effect of other drugs, medicaments and biological substances, subsequent encounter      Patient Instructions   Asthma Continue montelukast 10 mg once a day to prevent cough or wheeze Continue albuterol 2 puffs once every 4 hours as needed for cough or wheeze You may use albuterol 2 puffs 5 to 15 minutes before activity to decrease cough or wheeze For asthma flare, begin Symbicort 160-2 puffs twice a day with a spacer for 2 weeks or until cough and wheeze free.  Please call the clinic if you need to start Symbicort Asthma control goals:  Full participation in all desired activities (may need albuterol before activity) Albuterol use two time or less a week on average (not counting use with activity) Cough interfering with sleep two time or less a month Oral steroids no more than once a year No hospitalizations  Allergic rhinitis Continue allergen avoidance measures directed toward tree pollen, weed pollen, mold, dust mite, and dog as listed below Continue cetirizine 10 mg once a day as needed for runny nose Continue Atrovent 2 sprays in each nostril twice a day as needed for runny nose Continue Dymista 2 sprays in each nostril twice a day as needed for nasal symptoms Consider saline nasal rinses as needed for nasal symptoms. Use this before any medicated nasal sprays for best result  Allergic conjunctivitis Some over the counter eye drops include Pataday one drop in each eye once a day as needed for red, itchy eyes OR Zaditor one drop in each eye twice a day as needed for red itchy eyes.  Drug reaction Continue to avoid Avelox, sulfa drugs, and erythromycin  Call the clinic if this treatment plan is not working well for you.  Follow up in 4 months or sooner if needed.   Return in about 4 months (around 06/10/2021), or if symptoms worsen or fail to improve.    Thank you for the opportunity to care for this patient.  Please do not hesitate to contact me with questions.  Thermon Leyland, FNP [facility]

## 2021-02-17 ENCOUNTER — Ambulatory Visit (INDEPENDENT_AMBULATORY_CARE_PROVIDER_SITE_OTHER): Payer: BC Managed Care – PPO

## 2021-02-17 DIAGNOSIS — J309 Allergic rhinitis, unspecified: Secondary | ICD-10-CM | POA: Diagnosis not present

## 2021-02-21 ENCOUNTER — Ambulatory Visit (INDEPENDENT_AMBULATORY_CARE_PROVIDER_SITE_OTHER): Payer: BC Managed Care – PPO

## 2021-02-21 DIAGNOSIS — J309 Allergic rhinitis, unspecified: Secondary | ICD-10-CM

## 2021-03-04 ENCOUNTER — Ambulatory Visit (INDEPENDENT_AMBULATORY_CARE_PROVIDER_SITE_OTHER): Payer: BC Managed Care – PPO

## 2021-03-04 DIAGNOSIS — J309 Allergic rhinitis, unspecified: Secondary | ICD-10-CM

## 2021-03-07 ENCOUNTER — Ambulatory Visit (INDEPENDENT_AMBULATORY_CARE_PROVIDER_SITE_OTHER): Payer: BC Managed Care – PPO

## 2021-03-07 DIAGNOSIS — J309 Allergic rhinitis, unspecified: Secondary | ICD-10-CM | POA: Diagnosis not present

## 2021-03-15 ENCOUNTER — Ambulatory Visit (INDEPENDENT_AMBULATORY_CARE_PROVIDER_SITE_OTHER): Payer: BC Managed Care – PPO

## 2021-03-15 DIAGNOSIS — J309 Allergic rhinitis, unspecified: Secondary | ICD-10-CM

## 2021-03-21 ENCOUNTER — Ambulatory Visit: Payer: Self-pay

## 2021-03-25 ENCOUNTER — Ambulatory Visit (INDEPENDENT_AMBULATORY_CARE_PROVIDER_SITE_OTHER): Payer: BC Managed Care – PPO

## 2021-03-25 DIAGNOSIS — J309 Allergic rhinitis, unspecified: Secondary | ICD-10-CM | POA: Diagnosis not present

## 2021-03-31 ENCOUNTER — Ambulatory Visit (INDEPENDENT_AMBULATORY_CARE_PROVIDER_SITE_OTHER): Payer: BC Managed Care – PPO

## 2021-03-31 DIAGNOSIS — J309 Allergic rhinitis, unspecified: Secondary | ICD-10-CM

## 2021-04-08 ENCOUNTER — Ambulatory Visit (INDEPENDENT_AMBULATORY_CARE_PROVIDER_SITE_OTHER): Payer: BC Managed Care – PPO

## 2021-04-08 DIAGNOSIS — J309 Allergic rhinitis, unspecified: Secondary | ICD-10-CM

## 2021-04-12 ENCOUNTER — Encounter (HOSPITAL_BASED_OUTPATIENT_CLINIC_OR_DEPARTMENT_OTHER): Payer: Self-pay

## 2021-04-12 ENCOUNTER — Emergency Department (HOSPITAL_BASED_OUTPATIENT_CLINIC_OR_DEPARTMENT_OTHER): Payer: BC Managed Care – PPO

## 2021-04-12 ENCOUNTER — Other Ambulatory Visit: Payer: Self-pay

## 2021-04-12 ENCOUNTER — Emergency Department (HOSPITAL_BASED_OUTPATIENT_CLINIC_OR_DEPARTMENT_OTHER)
Admission: EM | Admit: 2021-04-12 | Discharge: 2021-04-12 | Disposition: A | Discharge status: Home / Self Care | Payer: BC Managed Care – PPO | Attending: Emergency Medicine | Admitting: Emergency Medicine

## 2021-04-12 DIAGNOSIS — Z20822 Contact with and (suspected) exposure to covid-19: Secondary | ICD-10-CM | POA: Insufficient documentation

## 2021-04-12 DIAGNOSIS — Z79899 Other long term (current) drug therapy: Secondary | ICD-10-CM | POA: Diagnosis not present

## 2021-04-12 DIAGNOSIS — R000 Tachycardia, unspecified: Secondary | ICD-10-CM | POA: Insufficient documentation

## 2021-04-12 DIAGNOSIS — J45909 Unspecified asthma, uncomplicated: Secondary | ICD-10-CM | POA: Diagnosis not present

## 2021-04-12 DIAGNOSIS — K219 Gastro-esophageal reflux disease without esophagitis: Secondary | ICD-10-CM | POA: Diagnosis not present

## 2021-04-12 DIAGNOSIS — I10 Essential (primary) hypertension: Secondary | ICD-10-CM | POA: Diagnosis not present

## 2021-04-12 DIAGNOSIS — R109 Unspecified abdominal pain: Secondary | ICD-10-CM | POA: Diagnosis present

## 2021-04-12 DIAGNOSIS — N12 Tubulo-interstitial nephritis, not specified as acute or chronic: Secondary | ICD-10-CM | POA: Insufficient documentation

## 2021-04-12 LAB — CBC WITH DIFFERENTIAL/PLATELET
Abs Immature Granulocytes: 0.08 10*3/uL — ABNORMAL HIGH (ref 0.00–0.07)
Basophils Absolute: 0.1 10*3/uL (ref 0.0–0.1)
Basophils Relative: 0 %
Eosinophils Absolute: 0 10*3/uL (ref 0.0–0.5)
Eosinophils Relative: 0 %
HCT: 40.6 % (ref 36.0–46.0)
Hemoglobin: 13.3 g/dL (ref 12.0–15.0)
Immature Granulocytes: 1 %
Lymphocytes Relative: 9 %
Lymphs Abs: 1.5 10*3/uL (ref 0.7–4.0)
MCH: 28.5 pg (ref 26.0–34.0)
MCHC: 32.8 g/dL (ref 30.0–36.0)
MCV: 87.1 fL (ref 80.0–100.0)
Monocytes Absolute: 1.3 10*3/uL — ABNORMAL HIGH (ref 0.1–1.0)
Monocytes Relative: 8 %
Neutro Abs: 13.6 10*3/uL — ABNORMAL HIGH (ref 1.7–7.7)
Neutrophils Relative %: 82 %
Platelets: 269 10*3/uL (ref 150–400)
RBC: 4.66 MIL/uL (ref 3.87–5.11)
RDW: 14.6 % (ref 11.5–15.5)
WBC: 16.6 10*3/uL — ABNORMAL HIGH (ref 4.0–10.5)
nRBC: 0 % (ref 0.0–0.2)

## 2021-04-12 LAB — COMPREHENSIVE METABOLIC PANEL
ALT: 13 U/L (ref 0–44)
AST: 15 U/L (ref 15–41)
Albumin: 4 g/dL (ref 3.5–5.0)
Alkaline Phosphatase: 57 U/L (ref 38–126)
Anion gap: 12 (ref 5–15)
BUN: 12 mg/dL (ref 6–20)
CO2: 20 mmol/L — ABNORMAL LOW (ref 22–32)
Calcium: 9.6 mg/dL (ref 8.9–10.3)
Chloride: 102 mmol/L (ref 98–111)
Creatinine, Ser: 0.83 mg/dL (ref 0.44–1.00)
GFR, Estimated: >60 mL/min (ref >60)
Glucose, Bld: 112 mg/dL — ABNORMAL HIGH (ref 70–99)
Potassium: 3.8 mmol/L (ref 3.5–5.1)
Sodium: 134 mmol/L — ABNORMAL LOW (ref 135–145)
Total Bilirubin: 1 mg/dL (ref 0.3–1.2)
Total Protein: 7.6 g/dL (ref 6.5–8.1)

## 2021-04-12 LAB — URINALYSIS, ROUTINE W REFLEX MICROSCOPIC
Glucose, UA: NEGATIVE mg/dL
Ketones, ur: >=80 mg/dL — AB
Nitrite: NEGATIVE
Protein, ur: 100 mg/dL — AB
Specific Gravity, Urine: 1.02 (ref 1.005–1.030)
pH: 6 (ref 5.0–8.0)

## 2021-04-12 LAB — LACTIC ACID, PLASMA: Lactic Acid, Venous: 0.9 mmol/L (ref 0.5–1.9)

## 2021-04-12 LAB — URINALYSIS, MICROSCOPIC (REFLEX)

## 2021-04-12 LAB — LIPASE, BLOOD: Lipase: 30 U/L (ref 11–51)

## 2021-04-12 LAB — RESP PANEL BY RT-PCR (FLU A&B, COVID) ARPGX2
Influenza A by PCR: NEGATIVE
Influenza B by PCR: NEGATIVE
SARS Coronavirus 2 by RT PCR: NEGATIVE

## 2021-04-12 MED ORDER — KETOROLAC TROMETHAMINE 30 MG/ML IJ SOLN
30.0000 mg | Freq: Once | INTRAMUSCULAR | Status: AC
  Administered 2021-04-12: 30 mg via INTRAVENOUS
  Filled 2021-04-12: qty 1

## 2021-04-12 MED ORDER — LACTATED RINGERS IV BOLUS
1000.0000 mL | Freq: Once | INTRAVENOUS | Status: AC
  Administered 2021-04-12: 1000 mL via INTRAVENOUS

## 2021-04-12 MED ORDER — SODIUM CHLORIDE 0.9 % IV SOLN
1.0000 g | Freq: Once | INTRAVENOUS | Status: AC
  Administered 2021-04-12: 1 g via INTRAVENOUS
  Filled 2021-04-12: qty 10

## 2021-04-12 MED ORDER — IOHEXOL 300 MG/ML  SOLN
100.0000 mL | Freq: Once | INTRAMUSCULAR | Status: AC | PRN
  Administered 2021-04-12: 100 mL via INTRAVENOUS

## 2021-04-12 MED ORDER — ONDANSETRON 4 MG PO TBDP: 4.0000 mg | ORAL_TABLET | Freq: Three times a day (TID) | ORAL | 0 refills | Status: DC | PRN

## 2021-04-12 MED ORDER — CEFDINIR 300 MG PO CAPS: 300.0000 mg | ORAL_CAPSULE | Freq: Two times a day (BID) | ORAL | 0 refills | Status: DC

## 2021-04-12 MED ORDER — ONDANSETRON HCL 4 MG/2ML IJ SOLN
4.0000 mg | Freq: Once | INTRAMUSCULAR | Status: AC
  Administered 2021-04-12: 4 mg via INTRAVENOUS
  Filled 2021-04-12: qty 2

## 2021-04-12 NOTE — ED Notes (Signed)
Patient transported to CT 

## 2021-04-12 NOTE — ED Provider Notes (Signed)
[place] EMERGENCY DEPARTMENT Provider Note   CSN: [id number] Arrival date & time: 04/12/21  1617     History Chief Complaint  Patient presents with   Flank Pain    Bonnie Lowe is a 50 y.o. female.  HPI     1.5 weeks ago had malodorous urine, had kidney infection 5-6 years ago and that was the only symptom, tried to drink water Didn't have any pain until Sunday night, woke up with chills, shaking, had dry heaves, nausea, went to urgent care yesterday, given macrobid, took 3 of those, this afternoon feeling worse. Worse nausea, can't eat or drink, mouth so dry, drinking makes her nauseas, pain in left flank pain.  Constant pain left flank and lower back, if roll over or touch the pain worse stabbing, but lower back constant ache and left abdomen with pain too.  Feel dehydrated, nauseas.  A little constipation, no diarrhea. Don't get fevers, on arrival here 99.5 which is high for her.  Headache mild.  No cough, congestion, sore throat. No hx of kidney stones  Past Medical History:  Diagnosis Date   Angio-edema    Asthma    Hypertension     Patient Active Problem List   Diagnosis Date Noted   Allergic conjunctivitis of both eyes 02/08/2021   Moderate persistent asthma without complication [id number]   GERD (gastroesophageal reflux disease) 01/12/2020   Cough, persistent 11/24/2019   Seasonal and perennial allergic rhinoconjunctivitis 08/30/2018   Adverse effect of other drugs, medicaments and biological substances, subsequent encounter 08/30/2018    Past Surgical History:  Procedure Laterality Date   ANAL RECTAL MANOMETRY N/A 12/22/2020   Procedure: ANO RECTAL MANOMETRY;  Surgeon: Leighton Ruff, MD;  Location: [facility];  Service: Endoscopy;  Laterality: N/A;   NO PAST SURGERIES       OB History   No obstetric history on file.     Family History  Problem Relation Age of Onset   Allergic rhinitis Mother    Angioedema Neg Hx    Asthma Neg Hx     Atopy Neg Hx    Immunodeficiency Neg Hx    Urticaria Neg Hx    Eczema Neg Hx     Social History   Tobacco Use   Smoking status: Never   Smokeless tobacco: Never  Vaping Use   Vaping Use: Never used  Substance Use Topics   Alcohol use: No   Drug use: Never    Home Medications Prior to Admission medications   Medication Sig Start Date End Date Taking? Authorizing Provider  cefdinir (OMNICEF) 300 MG capsule Take 1 capsule (300 mg total) by mouth 2 (two) times daily for 14 days. 04/12/21 04/26/21 Yes Gareth Morgan, MD  ondansetron (ZOFRAN ODT) 4 MG disintegrating tablet Take 1 tablet (4 mg total) by mouth every 8 (eight) hours as needed for nausea or vomiting. 04/12/21  Yes Gareth Morgan, MD  albuterol (PROVENTIL) (2.5 MG/3ML) 0.083% nebulizer solution Take 3 mLs (2.5 mg total) by nebulization every 4 (four) hours as needed for wheezing or shortness of breath (coughing fits). 10/27/20   Garnet Sierras, DO  albuterol (VENTOLIN HFA) 108 (90 Base) MCG/ACT inhaler Inhale 2 puffs into the lungs every 4 (four) hours as needed. 02/07/19   Garnet Sierras, DO  Azelastine-Fluticasone 137-50 MCG/ACT SUSP USE 1 SPRAY INTO EACH NOSTRIL TWICE A DAY Patient taking differently: USE 1 SPRAY INTO EACH NOSTRIL TWICE A DAY as needed 10/04/20   Ambs, Kathrine Cords, FNP  Biotin 5000 MCG CAPS 1 capsule    [provider]  budesonide (PULMICORT) 0.5 MG/2ML nebulizer solution Take 2 mLs (0.5 mg total) by nebulization in the morning and at bedtime. Use for 1-2 weeks at a time during upper respiratory infections. Patient not taking: Reported on 02/08/2021 10/27/20   Garnet Sierras, DO  budesonide-formoterol [facility]) 160-4.5 MCG/ACT inhaler Inhale 2 puffs into the lungs 2 (two) times daily. Patient taking differently: Inhale 2 puffs into the lungs as needed. 11/05/20   Garnet Sierras, DO  cetirizine (ZYRTEC) 10 MG tablet Take 10 mg by mouth daily.    [provider]  EPINEPHrine (AUVI-Q) 0.3 mg/0.3 mL IJ SOAJ  injection Inject 0.3 mLs (0.3 mg total) into the muscle as needed for anaphylaxis. 07/04/19   Garnet Sierras, DO  furosemide (LASIX) 40 MG tablet Take 40 mg by mouth daily. 11/05/20   [provider]  ibuprofen (ADVIL) 800 MG tablet 2 tabs    [provider]  ipratropium (ATROVENT) 0.03 % nasal spray 1-2 sprays each nostril 2-3 times daily as needed 01/12/20   Bobbitt, Sedalia Muta, MD  levothyroxine (SYNTHROID) 75 MCG tablet Take by mouth. 11/03/20   [provider]  liothyronine (CYTOMEL) 5 MCG tablet Take 5 mcg by mouth daily.    [provider]  losartan (COZAAR) 50 MG tablet Take 50 mg by mouth daily. 01/27/19   [provider]  montelukast (SINGULAIR) 10 MG tablet Take 10 mg by mouth at bedtime.    [provider]  NON FORMULARY 2 injections week    [provider]  predniSONE (DELTASONE) 10 MG tablet Start during asthma flare - Take prednisone 40mg  daily x 2 days, 30mg  daily x 2 days, 20mg  daily x 2 days and 10mg  daily x 2 days. Patient not taking: Reported on 02/08/2021 12/28/20   Garnet Sierras, DO  Semaglutide,0.25 or 0.5MG /DOS, 2 MG/1.5ML SOPN Inject into the skin. Patient not taking: Reported on 02/08/2021 11/03/20   [provider]  spironolactone (ALDACTONE) 25 MG tablet Take 25 mg by mouth daily.    [provider]  valACYclovir (VALTREX) 1000 MG tablet 1,000 mg as needed. 01/28/19   [provider]  zolpidem (AMBIEN) 10 MG tablet Take 10 mg by mouth at bedtime as needed. 02/05/20   [provider]    Allergies    Moxifloxacin hcl, Citrus, Erythromycin, and Sulfa antibiotics  Review of Systems   Review of Systems  Constitutional:  Positive for appetite change, chills and fatigue. Negative for fever.  HENT:  Negative for congestion and sore throat.   Eyes:  Negative for visual disturbance.  Respiratory:  Negative for cough and shortness of breath.   Cardiovascular:  Negative for chest pain.   Gastrointestinal:  Positive for abdominal pain, constipation and nausea. Negative for diarrhea and vomiting.  Genitourinary:  Positive for flank pain. Negative for difficulty urinating and dysuria.  Musculoskeletal:  Positive for back pain. Negative for neck pain.  Skin:  Negative for rash.  Neurological:  Positive for headaches. Negative for syncope.   Physical Exam Updated Vital Signs BP 129/82   Pulse (!) 109   Temp 99.5 F (37.5 C) (Oral)   Resp 20   Ht 5\' 5"  (1.651 m)   Wt 69.4 kg   SpO2 98%   BMI 25.46 kg/m   Physical Exam Vitals and nursing note reviewed.  Constitutional:      General: She is not in acute distress.  Appearance: She is well-developed. She is not diaphoretic.  HENT:     Head: Normocephalic and atraumatic.  Eyes:     Conjunctiva/sclera: Conjunctivae normal.  Cardiovascular:     Rate and Rhythm: Regular rhythm. Tachycardia present.     Heart sounds: Normal heart sounds. No murmur heard.   No friction rub. No gallop.  Pulmonary:     Effort: Pulmonary effort is normal. No respiratory distress.     Breath sounds: Normal breath sounds. No wheezing or rales.  Abdominal:     General: There is no distension.     Palpations: Abdomen is soft.     Tenderness: There is abdominal tenderness (LLQ). There is left CVA tenderness. There is no guarding.  Musculoskeletal:        General: No tenderness.     Cervical back: Normal range of motion.  Skin:    General: Skin is warm and dry.     Findings: No erythema or rash.  Neurological:     Mental Status: She is alert and oriented to person, place, and time.    ED Results / Procedures / Treatments   Labs (all labs ordered are listed, but only abnormal results are displayed) Labs Reviewed  URINALYSIS, ROUTINE W REFLEX MICROSCOPIC - Abnormal; Notable for the following components:      Result Value   Hgb urine dipstick MODERATE (*)    Bilirubin Urine SMALL (*)    Ketones, ur >=80 (*)    Protein, ur 100 (*)     Leukocytes,Ua TRACE (*)    All other components within normal limits  CBC WITH DIFFERENTIAL/PLATELET - Abnormal; Notable for the following components:   WBC 16.6 (*)    Neutro Abs 13.6 (*)    Monocytes Absolute 1.3 (*)    Abs Immature Granulocytes 0.08 (*)    All other components within normal limits  COMPREHENSIVE METABOLIC PANEL - Abnormal; Notable for the following components:   Sodium 134 (*)    CO2 20 (*)    Glucose, Bld 112 (*)    All other components within normal limits  URINALYSIS, MICROSCOPIC (REFLEX) - Abnormal; Notable for the following components:   Bacteria, UA FEW (*)    All other components within normal limits  RESP PANEL BY RT-PCR (FLU A&B, COVID) ARPGX2  URINE CULTURE  LACTIC ACID, PLASMA  LIPASE, BLOOD  LACTIC ACID, PLASMA    EKG None  Radiology CT ABDOMEN PELVIS W CONTRAST  Result Date: 04/12/2021 CLINICAL DATA:  Suspect diverticulitis.  UTI. EXAM: CT ABDOMEN AND PELVIS WITH CONTRAST TECHNIQUE: Multidetector CT imaging of the abdomen and pelvis was performed using the standard protocol following bolus administration of intravenous contrast. CONTRAST:  172mL OMNIPAQUE IOHEXOL 300 MG/ML  SOLN COMPARISON:  CT abdomen and pelvis 01/09/2015. FINDINGS: Lower chest: No acute abnormality. Hepatobiliary: There is a hypodensity in the left lobe of the liver which is too small to characterize and unchanged, likely a cyst or hemangioma. The liver is otherwise within normal limits. Gallbladder and bile ducts are within normal limits. Pancreas: Unremarkable. No pancreatic ductal dilatation or surrounding inflammatory changes. Spleen: Normal in size without focal abnormality. Adrenals/Urinary Tract: There is patchy parenchymal hypodensity throughout the left kidney concerning for pyelonephritis. There is mild left perinephric and Peri ureteral stranding. No obstructing calculi are seen. The bladder, right kidney, and adrenal glands are within normal limits. Stomach/Bowel:  Stomach is within normal limits. Appendix appears normal. No evidence of bowel wall thickening, distention, or inflammatory changes. There are scattered air-fluid  levels throughout small bowel. There is a large amount of stool in the rectum. Vascular/Lymphatic: No significant vascular findings are present. No enlarged abdominal or pelvic lymph nodes. Reproductive: Uterus and bilateral adnexa are unremarkable. Other: There is a small fat containing umbilical hernia. There is no ascites. Musculoskeletal: No acute or significant osseous findings. IMPRESSION: 1. Findings compatible with left-sided pyelonephritis. 2. Scattered air-fluid levels throughout small bowel loops. Findings are nonspecific, but can be seen with enteritis. Electronically Signed   By: Darliss Cheney M.D.   On: 04/12/2021 18:54    Procedures Procedures   Medications Ordered in ED Medications  lactated ringers bolus 1,000 mL ( Intravenous Stopped 04/12/21 1824)  cefTRIAXone (ROCEPHIN) 1 g in sodium chloride 0.9 % 100 mL IVPB (0 g Intravenous Stopped 04/12/21 1815)  ketorolac (TORADOL) 30 MG/ML injection 30 mg (30 mg Intravenous Given 04/12/21 1752)  lactated ringers bolus 1,000 mL (0 mLs Intravenous Stopped 04/12/21 2031)  ondansetron (ZOFRAN) injection 4 mg (4 mg Intravenous Given 04/12/21 1857)  iohexol (OMNIPAQUE) 300 MG/ML solution 100 mL (100 mLs Intravenous Contrast Given 04/12/21 1828)    ED Course  I have reviewed the triage vital signs and the nursing notes.  Pertinent labs & imaging results that were available during my care of the patient were reviewed by me and considered in my medical decision making (see chart for details).    MDM Rules/Calculators/A&P                            50 year old female with history of hypertension, asthma presents with concern for 1-1/2 weeks of foul-smelling urine, increasing chills, nausea and left flank and abdominal pain.  Tachycardia to 130s on arrival, temp 99.5, given history  concerning for possible sepsis secondary to urinary source, ordered IV fluids and rocephin.  UA with WBC, neg nitrites, few bacteria--ordered CT to evaluate for other etiologies of symptoms such as diverticulitis, evaluate for signs of nephrolithiasis.  CT shows findings compatible with left-sided pyelonephritis, nonspecific air-fluid levels.  Heart rate improved to 100s, pain and nausea improved following medications and IV fluids.  Labs show leukocytosis, however normal renal function, normal lactic acid, and given that she received IV antibiotics and fluid tonight in the emergency department, feel she is stable for continued outpatient treatment of her pyelonephritis with strict return precautions.  Given prescription for cefdinir and zofran and discussed reasons to return in detail. Patient discharged in stable condition with understanding of reasons to return.      Final Clinical Impression(s) / ED Diagnoses Final diagnoses:  Pyelonephritis    Rx / DC Orders ED Discharge Orders          Ordered    ondansetron (ZOFRAN ODT) 4 MG disintegrating tablet  Every 8 hours PRN        04/12/21 2010    cefdinir (OMNICEF) 300 MG capsule  2 times daily        04/12/21 2010             Alvira Monday, MD 04/13/21 [phone]

## 2021-04-12 NOTE — Discharge Instructions (Addendum)
You may take Tylenol 1000 mg 4 times a day for 1 week. This is the maximum dose of Tylenol usually take from all sources. Please check other over-the-counter medications and prescriptions to ensure you are not taking other medications that contain acetaminophen.  You may also take ibuprofen 400 mg 6 times a day alternating with or at the same time as tylenol.

## 2021-04-12 NOTE — ED Triage Notes (Signed)
Pt went to UC yesterday for 1 week of malodorous urine. Given Macrobid for UTI. States has nausea, generalized chills. Took abx x 3 doses, but feeling worse. C/o left abdominal and flank pain. Denies hx of kidney stones.

## 2021-04-13 LAB — URINE CULTURE: Culture: <10000 — AB

## 2021-04-14 DIAGNOSIS — J3089 Other allergic rhinitis: Secondary | ICD-10-CM | POA: Diagnosis not present

## 2021-04-15 ENCOUNTER — Ambulatory Visit (INDEPENDENT_AMBULATORY_CARE_PROVIDER_SITE_OTHER): Payer: BC Managed Care – PPO

## 2021-04-15 DIAGNOSIS — J309 Allergic rhinitis, unspecified: Secondary | ICD-10-CM

## 2021-04-15 NOTE — Progress Notes (Signed)
VIALS MADE. EXP 04-15-22 

## 2021-04-16 ENCOUNTER — Other Ambulatory Visit: Payer: Self-pay | Admitting: Family Medicine

## 2021-04-19 ENCOUNTER — Encounter: Payer: Self-pay | Admitting: Family Medicine

## 2021-04-19 ENCOUNTER — Other Ambulatory Visit: Payer: Self-pay

## 2021-04-19 ENCOUNTER — Ambulatory Visit: Payer: BC Managed Care – PPO | Admitting: Family Medicine

## 2021-04-19 ENCOUNTER — Ambulatory Visit (INDEPENDENT_AMBULATORY_CARE_PROVIDER_SITE_OTHER): Payer: BC Managed Care – PPO | Admitting: Family Medicine

## 2021-04-19 VITALS — BP 140/80 | HR 88 | Temp 98.1°F | Resp 20 | Ht 65.0 in | Wt 150.6 lb

## 2021-04-19 DIAGNOSIS — J988 Other specified respiratory disorders: Secondary | ICD-10-CM

## 2021-04-19 DIAGNOSIS — J3089 Other allergic rhinitis: Secondary | ICD-10-CM | POA: Diagnosis not present

## 2021-04-19 DIAGNOSIS — J4541 Moderate persistent asthma with (acute) exacerbation: Secondary | ICD-10-CM

## 2021-04-19 DIAGNOSIS — H1013 Acute atopic conjunctivitis, bilateral: Secondary | ICD-10-CM

## 2021-04-19 DIAGNOSIS — J302 Other seasonal allergic rhinitis: Secondary | ICD-10-CM

## 2021-04-19 DIAGNOSIS — B9689 Other specified bacterial agents as the cause of diseases classified elsewhere: Secondary | ICD-10-CM

## 2021-04-19 MED ORDER — ALBUTEROL SULFATE HFA 108 (90 BASE) MCG/ACT IN AERS: 2.0000 | INHALATION_SPRAY | RESPIRATORY_TRACT | 1 refills | Status: DC | PRN

## 2021-04-19 MED ORDER — BUDESONIDE-FORMOTEROL FUMARATE 160-4.5 MCG/ACT IN AERO
2.0000 | INHALATION_SPRAY | Freq: Two times a day (BID) | RESPIRATORY_TRACT | 5 refills | Status: DC
Start: 2021-04-19 — End: 2021-06-01

## 2021-04-19 MED ORDER — IPRATROPIUM BROMIDE 0.03 % NA SOLN: NASAL | 1 refills | Status: DC

## 2021-04-19 MED ORDER — MONTELUKAST SODIUM 10 MG PO TABS: 10.0000 mg | ORAL_TABLET | Freq: Every day | ORAL | 5 refills | Status: DC

## 2021-04-19 MED ORDER — ALBUTEROL SULFATE (2.5 MG/3ML) 0.083% IN NEBU: 2.5000 mg | INHALATION_SOLUTION | RESPIRATORY_TRACT | 1 refills | Status: DC | PRN

## 2021-04-19 NOTE — Progress Notes (Signed)
[address] [place] [address] Dept: [phone]  FOLLOW UP NOTE  Patient ID: Bonnie Lowe, female    DOB: March 10, 1971  Age: 50 y.o. MRN: [id number] Date of Office Visit: 04/19/2021  Assessment  Chief Complaint: Allergic Rhinitis , Asthma, and Cough (Started last Saturday night. She was also diagnosed with a kidney infection and put on   cefdinir she broke with hives and had diarrhea, went to urgent care this Sunday and put her on amoxicillin-clav mg 1 tablet twice daily for  days)  HPI Bonnie Lowe is a 50 year old female who presents to the clinic for follow-up visit.  She was last seen in this clinic on 02/08/2021 for evaluation of asthma, allergic rhinitis on allergen immunotherapy, and allergic conjunctivitis.  In the interim, she has recently been to the emergency department for kidney infection for which she was taking cefdinir.  She reports that on Saturday she began to experience pruritus and hives and went back to urgent care on Sunday and was prescribed Augmentin to take the place of cefdinir.  She reports that on Monday she went to her primary care doctor for symptoms of asthma and allergic rhinitis and was prescribed cough medicine, Tessalon Perles, and was given a steroid injection in the office.  At today's visit, she reports her asthma has been poorly controlled with symptoms including shortness of breath which is worse when lying down, occasional wheeze, and cough which is some dry and some producing thick yellow mucus.  She began Symbicort 160-2 puffs twice a day with a spacer on Saturday.  She stopped taking montelukast several months ago as she wanted to try to cut down on her daily medications and she has been out of albuterol for several months.  Allergic rhinitis is reported as poorly controlled with clear thin rhinorrhea that began yesterday, nasal congestion, and copious postnasal drainage.  She does report a slight headache in the frontal area which comes and goes and  is relieved by pain medications.  She continues Dymista nasal spray daily and occasionally uses nasal saline rinses and ipratropium.  She reports she stopped taking cetirizine about 6 or 8 weeks ago in an effort to decrease her daily medication intake.  Allergic conjunctivitis is reported as well controlled with no medical intervention at this time.  Her current medications are listed in the chart.   Drug Allergies:  Allergies  Allergen Reactions   Moxifloxacin Hcl Itching   Cefdinir Hives and Diarrhea   Citrus Diarrhea   Erythromycin    Pineapple Other (See Comments)   Sulfa Antibiotics     Physical Exam: BP 140/80 (BP Location: Left Arm, Patient Position: Sitting, Cuff Size: Normal)   Pulse 88   Temp 98.1 F (36.7 C) (Temporal)   Resp 20   Ht 5\' 5"  (1.651 m)   Wt 150 lb 9.6 oz (68.3 kg)   SpO2 96%   BMI 25.06 kg/m    Physical Exam Vitals reviewed.  Constitutional:      Appearance: Normal appearance.  HENT:     Head: Normocephalic and atraumatic.     Right Ear: Tympanic membrane normal.     Left Ear: Tympanic membrane normal.     Nose:     Comments: Bilateral nares slightly erythematous with clear nasal drainage noted.  Pharynx normal.  Ears normal.  Eyes normal.    Mouth/Throat:     Pharynx: Oropharynx is clear.  Eyes:     Conjunctiva/sclera: Conjunctivae normal.  Cardiovascular:  Rate and Rhythm: Normal rate and regular rhythm.     Heart sounds: Normal heart sounds. No murmur heard. Pulmonary:     Effort: Pulmonary effort is normal.     Breath sounds: Normal breath sounds.     Comments: Lungs clear to auscultation Musculoskeletal:        General: Normal range of motion.     Cervical back: Normal range of motion and neck supple.  Skin:    General: Skin is warm and dry.  Neurological:     Mental Status: She is alert and oriented to person, place, and time.  Psychiatric:        Mood and Affect: Mood normal.        Behavior: Behavior normal.        Thought  Content: Thought content normal.        Judgment: Judgment normal.    Diagnostics: FVC 2.92, FEV1 2.40.  Predicted FVC 3.50, predicted FEV1 2.78.  Spirometry indicates normal ventilatory function.  Assessment and Plan: 1. Moderate persistent asthma with acute exacerbation   2. Seasonal and perennial allergic rhinitis   3. Allergic conjunctivitis of both eyes   4. Bacterial respiratory infection     Meds ordered this encounter  Medications   ipratropium (ATROVENT) 0.03 % nasal spray    Sig: 1-2 sprays each nostril 2-3 times daily as needed    Dispense:  90 mL    Refill:  1    Dispense 90 day supply   albuterol (VENTOLIN HFA) 108 (90 Base) MCG/ACT inhaler    Sig: Inhale 2 puffs into the lungs every 4 (four) hours as needed.    Dispense:  18 g    Refill:  1   albuterol (PROVENTIL) (2.5 MG/3ML) 0.083% nebulizer solution    Sig: Take 3 mLs (2.5 mg total) by nebulization every 4 (four) hours as needed for wheezing or shortness of breath.    Dispense:  75 mL    Refill:  1   budesonide-formoterol (SYMBICORT) 160-4.5 MCG/ACT inhaler    Sig: Inhale 2 puffs into the lungs 2 (two) times daily.    Dispense:  10.2 g    Refill:  5   montelukast (SINGULAIR) 10 MG tablet    Sig: Take 1 tablet (10 mg total) by mouth at bedtime.    Dispense:  31 tablet    Refill:  5     Patient Instructions  Asthma Continue montelukast 10 mg once a day to prevent cough or wheeze Continue albuterol 2 puffs once every 4 hours as needed for cough or wheeze You may use albuterol 2 puffs 5 to 15 minutes before activity to decrease cough or wheeze Continue Symbicort 160-2 puffs twice a day with a spacer to prevent cough or wheeze If your asthma symptome do not improve, then begin Spiriva 1.25 mcg 2 puffs once a day to prevent cough or wheeze.  Stop Spiriva when the sample is gone Asthma control goals:  Full participation in all desired activities (may need albuterol before activity) Albuterol use two time or  less a week on average (not counting use with activity) Cough interfering with sleep two time or less a month Oral steroids no more than once a year No hospitalizations  Possible upper respiratory infection Continue Augmentin as prescribed by your primary care provider Follow-up with your primary care provider as directed for worsening symptoms of possible upper respiratory infection Call the clinic if your symptoms worsen or if you develop a fever  Allergic  rhinitis Continue allergen avoidance measures directed toward tree pollen, weed pollen, mold, dust mite, and dog as listed below Continue cetirizine 10 mg once a day as needed for runny nose Continue Atrovent 2 sprays in each nostril twice a day as needed for runny nose Continue Dymista 2 sprays in each nostril twice a day as needed for nasal symptoms (Samples of Ryaltris given 2 sprays in each nostril up to twice a day as needed. Use either Dymista or Ryaltris)  Consider saline nasal rinses as needed for nasal symptoms. Use this before any medicated nasal sprays for best result  Allergic conjunctivitis Some over the counter eye drops include Pataday one drop in each eye once a day as needed for red, itchy eyes OR Zaditor one drop in each eye twice a day as needed for red itchy eyes.  Drug reaction Continue to avoid Avelox, sulfa drugs, erythromycin, and cefdinir  Call the clinic if this treatment plan is not working well for you.  Follow up in 1 month or sooner if needed.   Return in about 4 weeks (around 05/17/2021), or if symptoms worsen or fail to improve.    Thank you for the opportunity to care for this patient.  Please do not hesitate to contact me with questions.  Thermon Leyland, FNP [facility]

## 2021-04-19 NOTE — Patient Instructions (Addendum)
Asthma Continue montelukast 10 mg once a day to prevent cough or wheeze Continue albuterol 2 puffs once every 4 hours as needed for cough or wheeze You may use albuterol 2 puffs 5 to 15 minutes before activity to decrease cough or wheeze Continue Symbicort 160-2 puffs twice a day with a spacer to prevent cough or wheeze If your asthma symptome do not improve, then begin Spiriva 1.25 mcg 2 puffs once a day to prevent cough or wheeze.  Stop Spiriva when the sample is gone The steroid injection that you received from your primary care provider should significantly reduce your symptoms of asthma Asthma control goals:  Full participation in all desired activities (may need albuterol before activity) Albuterol use two time or less a week on average (not counting use with activity) Cough interfering with sleep two time or less a month Oral steroids no more than once a year No hospitalizations  Possible respiratory infection Continue Augmentin as prescribed by your primary care provider Follow-up with your primary care provider as directed for worsening symptoms of possible upper respiratory infection Call the clinic if your symptoms worsen or if you develop a fever  Allergic rhinitis Continue allergen avoidance measures directed toward tree pollen, weed pollen, mold, dust mite, and dog as listed below Continue cetirizine 10 mg once a day as needed for runny nose Continue Atrovent 2 sprays in each nostril twice a day as needed for runny nose Continue Dymista 2 sprays in each nostril twice a day as needed for nasal symptoms (Samples of Ryaltris given 2 sprays in each nostril up to twice a day as needed. Use either Dymista or Ryaltris)  Consider saline nasal rinses as needed for nasal symptoms. Use this before any medicated nasal sprays for best result  Allergic conjunctivitis Some over the counter eye drops include Pataday one drop in each eye once a day as needed for red, itchy eyes OR Zaditor one  drop in each eye twice a day as needed for red itchy eyes.  Drug reaction Continue to avoid Avelox, sulfa drugs, erythromycin, and cefdinir  Call the clinic if this treatment plan is not working well for you.  Follow up in 1 month or sooner if needed.  Reducing Pollen Exposure The American Academy of Allergy, Asthma and Immunology suggests the following steps to reduce your exposure to pollen during allergy seasons. Do not hang sheets or clothing out to dry; pollen may collect on these items. Do not mow lawns or spend time around freshly cut grass; mowing stirs up pollen. Keep windows closed at night.  Keep car windows closed while driving. Minimize morning activities outdoors, a time when pollen counts are usually at their highest. Stay indoors as much as possible when pollen counts or humidity is high and on windy days when pollen tends to remain in the air longer. Use air conditioning when possible.  Many air conditioners have filters that trap the pollen spores. Use a HEPA room air filter to remove pollen form the indoor air you breathe.  Control of Mold Allergen Mold and fungi can grow on a variety of surfaces provided certain temperature and moisture conditions exist.  Outdoor molds grow on plants, decaying vegetation and soil.  The major outdoor mold, Alternaria and Cladosporium, are found in very high numbers during hot and dry conditions.  Generally, a late Summer - Fall peak is seen for common outdoor fungal spores.  Rain will temporarily lower outdoor mold spore count, but counts rise rapidly when the  rainy period ends.  The most important indoor molds are Aspergillus and Penicillium.  Dark, humid and poorly ventilated basements are ideal sites for mold growth.  The next most common sites of mold growth are the bathroom and the kitchen.  Outdoor [employer] Use air conditioning and keep windows closed Avoid exposure to decaying vegetation. Avoid leaf raking. Avoid grain  handling. Consider wearing a face mask if working in moldy areas.  Indoor Mold Control Maintain humidity below 50%. Clean washable surfaces with 5% bleach solution. Remove sources e.g. Contaminated carpets.   Control of Dust Mite Allergen Dust mites play a major role in allergic asthma and rhinitis. They occur in environments with high humidity wherever human skin is found. Dust mites absorb humidity from the atmosphere (ie, they do not drink) and feed on organic matter (including shed human and animal skin). Dust mites are a microscopic type of insect that you cannot see with the naked eye. High levels of dust mites have been detected from mattresses, pillows, carpets, upholstered furniture, bed covers, clothes, soft toys and any woven material. The principal allergen of the dust mite is found in its feces. A gram of dust may contain 1,000 mites and 250,000 fecal particles. Mite antigen is easily measured in the air during house cleaning activities. Dust mites do not bite and do not cause harm to humans, other than by triggering allergies/asthma.  Ways to decrease your exposure to dust mites in your home:  1. Encase mattresses, box springs and pillows with a mite-impermeable barrier or cover  2. Wash sheets, blankets and drapes weekly in hot water (130 F) with detergent and dry them in a dryer on the hot setting.  3. Have the room cleaned frequently with a vacuum cleaner and a damp dust-mop. For carpeting or rugs, vacuuming with a vacuum cleaner equipped with a high-efficiency particulate air (HEPA) filter. The dust mite allergic individual should not be in a room which is being cleaned and should wait 1 hour after cleaning before going into the room.  4. Do not sleep on upholstered furniture (eg, couches).  5. If possible removing carpeting, upholstered furniture and drapery from the home is ideal. Horizontal blinds should be eliminated in the rooms where the person spends the most time  (bedroom, study, television room). Washable vinyl, roller-type shades are optimal.  6. Remove all non-washable stuffed toys from the bedroom. Wash stuffed toys weekly like sheets and blankets above.  7. Reduce indoor humidity to less than 50%. Inexpensive humidity monitors can be purchased at most hardware stores. Do not use a humidifier as can make the problem worse and are not recommended.  Control of Dog or Cat Allergen Avoidance is the best way to manage a dog or cat allergy. If you have a dog or cat and are allergic to dog or cats, consider removing the dog or cat from the home. If you have a dog or cat but don't want to find it a new home, or if your family wants a pet even though someone in the household is allergic, here are some strategies that may help keep symptoms at bay:  Keep the pet out of your bedroom and restrict it to only a few rooms. Be advised that keeping the dog or cat in only one room will not limit the allergens to that room. Don't pet, hug or kiss the dog or cat; if you do, wash your hands with soap and water. High-efficiency particulate air (HEPA) cleaners run continuously in  a bedroom or living room can reduce allergen levels over time. Regular use of a high-efficiency vacuum cleaner or a central vacuum can reduce allergen levels. Giving your dog or cat a bath at least once a week can reduce airborne allergen.

## 2021-04-20 ENCOUNTER — Encounter: Payer: Self-pay | Admitting: Family Medicine

## 2021-04-20 ENCOUNTER — Ambulatory Visit: Payer: BC Managed Care – PPO | Admitting: Family Medicine

## 2021-04-20 DIAGNOSIS — B9689 Other specified bacterial agents as the cause of diseases classified elsewhere: Secondary | ICD-10-CM | POA: Insufficient documentation

## 2021-04-20 DIAGNOSIS — J988 Other specified respiratory disorders: Secondary | ICD-10-CM | POA: Insufficient documentation

## 2021-04-20 DIAGNOSIS — J069 Acute upper respiratory infection, unspecified: Secondary | ICD-10-CM | POA: Insufficient documentation

## 2021-04-29 ENCOUNTER — Ambulatory Visit (INDEPENDENT_AMBULATORY_CARE_PROVIDER_SITE_OTHER): Payer: BC Managed Care – PPO

## 2021-04-29 DIAGNOSIS — J309 Allergic rhinitis, unspecified: Secondary | ICD-10-CM | POA: Diagnosis not present

## 2021-05-26 ENCOUNTER — Ambulatory Visit (INDEPENDENT_AMBULATORY_CARE_PROVIDER_SITE_OTHER): Payer: BC Managed Care – PPO

## 2021-05-26 DIAGNOSIS — J309 Allergic rhinitis, unspecified: Secondary | ICD-10-CM | POA: Diagnosis not present

## 2021-05-31 NOTE — Progress Notes (Signed)
[address] [place] [address] Dept: [phone]  FOLLOW UP NOTE  Patient ID: Bonnie Lowe, female    DOB: 1970-12-31  Age: 50 y.o. MRN: [id number] Date of Office Visit: 06/01/2021  Assessment  Chief Complaint: Follow-up (Pt states she have been feeling good, but she still have a dry cough. And an ear ache.), Allergic Rhinitis , and Asthma  HPI Bonnie Lowe is a 50 year old female who presents to the clinic for follow-up visit.  She was last seen in this clinic on 04/19/2021 for evaluation of asthma, allergic rhinitis, allergic conjunctivitis, and acute respiratory infection requiring Augmentin from an outside clinic.  At today's visit, she reports that she had a cold in November and continues to experience some symptoms including cough.  Asthma is reported as moderately well controlled with cough occurring producing mucus occurring mostly in the daytime especially in the morning.  She reports intermittent occasional dry cough.  She reports occasional shortness of breath while in a coughing fit.  She continues montelukast 10 mg once a day, Symbicort 160-2 puffs once a day and occasionally twice a day, and albuterol twice a day on a regular schedule.  Allergic rhinitis is reported as moderately well controlled with symptoms including nasal congestion, dry nostrils, and postnasal drainage with frequent throat clearing.  She has recently stopped taking cetirizine and continues Flonase daily with good technique, nasal saline rinses daily, and Atrovent nasal spray daily.  Allergic conjunctivitis is reported as well controlled with no medical intervention.  She denies symptoms of reflux including heartburn and vomiting.  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  She is not currently taking medication to control reflux.  She reports intermittent ear ache and pressure occurring in both ears that began yesterday.  She denies sore throat, fever, sick contacts, sweats, or chills.  Her current  medications are listed in the chart.   Drug Allergies:  Allergies  Allergen Reactions   Moxifloxacin Hcl Itching   Cefdinir Hives and Diarrhea   Citrus Diarrhea   Erythromycin    Pineapple Other (See Comments)   Sulfa Antibiotics     Physical Exam: BP 128/68    Pulse 99    Temp 97.9 F (36.6 C) (Temporal)    Resp 17    Ht 5\' 5"  (1.651 m)    Wt 150 lb 9.6 oz (68.3 kg)    SpO2 98%    BMI 25.06 kg/m    Physical Exam Vitals reviewed.  Constitutional:      Appearance: Normal appearance.  HENT:     Head: Normocephalic and atraumatic.     Right Ear: Tympanic membrane normal.     Left Ear: Tympanic membrane normal.     Nose:     Comments: Bilateral nares slightly erythematous with clear nasal drainage noted.  Pharynx slightly erythematous with no exudate.  Ears normal.  Eyes normal. Eyes:     Conjunctiva/sclera: Conjunctivae normal.  Cardiovascular:     Rate and Rhythm: Normal rate and regular rhythm.     Heart sounds: Normal heart sounds. No murmur heard. Pulmonary:     Effort: Pulmonary effort is normal.     Breath sounds: Normal breath sounds.     Comments: Lungs clear to auscultation Musculoskeletal:        General: Normal range of motion.     Cervical back: Normal range of motion and neck supple.  Skin:    General: Skin is warm and dry.  Neurological:  Mental Status: She is alert and oriented to person, place, and time.  Psychiatric:        Mood and Affect: Mood normal.        Behavior: Behavior normal.        Thought Content: Thought content normal.        Judgment: Judgment normal.    Diagnostics: FVC 3.57, FEV1 2.75.  Predicted FVC 3.50, predicted FEV1 2.78.  Spirometry indicates normal ventilatory function.  Assessment and Plan: 1. Moderate persistent asthma without complication   2. Seasonal and perennial allergic rhinitis   3. Dysfunction of both eustachian tubes   4. Gastroesophageal reflux disease, unspecified whether esophagitis present     Meds  ordered this encounter  Medications   albuterol (VENTOLIN HFA) 108 (90 Base) MCG/ACT inhaler    Sig: Inhale 2 puffs into the lungs every 4 (four) hours as needed.    Dispense:  18 g    Refill:  1   Azelastine-Fluticasone 137-50 MCG/ACT SUSP    Sig: SPRAY 1 SPRAY INTO EACH NOSTRIL TWICE A DAY    Dispense:  69 g    Refill:  1    Dispense 90 day supply.   budesonide-formoterol (SYMBICORT) 160-4.5 MCG/ACT inhaler    Sig: Inhale 2 puffs into the lungs 2 (two) times daily.    Dispense:  10.2 g    Refill:  5   ipratropium (ATROVENT) 0.03 % nasal spray    Sig: 1-2 sprays each nostril 2-3 times daily as needed    Dispense:  90 mL    Refill:  1    Dispense 90 day supply   montelukast (SINGULAIR) 10 MG tablet    Sig: Take 1 tablet (10 mg total) by mouth at bedtime.    Dispense:  90 tablet    Refill:  1    Please dispense 90 day supply.   omeprazole (PRILOSEC) 40 MG capsule    Sig: Take 1 capsule (40 mg total) by mouth daily.    Dispense:  30 capsule    Refill:  5    Patient Instructions  Asthma Continue montelukast 10 mg once a day to prevent cough or wheeze Increase Symbicort 160 to 2 puffs twice a day with a spacer to prevent cough or wheeze continue albuterol 2 puffs once every 4 hours as needed for cough or wheeze You may use albuterol 2 puffs 5 to 15 minutes before activity to decrease cough or wheeze  Full participation in all desired activities (may need albuterol before activity) Albuterol use two time or less a week on average (not counting use with activity) Cough interfering with sleep two time or less a month Oral steroids no more than once a year No hospitalizations  Allergic rhinitis Continue allergen avoidance measures directed toward tree pollen, weed pollen, mold, dust mite, and dog as listed below Continue cetirizine 10 mg once a day as needed for runny nose Continue Atrovent 2 sprays in each nostril twice a day as needed for runny nose Continue Flonase  (fluticasone) 2 sprays in each nostril once a day for a stuffy nose. In the right nostril, point the applicator out toward the right ear. In the left nostril, point the applicator out toward the left ear Consider saline nasal rinses as needed for nasal symptoms. Use this before any medicated nasal sprays for best result  Allergic conjunctivitis Some over the counter eye drops include Pataday one drop in each eye once a day as needed for red, itchy eyes  OR Zaditor one drop in each eye twice a day as needed for red itchy eyes.  Reflux Begin omeprazole 40 mg once a day to prevent reflux Begin dietary lifestyle modifications as listed below  Eustachian tube dysfunction Continue nasal saline rinses and Flonase as listed above. Call the clinic if your symptoms worsen  Call the clinic if this treatment plan is not working well for you.  Follow up in 2 months or sooner if needed.   Return in about 2 months (around 08/02/2021).    Thank you for the opportunity to care for this patient.  Please do not hesitate to contact me with questions.  Thermon Leyland, FNP [facility]

## 2021-05-31 NOTE — Patient Instructions (Addendum)
Asthma Continue montelukast 10 mg once a day to prevent cough or wheeze Increase Symbicort 160 to 2 puffs twice a day with a spacer to prevent cough or wheeze continue albuterol 2 puffs once every 4 hours as needed for cough or wheeze You may use albuterol 2 puffs 5 to 15 minutes before activity to decrease cough or wheeze  Full participation in all desired activities (may need albuterol before activity) Albuterol use two time or less a week on average (not counting use with activity) Cough interfering with sleep two time or less a month Oral steroids no more than once a year No hospitalizations  Allergic rhinitis Continue allergen avoidance measures directed toward tree pollen, weed pollen, mold, dust mite, and dog as listed below Continue cetirizine 10 mg once a day as needed for runny nose Continue Atrovent 2 sprays in each nostril twice a day as needed for runny nose Continue Flonase (fluticasone) 2 sprays in each nostril once a day for a stuffy nose. In the right nostril, point the applicator out toward the right ear. In the left nostril, point the applicator out toward the left ear Consider saline nasal rinses as needed for nasal symptoms. Use this before any medicated nasal sprays for best result  Allergic conjunctivitis Some over the counter eye drops include Pataday one drop in each eye once a day as needed for red, itchy eyes OR Zaditor one drop in each eye twice a day as needed for red itchy eyes.  Reflux Begin omeprazole 40 mg once a day to prevent reflux Begin dietary lifestyle modifications as listed below  Eustachian tube dysfunction Continue nasal saline rinses and Flonase as listed above. Call the clinic if your symptoms worsen  Call the clinic if this treatment plan is not working well for you.  Follow up in 2 months or sooner if needed.   Lifestyle Changes for Controlling GERD When you have GERD, stomach acid feels as if its backing up toward your  mouth. Whether or not you take medication to control your GERD, your symptoms can often be improved with lifestyle changes.   Raise Your Head Reflux is more likely to strike when youre lying down flat, because stomach fluid can flow backward more easily. Raising the head of your bed 4-6 inches can help. To do this: Slide blocks or books under the legs at the head of your bed. Or, place a wedge under the mattress. Many foam stores can make a suitable wedge for you. The wedge should run from your waist to the top of your head. Dont just prop your head on several pillows. This increases pressure on your stomach. It can make GERD worse.  Watch Your Eating Habits Certain foods may increase the acid in your stomach or relax the lower esophageal sphincter, making GERD more likely. Its best to avoid the following: Coffee, tea, and carbonated drinks (with and without caffeine) Fatty, fried, or spicy food Mint, chocolate, onions, and tomatoes Any other foods that seem to irritate your stomach or cause you pain  Relieve the Pressure Eat smaller meals, even if you have to eat more often. Dont lie down right after you eat. Wait a few hours for your stomach to empty. Avoid tight belts and tight-fitting clothes. Lose excess weight.  Tobacco and Alcohol Avoid smoking tobacco and drinking alcohol. They can make GERD symptoms worse.  Reducing Pollen Exposure The American Academy of Allergy, Asthma and Immunology suggests the following steps to reduce your exposure to pollen during  allergy seasons. Do not hang sheets or clothing out to dry; pollen may collect on these items. Do not mow lawns or spend time around freshly cut grass; mowing stirs up pollen. Keep windows closed at night.  Keep car windows closed while driving. Minimize morning activities outdoors, a time when pollen counts are usually at their highest. Stay indoors as much as possible when pollen counts or humidity is high and on  windy days when pollen tends to remain in the air longer. Use air conditioning when possible.  Many air conditioners have filters that trap the pollen spores. Use a HEPA room air filter to remove pollen form the indoor air you breathe.  Control of Mold Allergen Mold and fungi can grow on a variety of surfaces provided certain temperature and moisture conditions exist.  Outdoor molds grow on plants, decaying vegetation and soil.  The major outdoor mold, Alternaria and Cladosporium, are found in very high numbers during hot and dry conditions.  Generally, a late Summer - Fall peak is seen for common outdoor fungal spores.  Rain will temporarily lower outdoor mold spore count, but counts rise rapidly when the rainy period ends.  The most important indoor molds are Aspergillus and Penicillium.  Dark, humid and poorly ventilated basements are ideal sites for mold growth.  The next most common sites of mold growth are the bathroom and the kitchen.  Outdoor [employer] Use air conditioning and keep windows closed Avoid exposure to decaying vegetation. Avoid leaf raking. Avoid grain handling. Consider wearing a face mask if working in moldy areas.  Indoor Mold Control Maintain humidity below 50%. Clean washable surfaces with 5% bleach solution. Remove sources e.g. Contaminated carpets.   Control of Dust Mite Allergen Dust mites play a major role in allergic asthma and rhinitis. They occur in environments with high humidity wherever human skin is found. Dust mites absorb humidity from the atmosphere (ie, they do not drink) and feed on organic matter (including shed human and animal skin). Dust mites are a microscopic type of insect that you cannot see with the naked eye. High levels of dust mites have been detected from mattresses, pillows, carpets, upholstered furniture, bed covers, clothes, soft toys and any woven material. The principal allergen of the dust mite is found in its feces. A gram of dust  may contain 1,000 mites and 250,000 fecal particles. Mite antigen is easily measured in the air during house cleaning activities. Dust mites do not bite and do not cause harm to humans, other than by triggering allergies/asthma.  Ways to decrease your exposure to dust mites in your home:  1. Encase mattresses, box springs and pillows with a mite-impermeable barrier or cover  2. Wash sheets, blankets and drapes weekly in hot water (130 F) with detergent and dry them in a dryer on the hot setting.  3. Have the room cleaned frequently with a vacuum cleaner and a damp dust-mop. For carpeting or rugs, vacuuming with a vacuum cleaner equipped with a high-efficiency particulate air (HEPA) filter. The dust mite allergic individual should not be in a room which is being cleaned and should wait 1 hour after cleaning before going into the room.  4. Do not sleep on upholstered furniture (eg, couches).  5. If possible removing carpeting, upholstered furniture and drapery from the home is ideal. Horizontal blinds should be eliminated in the rooms where the person spends the most time (bedroom, study, television room). Washable vinyl, roller-type shades are optimal.  6. Remove all  non-washable stuffed toys from the bedroom. Wash stuffed toys weekly like sheets and blankets above.  7. Reduce indoor humidity to less than 50%. Inexpensive humidity monitors can be purchased at most hardware stores. Do not use a humidifier as can make the problem worse and are not recommended.  Control of Dog or Cat Allergen Avoidance is the best way to manage a dog or cat allergy. If you have a dog or cat and are allergic to dog or cats, consider removing the dog or cat from the home. If you have a dog or cat but dont want to find it a new home, or if your family wants a pet even though someone in the household is allergic, here are some strategies that may help keep symptoms at bay:  Keep the pet out of your bedroom and  restrict it to only a few rooms. Be advised that keeping the dog or cat in only one room will not limit the allergens to that room. Dont pet, hug or kiss the dog or cat; if you do, wash your hands with soap and water. High-efficiency particulate air (HEPA) cleaners run continuously in a bedroom or living room can reduce allergen levels over time. Regular use of a high-efficiency vacuum cleaner or a central vacuum can reduce allergen levels. Giving your dog or cat a bath at least once a week can reduce airborne allergen.

## 2021-06-01 ENCOUNTER — Ambulatory Visit (INDEPENDENT_AMBULATORY_CARE_PROVIDER_SITE_OTHER): Payer: BC Managed Care – PPO | Admitting: Family Medicine

## 2021-06-01 ENCOUNTER — Telehealth: Payer: Self-pay | Admitting: Family Medicine

## 2021-06-01 ENCOUNTER — Encounter: Payer: Self-pay | Admitting: Family Medicine

## 2021-06-01 ENCOUNTER — Other Ambulatory Visit: Payer: Self-pay

## 2021-06-01 VITALS — BP 128/68 | HR 99 | Temp 97.9°F | Resp 17 | Ht 65.0 in | Wt 150.6 lb

## 2021-06-01 DIAGNOSIS — J309 Allergic rhinitis, unspecified: Secondary | ICD-10-CM | POA: Diagnosis not present

## 2021-06-01 DIAGNOSIS — J4540 Moderate persistent asthma, uncomplicated: Secondary | ICD-10-CM

## 2021-06-01 DIAGNOSIS — J3089 Other allergic rhinitis: Secondary | ICD-10-CM | POA: Diagnosis not present

## 2021-06-01 DIAGNOSIS — J302 Other seasonal allergic rhinitis: Secondary | ICD-10-CM

## 2021-06-01 DIAGNOSIS — K219 Gastro-esophageal reflux disease without esophagitis: Secondary | ICD-10-CM

## 2021-06-01 DIAGNOSIS — H6983 Other specified disorders of Eustachian tube, bilateral: Secondary | ICD-10-CM

## 2021-06-01 MED ORDER — BUDESONIDE-FORMOTEROL FUMARATE 160-4.5 MCG/ACT IN AERO: 2.0000 | INHALATION_SPRAY | Freq: Two times a day (BID) | RESPIRATORY_TRACT | 5 refills | Status: DC

## 2021-06-01 MED ORDER — IPRATROPIUM BROMIDE 0.03 % NA SOLN
NASAL | 1 refills | Status: DC
Start: 2021-06-01 — End: 2022-03-15

## 2021-06-01 MED ORDER — AZELASTINE-FLUTICASONE 137-50 MCG/ACT NA SUSP: NASAL | 1 refills | Status: DC

## 2021-06-01 MED ORDER — OMEPRAZOLE 40 MG PO CPDR: 40.0000 mg | DELAYED_RELEASE_CAPSULE | Freq: Every day | ORAL | 5 refills | Status: DC

## 2021-06-01 MED ORDER — ALBUTEROL SULFATE HFA 108 (90 BASE) MCG/ACT IN AERS: 2.0000 | INHALATION_SPRAY | RESPIRATORY_TRACT | 1 refills | Status: DC | PRN

## 2021-06-01 MED ORDER — MONTELUKAST SODIUM 10 MG PO TABS: 10.0000 mg | ORAL_TABLET | Freq: Every day | ORAL | 1 refills | Status: DC

## 2021-06-01 NOTE — Telephone Encounter (Signed)
Patient reports that she has taken omeprazole in the past with no adverse reaction.  She does not recall itching after taking meloxicam.  She is willing to take omeprazole and will call the clinic with any adverse reaction including itching.

## 2021-06-08 ENCOUNTER — Ambulatory Visit (INDEPENDENT_AMBULATORY_CARE_PROVIDER_SITE_OTHER): Payer: BC Managed Care – PPO

## 2021-06-08 DIAGNOSIS — J309 Allergic rhinitis, unspecified: Secondary | ICD-10-CM

## 2021-06-13 ENCOUNTER — Encounter: Payer: Self-pay | Admitting: Family Medicine

## 2021-06-14 ENCOUNTER — Other Ambulatory Visit: Payer: Self-pay

## 2021-06-14 MED ORDER — PREDNISONE 10 MG PO TABS: ORAL_TABLET | ORAL | 0 refills | Status: DC

## 2021-06-14 MED ORDER — AMOXICILLIN-POT CLAVULANATE 875-125 MG PO TABS: 1.0000 | ORAL_TABLET | Freq: Two times a day (BID) | ORAL | 0 refills | Status: DC

## 2021-06-14 NOTE — Telephone Encounter (Signed)
Can you please call in Augmentin 875 mg twice a day for 10 days and Prednisone 10 mg tablets. Take 2 tablets once a day for 4 days, then take 1 tablet on the 5th day, then stop.  Please have her follow up in the clinic in 1-2 months. Have her call with any worsening symptoms or any questions. Thank you

## 2021-06-14 NOTE — Telephone Encounter (Signed)
Can you please ask if she has had any fever? Is the ear pain in both ears? Constant or intermittent? Is the cough producing any phlegm? Is the reflux medication helping with asthma symptoms? How often is she using Symbicort? Nasal saline rinses and steroid nasal spray? Thank you

## 2021-07-01 ENCOUNTER — Ambulatory Visit (INDEPENDENT_AMBULATORY_CARE_PROVIDER_SITE_OTHER): Payer: BC Managed Care – PPO

## 2021-07-01 DIAGNOSIS — J309 Allergic rhinitis, unspecified: Secondary | ICD-10-CM

## 2021-07-06 ENCOUNTER — Ambulatory Visit (INDEPENDENT_AMBULATORY_CARE_PROVIDER_SITE_OTHER): Payer: BC Managed Care – PPO

## 2021-07-06 DIAGNOSIS — J309 Allergic rhinitis, unspecified: Secondary | ICD-10-CM | POA: Diagnosis not present

## 2021-07-07 IMAGING — CR DG ABDOMEN 2V
2 series · 2 of 2 positions shown · non-contrast
Comparison: None.

CLINICAL DATA: Constipation

EXAM:
ABDOMEN - 2 VIEW

[w abdomen upright *]
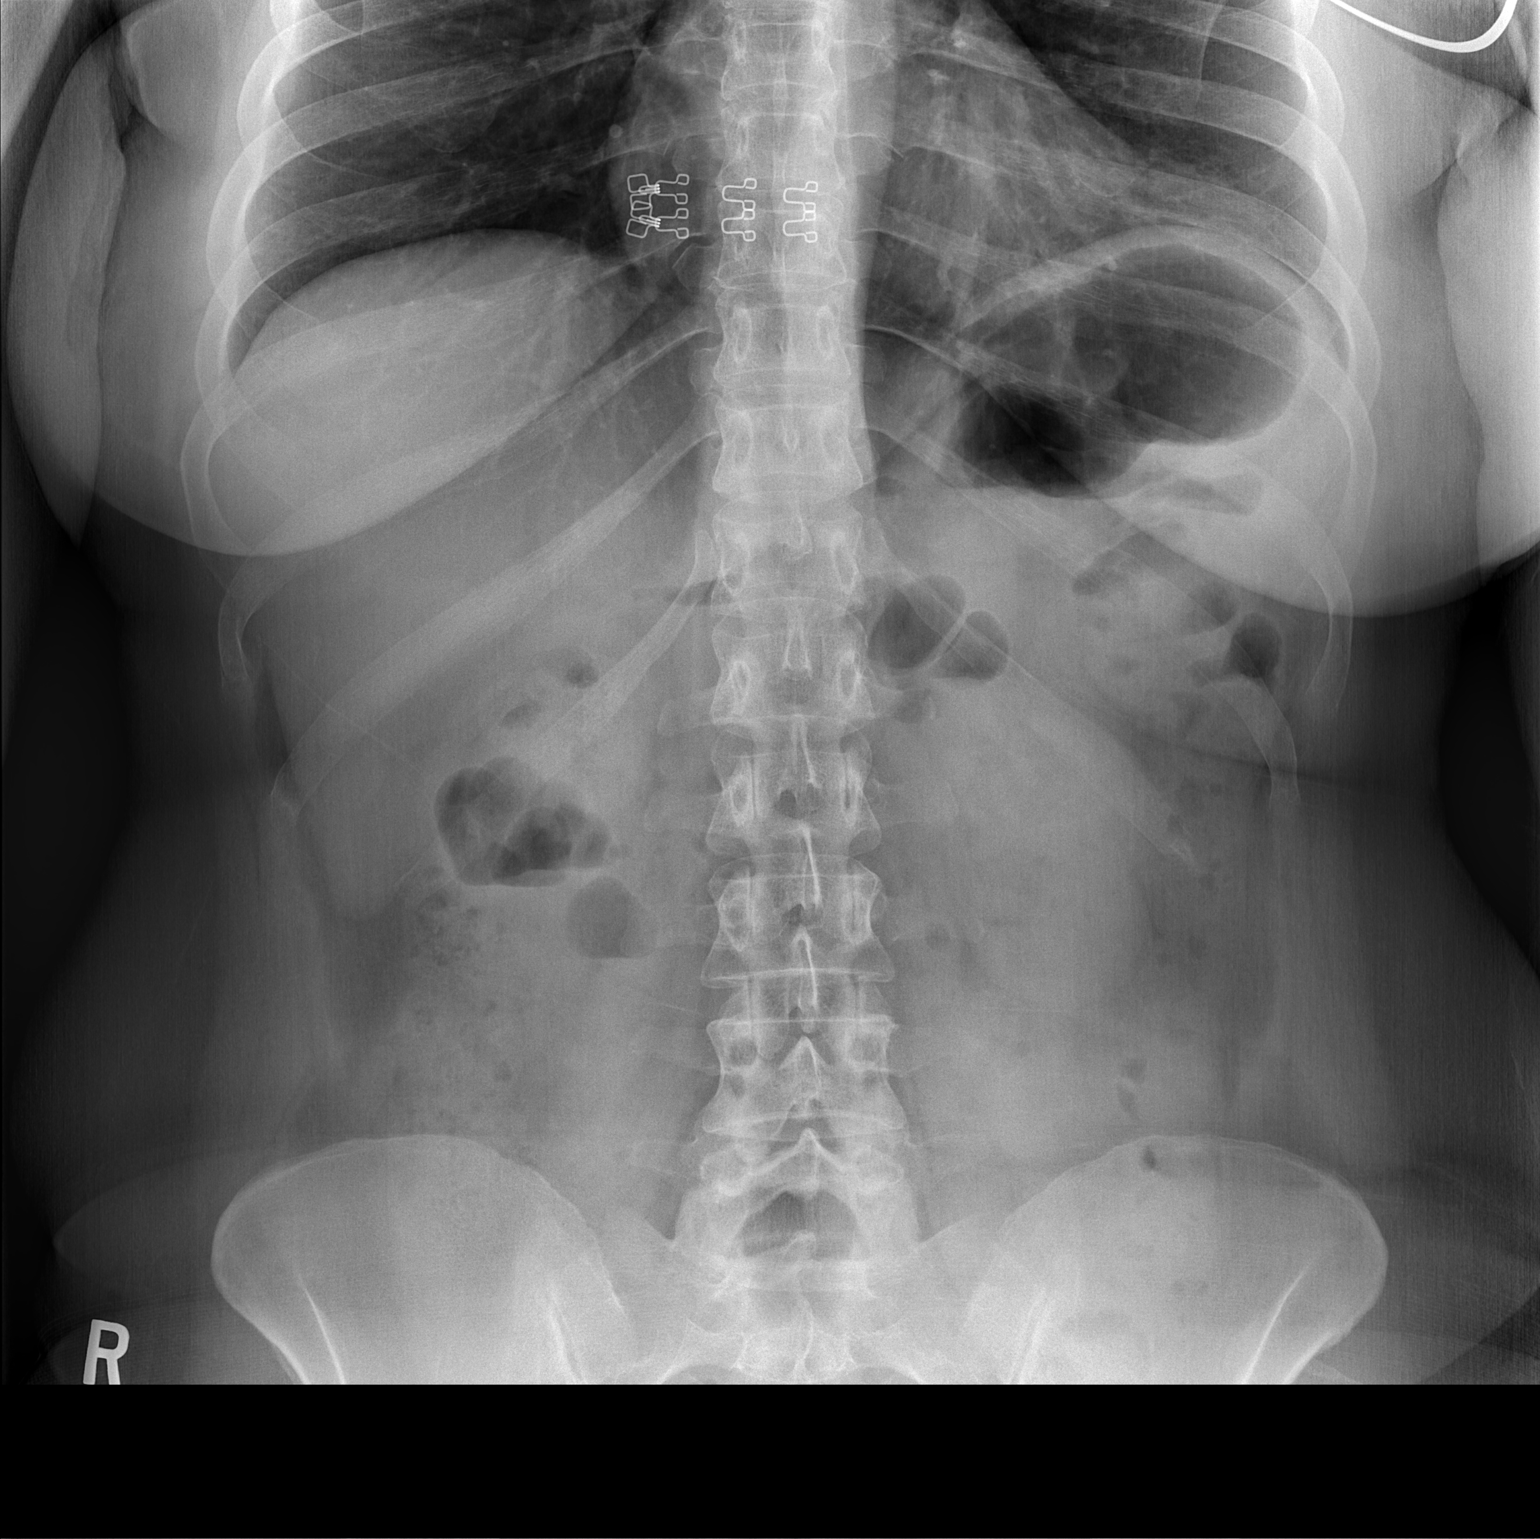

[t abdomen supine]
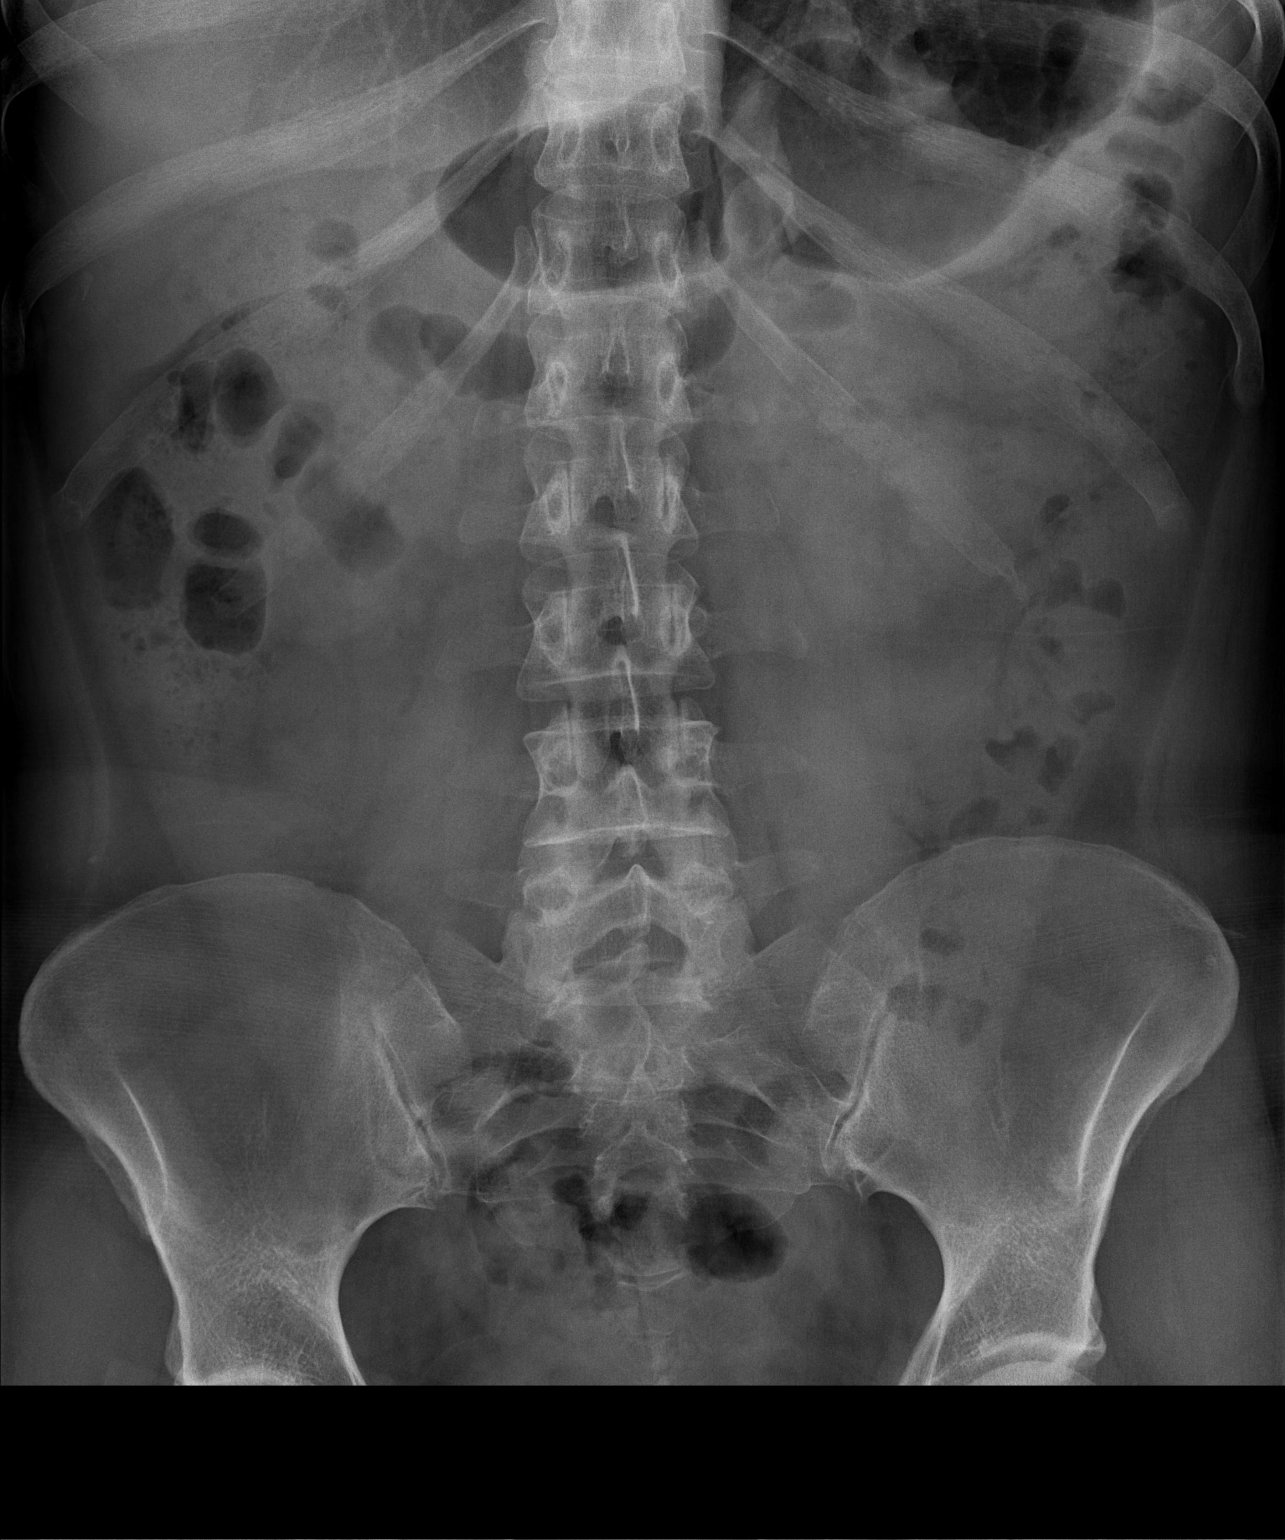

[2 of 2 positions shown; findings below may reference images not displayed]

FINDINGS: Normal bowel gas pattern. No significant stool burden. No radiopaque
calculi. Osseous structures are unremarkable.
IMPRESSION: Negative.

## 2021-07-10 ENCOUNTER — Other Ambulatory Visit: Payer: Self-pay | Admitting: Family Medicine

## 2021-07-12 ENCOUNTER — Ambulatory Visit (INDEPENDENT_AMBULATORY_CARE_PROVIDER_SITE_OTHER): Payer: BC Managed Care – PPO

## 2021-07-12 DIAGNOSIS — J309 Allergic rhinitis, unspecified: Secondary | ICD-10-CM | POA: Diagnosis not present

## 2021-07-22 ENCOUNTER — Ambulatory Visit (INDEPENDENT_AMBULATORY_CARE_PROVIDER_SITE_OTHER): Payer: BC Managed Care – PPO

## 2021-07-22 DIAGNOSIS — J309 Allergic rhinitis, unspecified: Secondary | ICD-10-CM | POA: Diagnosis not present

## 2021-08-01 ENCOUNTER — Ambulatory Visit (INDEPENDENT_AMBULATORY_CARE_PROVIDER_SITE_OTHER): Payer: BC Managed Care – PPO

## 2021-08-01 DIAGNOSIS — J309 Allergic rhinitis, unspecified: Secondary | ICD-10-CM | POA: Diagnosis not present

## 2021-08-10 ENCOUNTER — Ambulatory Visit (INDEPENDENT_AMBULATORY_CARE_PROVIDER_SITE_OTHER): Payer: BC Managed Care – PPO

## 2021-08-10 DIAGNOSIS — J309 Allergic rhinitis, unspecified: Secondary | ICD-10-CM | POA: Diagnosis not present

## 2021-08-17 ENCOUNTER — Ambulatory Visit (INDEPENDENT_AMBULATORY_CARE_PROVIDER_SITE_OTHER): Payer: BC Managed Care – PPO

## 2021-08-17 DIAGNOSIS — J309 Allergic rhinitis, unspecified: Secondary | ICD-10-CM

## 2021-09-08 ENCOUNTER — Ambulatory Visit (INDEPENDENT_AMBULATORY_CARE_PROVIDER_SITE_OTHER): Payer: BC Managed Care – PPO

## 2021-09-08 DIAGNOSIS — J309 Allergic rhinitis, unspecified: Secondary | ICD-10-CM | POA: Diagnosis not present

## 2021-09-14 ENCOUNTER — Ambulatory Visit (INDEPENDENT_AMBULATORY_CARE_PROVIDER_SITE_OTHER): Payer: BC Managed Care – PPO

## 2021-09-14 DIAGNOSIS — J309 Allergic rhinitis, unspecified: Secondary | ICD-10-CM | POA: Diagnosis not present

## 2021-09-21 DIAGNOSIS — J3089 Other allergic rhinitis: Secondary | ICD-10-CM | POA: Diagnosis not present

## 2021-09-21 NOTE — Progress Notes (Signed)
EXP 09/22/22 ?

## 2021-09-23 ENCOUNTER — Ambulatory Visit (INDEPENDENT_AMBULATORY_CARE_PROVIDER_SITE_OTHER): Payer: BC Managed Care – PPO | Admitting: *Deleted

## 2021-09-23 DIAGNOSIS — J309 Allergic rhinitis, unspecified: Secondary | ICD-10-CM

## 2021-09-28 ENCOUNTER — Ambulatory Visit (INDEPENDENT_AMBULATORY_CARE_PROVIDER_SITE_OTHER): Payer: BC Managed Care – PPO

## 2021-09-28 DIAGNOSIS — J309 Allergic rhinitis, unspecified: Secondary | ICD-10-CM

## 2021-10-14 ENCOUNTER — Ambulatory Visit (INDEPENDENT_AMBULATORY_CARE_PROVIDER_SITE_OTHER): Payer: BC Managed Care – PPO

## 2021-10-14 DIAGNOSIS — J309 Allergic rhinitis, unspecified: Secondary | ICD-10-CM

## 2021-10-25 ENCOUNTER — Ambulatory Visit (INDEPENDENT_AMBULATORY_CARE_PROVIDER_SITE_OTHER): Payer: BC Managed Care – PPO

## 2021-10-25 DIAGNOSIS — J309 Allergic rhinitis, unspecified: Secondary | ICD-10-CM

## 2021-10-30 ENCOUNTER — Other Ambulatory Visit: Payer: Self-pay | Admitting: Family Medicine

## 2021-11-10 ENCOUNTER — Ambulatory Visit (INDEPENDENT_AMBULATORY_CARE_PROVIDER_SITE_OTHER): Payer: BC Managed Care – PPO

## 2021-11-10 DIAGNOSIS — J309 Allergic rhinitis, unspecified: Secondary | ICD-10-CM | POA: Diagnosis not present

## 2021-11-25 ENCOUNTER — Ambulatory Visit (INDEPENDENT_AMBULATORY_CARE_PROVIDER_SITE_OTHER): Payer: BC Managed Care – PPO

## 2021-11-25 DIAGNOSIS — J309 Allergic rhinitis, unspecified: Secondary | ICD-10-CM | POA: Diagnosis not present

## 2021-12-09 ENCOUNTER — Ambulatory Visit (INDEPENDENT_AMBULATORY_CARE_PROVIDER_SITE_OTHER): Payer: BC Managed Care – PPO

## 2021-12-09 DIAGNOSIS — J309 Allergic rhinitis, unspecified: Secondary | ICD-10-CM | POA: Diagnosis not present

## 2021-12-21 ENCOUNTER — Ambulatory Visit (INDEPENDENT_AMBULATORY_CARE_PROVIDER_SITE_OTHER): Payer: BC Managed Care – PPO

## 2021-12-21 DIAGNOSIS — J309 Allergic rhinitis, unspecified: Secondary | ICD-10-CM

## 2021-12-28 ENCOUNTER — Ambulatory Visit (INDEPENDENT_AMBULATORY_CARE_PROVIDER_SITE_OTHER): Payer: BC Managed Care – PPO

## 2021-12-28 DIAGNOSIS — J309 Allergic rhinitis, unspecified: Secondary | ICD-10-CM

## 2022-01-02 ENCOUNTER — Ambulatory Visit (INDEPENDENT_AMBULATORY_CARE_PROVIDER_SITE_OTHER): Payer: BC Managed Care – PPO

## 2022-01-02 DIAGNOSIS — J309 Allergic rhinitis, unspecified: Secondary | ICD-10-CM | POA: Diagnosis not present

## 2022-01-18 ENCOUNTER — Ambulatory Visit (INDEPENDENT_AMBULATORY_CARE_PROVIDER_SITE_OTHER): Payer: BC Managed Care – PPO

## 2022-01-18 DIAGNOSIS — J309 Allergic rhinitis, unspecified: Secondary | ICD-10-CM

## 2022-01-26 NOTE — Progress Notes (Signed)
VIALS EXP 01-27-23 

## 2022-01-27 DIAGNOSIS — J3089 Other allergic rhinitis: Secondary | ICD-10-CM | POA: Diagnosis not present

## 2022-02-01 ENCOUNTER — Ambulatory Visit (INDEPENDENT_AMBULATORY_CARE_PROVIDER_SITE_OTHER): Payer: BC Managed Care – PPO

## 2022-02-01 DIAGNOSIS — J309 Allergic rhinitis, unspecified: Secondary | ICD-10-CM | POA: Diagnosis not present

## 2022-02-16 ENCOUNTER — Ambulatory Visit (INDEPENDENT_AMBULATORY_CARE_PROVIDER_SITE_OTHER): Payer: BC Managed Care – PPO

## 2022-02-16 DIAGNOSIS — J309 Allergic rhinitis, unspecified: Secondary | ICD-10-CM

## 2022-03-02 ENCOUNTER — Ambulatory Visit (INDEPENDENT_AMBULATORY_CARE_PROVIDER_SITE_OTHER): Payer: BC Managed Care – PPO

## 2022-03-02 DIAGNOSIS — J309 Allergic rhinitis, unspecified: Secondary | ICD-10-CM

## 2022-03-15 ENCOUNTER — Ambulatory Visit (INDEPENDENT_AMBULATORY_CARE_PROVIDER_SITE_OTHER): Payer: BC Managed Care – PPO | Admitting: Internal Medicine

## 2022-03-15 ENCOUNTER — Encounter: Payer: Self-pay | Admitting: Internal Medicine

## 2022-03-15 ENCOUNTER — Encounter: Payer: Self-pay | Admitting: Family Medicine

## 2022-03-15 VITALS — BP 122/78 | HR 24 | Temp 97.9°F | Resp 24 | Wt 150.9 lb

## 2022-03-15 DIAGNOSIS — J3089 Other allergic rhinitis: Secondary | ICD-10-CM | POA: Diagnosis not present

## 2022-03-15 DIAGNOSIS — J069 Acute upper respiratory infection, unspecified: Secondary | ICD-10-CM | POA: Diagnosis not present

## 2022-03-15 DIAGNOSIS — J4541 Moderate persistent asthma with (acute) exacerbation: Secondary | ICD-10-CM | POA: Diagnosis not present

## 2022-03-15 DIAGNOSIS — J302 Other seasonal allergic rhinitis: Secondary | ICD-10-CM

## 2022-03-15 MED ORDER — BUDESONIDE-FORMOTEROL FUMARATE 80-4.5 MCG/ACT IN AERO: 2.0000 | INHALATION_SPRAY | Freq: Two times a day (BID) | RESPIRATORY_TRACT | 5 refills | Status: DC

## 2022-03-15 MED ORDER — AZELASTINE-FLUTICASONE 137-50 MCG/ACT NA SUSP: NASAL | 3 refills | Status: DC

## 2022-03-15 MED ORDER — MONTELUKAST SODIUM 10 MG PO TABS: 10.0000 mg | ORAL_TABLET | Freq: Every day | ORAL | 1 refills | Status: DC

## 2022-03-15 MED ORDER — PREDNISONE 10 MG PO TABS: ORAL_TABLET | ORAL | 0 refills | Status: AC

## 2022-03-15 MED ORDER — IPRATROPIUM BROMIDE 0.03 % NA SOLN: NASAL | 1 refills | Status: DC

## 2022-03-15 NOTE — Telephone Encounter (Signed)
Thank you :)

## 2022-03-15 NOTE — Patient Instructions (Addendum)
Asthma: - Start Prednisone 20mg  twice daily for 3 days, 10mg  twice daily for 3 days, 10mg  daily for 3 days and then stop.  - Maintenance inhaler: Restart Symbicort 5mcg 2 puffs twice daily. Take this no matter what. Continue Singulair 10mg  daily. - Rescue inhaler: Albuterol 2 puffs via spacer every 4-6 hours as needed for respiratory symptoms of cough, shortness of breath, or wheezing.   **Once you get over this illness, switch to SMART therapy. -You will use Symbicort 2 puffs twice daily no matter what and 1-2 puffs as needed for wheezing/coughing/shortness of breath.  No need to carry albuterol around. -You can do a maximum of 12 puffs total in a day.   -Keep track of how often you are needing this Symbicort.   Upper Respiratory Infection/Allergies: - Finish the course of antibiotics.  - Use nasal saline rinses before nose sprays such as with Neilmed Sinus Rinse.  Use distilled water.   - Use Flonase-Azelastine 1 sprays each nostril twice daily. Aim upward and outward. - Use Ipratroprium 1-2 sprays up to four times daily as needed for runny nose. Aim upward and outward. - Use Zyrtec 10 mg daily as needed. - Skip your upcoming allergy shot until your breathing is better from the steroids.  It should get better this week so you should be able to get it next week.

## 2022-03-15 NOTE — Progress Notes (Signed)
FOLLOW UP Date of Service/Encounter:  03/15/22   Subjective:  Bonnie Lowe (DOB: 03/24/1971) is a 51 y.o. female who returns to the [facility] on 03/15/2022 for follow up for an acute visit with sinus problems.   History obtained from: chart review and patient.  She was last seen 05/31/2021, for moderate persistent asthma and allergic rhinitis.  At that time she was having increased symptoms of coughing and shortness of breath so was told to continue her Symbicort 160 mcg 2 puffs twice daily, Singulair 10 mg daily with as needed albuterol.  She reports doing well since that visit without much shortness of breath, chest tightness, coughing so she stopped using her Symbicort and Singulair.  She has been off of this for months and doing well.  However about a week ago, she developed congestion, increased drainage, ears blockage and pain, and sore throat.  This was followed by development of cough, chest tightness, shortness of breath and wheezing.  No significant facial pain or pus drainage or fevers. She has since restarted her Symbicort 160 mcg 2 puffs twice a day and is using her albuterol twice daily without much improvement.  She did go to an urgent care in [place] and was given amoxicillin which she is currently on a 4 out of 10-day course and a steroid shot.  This has not improved her symptoms much.    Past Medical History: Past Medical History:  Diagnosis Date   Angio-edema    Asthma    Hypertension     Objective:  BP 122/78 (BP Location: Left Arm, Patient Position: Sitting, Cuff Size: Normal)   Pulse (!) 24   Temp 97.9 F (36.6 C) (Temporal)   Resp (!) 24   Wt 150 lb 14.4 oz (68.4 kg)   SpO2 97%   BMI 25.11 kg/m  Body mass index is 25.11 kg/m. Physical Exam: GEN: alert, well developed HEENT: clear conjunctiva, TM slightly hazy and translucent but no erythema or bulging, nose with moderate inferior turbinate hypertrophy, pink boggy nasal mucosa, slight clear  rhinorrhea, + cobblestoning HEART: regular rate and rhythm, no murmur LUNGS: bl wheezing and coughing on forced exhalation, no crackles, unlabored on room air, finishing sentences SKIN: no rashes or lesions  Assessment/Plan   Moderate persistent asthma with acute exacerbation - Seems like she was previously well controlled without any medications but has now an acute flareup likely due to an upper respiratory infection.  She was a little confused about which inhalers she was supposed to take and how often.  Might benefit from smart therapy so after this exacerbation or whenever she finishes her new albuterol inhaler (so that it does not go to waste), will transition to SMART. - Start Prednisone 20mg  twice daily for 3 days, 10mg  twice daily for 3 days, 10mg  daily for 3 days and then stop.  - Maintenance inhaler: Restart Symbicort 18mcg 2 puffs twice daily. Take this no matter what. Continue Singulair 10mg  daily. - Rescue inhaler: Albuterol 2 puffs via spacer every 4-6 hours as needed for respiratory symptoms of cough, shortness of breath, or wheezing.   **Once you get over this illness, switch to SMART therapy. -You will use Symbicort 2 puffs twice daily no matter what and 1-2 puffs as needed for wheezing/coughing/shortness of breath.  No need to carry albuterol around. -You can do a maximum of 12 puffs total in a day.   -Keep track of how often you are needing this Symbicort.  If no frequent use,  might be able to deescalate Symbicort to once a day or PRN use.   - Will need spirometry at next visit.  Upper Respiratory Infection Allergic Rhinoconjunctivitis  - Okay to finish the course of antibiotics.  Symptoms do sound like viral URI though.  - Use nasal saline rinses before nose sprays such as with Neilmed Sinus Rinse.  Use distilled water.   - Use Flonase-Azelastine 1 sprays each nostril twice daily. Aim upward and outward. - Use Ipratroprium 1-2 sprays up to four times daily as needed for  runny nose. Aim upward and outward. - Use Zyrtec 10 mg daily as needed. - Skip your upcoming allergy shot until your breathing is better from the steroids.  It should get better this week so you should be able to get it next week.    Return in about 8 weeks (around 05/10/2022). Alesia Morin, MD  [facility]

## 2022-03-15 NOTE — Telephone Encounter (Signed)
Either one is fine with me. She can also see a different provider if she needs to be seen immediately. Thank you

## 2022-03-22 ENCOUNTER — Ambulatory Visit (INDEPENDENT_AMBULATORY_CARE_PROVIDER_SITE_OTHER): Payer: BC Managed Care – PPO

## 2022-03-22 DIAGNOSIS — J309 Allergic rhinitis, unspecified: Secondary | ICD-10-CM | POA: Diagnosis not present

## 2022-03-29 ENCOUNTER — Ambulatory Visit (INDEPENDENT_AMBULATORY_CARE_PROVIDER_SITE_OTHER): Payer: BC Managed Care – PPO

## 2022-03-29 DIAGNOSIS — J309 Allergic rhinitis, unspecified: Secondary | ICD-10-CM | POA: Diagnosis not present

## 2022-04-14 ENCOUNTER — Ambulatory Visit (INDEPENDENT_AMBULATORY_CARE_PROVIDER_SITE_OTHER): Payer: BC Managed Care – PPO

## 2022-04-14 DIAGNOSIS — J309 Allergic rhinitis, unspecified: Secondary | ICD-10-CM

## 2022-04-19 ENCOUNTER — Ambulatory Visit (INDEPENDENT_AMBULATORY_CARE_PROVIDER_SITE_OTHER): Payer: BC Managed Care – PPO

## 2022-04-19 DIAGNOSIS — J309 Allergic rhinitis, unspecified: Secondary | ICD-10-CM | POA: Diagnosis not present

## 2022-04-23 ENCOUNTER — Other Ambulatory Visit: Payer: Self-pay | Admitting: Family Medicine

## 2022-04-23 ENCOUNTER — Other Ambulatory Visit: Payer: Self-pay | Admitting: Allergy

## 2022-04-25 ENCOUNTER — Other Ambulatory Visit (HOSPITAL_COMMUNITY): Payer: Self-pay

## 2022-04-26 ENCOUNTER — Encounter: Payer: Self-pay | Admitting: Internal Medicine

## 2022-04-26 ENCOUNTER — Ambulatory Visit (INDEPENDENT_AMBULATORY_CARE_PROVIDER_SITE_OTHER): Payer: BC Managed Care – PPO | Admitting: Internal Medicine

## 2022-04-26 VITALS — BP 112/72 | HR 84 | Temp 98.1°F | Resp 20 | Ht 65.0 in | Wt 148.1 lb

## 2022-04-26 DIAGNOSIS — R058 Other specified cough: Secondary | ICD-10-CM | POA: Diagnosis not present

## 2022-04-26 DIAGNOSIS — J3089 Other allergic rhinitis: Secondary | ICD-10-CM

## 2022-04-26 DIAGNOSIS — J302 Other seasonal allergic rhinitis: Secondary | ICD-10-CM

## 2022-04-26 DIAGNOSIS — J4540 Moderate persistent asthma, uncomplicated: Secondary | ICD-10-CM | POA: Diagnosis not present

## 2022-04-26 DIAGNOSIS — H1013 Acute atopic conjunctivitis, bilateral: Secondary | ICD-10-CM

## 2022-04-26 MED ORDER — MONTELUKAST SODIUM 10 MG PO TABS: 10.0000 mg | ORAL_TABLET | Freq: Every day | ORAL | 1 refills | Status: DC

## 2022-04-26 MED ORDER — EPINEPHRINE 0.3 MG/0.3ML IJ SOAJ: 0.3000 mg | INTRAMUSCULAR | 1 refills | Status: DC | PRN

## 2022-04-26 MED ORDER — ALBUTEROL SULFATE HFA 108 (90 BASE) MCG/ACT IN AERS: INHALATION_SPRAY | RESPIRATORY_TRACT | 1 refills | Status: DC

## 2022-04-26 MED ORDER — AZELASTINE-FLUTICASONE 137-50 MCG/ACT NA SUSP: NASAL | 3 refills | Status: DC

## 2022-04-26 MED ORDER — SPIRIVA RESPIMAT 1.25 MCG/ACT IN AERS: 2.0000 | INHALATION_SPRAY | Freq: Every day | RESPIRATORY_TRACT | 3 refills | Status: DC

## 2022-04-26 MED ORDER — CETIRIZINE HCL 10 MG PO TABS: 10.0000 mg | ORAL_TABLET | Freq: Every day | ORAL | 3 refills | Status: DC

## 2022-04-26 MED ORDER — BENZONATATE 200 MG PO CAPS: 200.0000 mg | ORAL_CAPSULE | Freq: Two times a day (BID) | ORAL | 0 refills | Status: DC | PRN

## 2022-04-26 MED ORDER — BUDESONIDE-FORMOTEROL FUMARATE 160-4.5 MCG/ACT IN AERO: 2.0000 | INHALATION_SPRAY | Freq: Two times a day (BID) | RESPIRATORY_TRACT | 3 refills | Status: DC

## 2022-04-26 MED ORDER — IPRATROPIUM BROMIDE 0.03 % NA SOLN: NASAL | 3 refills | Status: DC

## 2022-04-26 NOTE — Progress Notes (Signed)
   FOLLOW UP Date of Service/Encounter:  04/26/22   Subjective:  Bonnie Lowe (DOB: 1971/04/20) is a 51 y.o. female [facility] for follow up an acute visit for cough.    History obtained from: chart review and patient. She was last seen 03/15/2022 by me for asthma exacerbation with URI.  Given course of prednisone at the time.  Attempted initiation of SMART therapy also.   Since last visit, she reports having a cough.  It has been worse in the past few weeks.  Mostly a dry cough but does sometimes cough up mucous.  She is also having a lot of post nasal drainage and runny nose.  She also has some pressure and pain in her ears.  No fevers/chills.  Taking Symbicort 2 puffs BID, Albuterol BID, Spiriva every other day, PRN motelukast, PRN saline, Ipratroprium 2 SEN BID and not taking Azelastine/Flonase.  She has not switched to SMART therapy.  Past Medical History: Past Medical History:  Diagnosis Date   Angio-edema    Asthma    Hypertension     Objective:  BP 112/72 (BP Location: Left Arm, Patient Position: Sitting, Cuff Size: Normal)   Pulse 84   Temp 98.1 F (36.7 C) (Temporal)   Ht 5\' 5"  (1.651 m)   Wt 148 lb 1.6 oz (67.2 kg)   SpO2 (!) 20%   BMI 24.65 kg/m  Body mass index is 24.65 kg/m. Physical Exam: GEN: alert, well developed HEENT: clear conjunctiva, TM grey and translucent, nose with moderate inferior turbinate hypertrophy, pink boggy nasal mucosa, slight clear rhinorrhea, + cobblestoning HEART: regular rate and rhythm, no murmur LUNGS: no wheezing or crackles or rhonchi, unlabored on RA SKIN: no rashes or lesions  Spirometry:  Tracings reviewed. Her effort: Good reproducible efforts. FVC: 3.74L FEV1: 2.81L, 102% predicted FEV1/FVC ratio: 75% Interpretation: Spirometry consistent with normal pattern.  Please see scanned spirometry results for details.   Assessment/Plan  Moderate Persistent Asthma Post  Infectious Cough - Low suspicion for asthma exacerbation resulting in the cough, normal spirometry with normal lung exam today.  Likely a post infectious cough. Will also switch to traditional regimen instead of SMART as there has been some confusion. - Maintenance inhaler:  - Continue Symbicort 2 puffs twice daily.  - Restart Spiriva 2 puffs daily. - Restart Singulair 10mg  daily. - Rescue inhaler: Albuterol 2 puffs via spacer every 4-6 hours as needed for respiratory symptoms of shortness of breath or wheezing.  - Can try tessalon perles as needed to help with the cough.  - Recommend symptomatic care at home with warm honey tea and cough drops.    Allergic Rhinitis - Use nasal saline rinses before nose sprays such as with Neilmed Sinus Rinse.  Use distilled water.   - Use Flonase-Azelastine 1 sprays each nostril twice daily. Aim upward and outward. - Use Ipratroprium 2 sprays up to four times daily as needed for runny nose. Aim upward and outward. - Use Zyrtec 10mg  daily.  - Use Mucinex as needed. - Can try Hyland's earache drops for ear itching, pain, pressure.  - Continue your allergy shots.     Keep f/u in December.  , MD  [facility]

## 2022-04-26 NOTE — Patient Instructions (Addendum)
Asthma Post Infectious Cough - Maintenance inhaler:  - Continue Symbicort 2 puffs twice daily.  - Restart Spiriva 2 puffs daily. - Restart Singulair 10mg  daily. - Rescue inhaler: Albuterol 2 puffs via spacer every 4-6 hours as needed for respiratory symptoms of shortness of breath or wheezing.  - Can try tessalon perles as needed to help with the cough.  - Recommend symptomatic care at home with warm honey tea and cough drops.    Allergic Rhinitis - Use nasal saline rinses before nose sprays such as with Neilmed Sinus Rinse.  Use distilled water.   - Use Flonase-Azelastine 1 sprays each nostril twice daily. Aim upward and outward. - Use Ipratroprium 2 sprays up to four times daily as needed for runny nose. Aim upward and outward. - Use Zyrtec 10mg  daily.  - Use Mucinex as needed. - Can try Hyland's earache drops for ear itching, pain, pressure.  - Continue your allergy shots.    Keep appointment in December.

## 2022-04-28 ENCOUNTER — Ambulatory Visit (INDEPENDENT_AMBULATORY_CARE_PROVIDER_SITE_OTHER): Payer: BC Managed Care – PPO

## 2022-04-28 DIAGNOSIS — J309 Allergic rhinitis, unspecified: Secondary | ICD-10-CM

## 2022-05-02 ENCOUNTER — Ambulatory Visit (INDEPENDENT_AMBULATORY_CARE_PROVIDER_SITE_OTHER): Payer: BC Managed Care – PPO

## 2022-05-02 DIAGNOSIS — J309 Allergic rhinitis, unspecified: Secondary | ICD-10-CM

## 2022-05-22 NOTE — Patient Instructions (Signed)
Asthma Continue montelukast 10 mg once a day to prevent cough or wheeze Continue Symbicort 160-2 puffs twice a day with a spacer to prevent cough or wheeze Continue Spiriva 1.25 mcg 2 puffs once a day to prevent cough or wheeze Continue albuterol 2 puffs every 4 hours as needed for cough or wheeze OR Instead use albuterol 0.083% solution via nebulizer one unit vial every 4 hours as needed for cough or wheeze Consider Xolair injections if needed for asthma control  Cough Continue Tessalon Perles  Allergic rhinitis Continue allergen avoidance measures directed toward mold, tree pollen, tree pollen, dust mite, and dog as listed below Continue allergen immunotherapy and have access to an epinephrine autoinjector set per protocol Continue azelastine/fluticasone nasal spray 2 sprays in each nostril up to twice a day as needed for nasal symptoms Continue ipratropium 2 sprays in each nostril up to twice a day as needed for a runny nose Continue cetirizine 10 mg once a day as needed for runny nose Consider Mucinex 600 to 1200 mg twice a day as needed for nasal congestion Consider saline nasal rinses as needed for nasal symptoms. Use this before any medicated nasal sprays for best result  Reflux Continue dietary and lifestyle modifications as listed below  Call the clinic if this treatment plan is not working well for you.  Follow up in *** or sooner if needed.  Reducing Pollen Exposure The American Academy of Allergy, Asthma and Immunology suggests the following steps to reduce your exposure to pollen during allergy seasons. Do not hang sheets or clothing out to dry; pollen may collect on these items. Do not mow lawns or spend time around freshly cut grass; mowing stirs up pollen. Keep windows closed at night.  Keep car windows closed while driving. Minimize morning activities outdoors, a time when pollen counts are usually at their highest. Stay indoors as much as possible when pollen counts  or humidity is high and on windy days when pollen tends to remain in the air longer. Use air conditioning when possible.  Many air conditioners have filters that trap the pollen spores. Use a HEPA room air filter to remove pollen form the indoor air you breathe.  Control of Mold Allergen Mold and fungi can grow on a variety of surfaces provided certain temperature and moisture conditions exist.  Outdoor molds grow on plants, decaying vegetation and soil.  The major outdoor mold, Alternaria and Cladosporium, are found in very high numbers during hot and dry conditions.  Generally, a late Summer - Fall peak is seen for common outdoor fungal spores.  Rain will temporarily lower outdoor mold spore count, but counts rise rapidly when the rainy period ends.  The most important indoor molds are Aspergillus and Penicillium.  Dark, humid and poorly ventilated basements are ideal sites for mold growth.  The next most common sites of mold growth are the bathroom and the kitchen.  Outdoor [employer] Use air conditioning and keep windows closed Avoid exposure to decaying vegetation. Avoid leaf raking. Avoid grain handling. Consider wearing a face mask if working in moldy areas.  Indoor Mold Control Maintain humidity below 50%. Clean washable surfaces with 5% bleach solution. Remove sources e.g. Contaminated carpets.   Control of Dust Mite Allergen Dust mites play a major role in allergic asthma and rhinitis. They occur in environments with high humidity wherever human skin is found. Dust mites absorb humidity from the atmosphere (ie, they do not drink) and feed on organic matter (including shed human  and animal skin). Dust mites are a microscopic type of insect that you cannot see with the naked eye. High levels of dust mites have been detected from mattresses, pillows, carpets, upholstered furniture, bed covers, clothes, soft toys and any woven material. The principal allergen of the dust mite is found in  its feces. A gram of dust may contain 1,000 mites and 250,000 fecal particles. Mite antigen is easily measured in the air during house cleaning activities. Dust mites do not bite and do not cause harm to humans, other than by triggering allergies/asthma.  Ways to decrease your exposure to dust mites in your home:  1. Encase mattresses, box springs and pillows with a mite-impermeable barrier or cover  2. Wash sheets, blankets and drapes weekly in hot water (130 F) with detergent and dry them in a dryer on the hot setting.  3. Have the room cleaned frequently with a vacuum cleaner and a damp dust-mop. For carpeting or rugs, vacuuming with a vacuum cleaner equipped with a high-efficiency particulate air (HEPA) filter. The dust mite allergic individual should not be in a room which is being cleaned and should wait 1 hour after cleaning before going into the room.  4. Do not sleep on upholstered furniture (eg, couches).  5. If possible removing carpeting, upholstered furniture and drapery from the home is ideal. Horizontal blinds should be eliminated in the rooms where the person spends the most time (bedroom, study, television room). Washable vinyl, roller-type shades are optimal.  6. Remove all non-washable stuffed toys from the bedroom. Wash stuffed toys weekly like sheets and blankets above.  7. Reduce indoor humidity to less than 50%. Inexpensive humidity monitors can be purchased at most hardware stores. Do not use a humidifier as can make the problem worse and are not recommended.  Control of Dog or Cat Allergen Avoidance is the best way to manage a dog or cat allergy. If you have a dog or cat and are allergic to dog or cats, consider removing the dog or cat from the home. If you have a dog or cat but don't want to find it a new home, or if your family wants a pet even though someone in the household is allergic, here are some strategies that may help keep symptoms at bay:  Keep the pet out  of your bedroom and restrict it to only a few rooms. Be advised that keeping the dog or cat in only one room will not limit the allergens to that room. Don't pet, hug or kiss the dog or cat; if you do, wash your hands with soap and water. High-efficiency particulate air (HEPA) cleaners run continuously in a bedroom or living room can reduce allergen levels over time. Regular use of a high-efficiency vacuum cleaner or a central vacuum can reduce allergen levels. Giving your dog or cat a bath at least once a week can reduce airborne allergen.

## 2022-05-22 NOTE — Progress Notes (Unsigned)
   [address] [place] [address] Dept: [phone]  FOLLOW UP NOTE  Patient ID: Bonnie Lowe, female    DOB: 07-05-1970  Age: 51 y.o. MRN: [id number] Date of Office Visit: 05/23/2022  Assessment  Chief Complaint: No chief complaint on file.  HPI Bonnie Lowe is a 51 year old female who presents the clinic for follow-up visit.  She was last seen in this clinic on 04/26/2022 by Dr. Allena Katz for evaluation of asthma, cough, and allergic rhinitis on allergen immunotherapy directed toward mold, weed pollen, tree pollen, dust mite, and dog.  Chart review indicates that on 12/16/2020 she had total IgE 150 as well as a documented perennial allergy which indicates that she may qualify for Xolair injections.   Drug Allergies:  Allergies  Allergen Reactions   Moxifloxacin Hcl Itching   Cefdinir Hives and Diarrhea   Citrus Diarrhea   Erythromycin    Pineapple Other (See Comments)   Sulfa Antibiotics     Physical Exam: There were no vitals taken for this visit.   Physical Exam  Diagnostics:    Assessment and Plan: No diagnosis found.  No orders of the defined types were placed in this encounter.   There are no Patient Instructions on file for this visit.  No follow-ups on file.    Thank you for the opportunity to care for this patient.  Please do not hesitate to contact me with questions.  Thermon Leyland, FNP [facility]

## 2022-05-23 ENCOUNTER — Telehealth: Payer: Self-pay

## 2022-05-23 ENCOUNTER — Other Ambulatory Visit: Payer: Self-pay

## 2022-05-23 ENCOUNTER — Ambulatory Visit (HOSPITAL_BASED_OUTPATIENT_CLINIC_OR_DEPARTMENT_OTHER)
Admission: RE | Admit: 2022-05-23 | Discharge: 2022-05-23 | Disposition: A | Discharge status: Home / Self Care | Payer: BC Managed Care – PPO | Source: Ambulatory Visit | Attending: Family Medicine | Admitting: Family Medicine

## 2022-05-23 ENCOUNTER — Ambulatory Visit (INDEPENDENT_AMBULATORY_CARE_PROVIDER_SITE_OTHER): Payer: BC Managed Care – PPO | Admitting: Family Medicine

## 2022-05-23 ENCOUNTER — Ambulatory Visit: Payer: Self-pay

## 2022-05-23 ENCOUNTER — Encounter: Payer: Self-pay | Admitting: Family Medicine

## 2022-05-23 VITALS — BP 110/72 | HR 90 | Temp 98.0°F | Resp 12 | Wt 153.8 lb

## 2022-05-23 DIAGNOSIS — R052 Subacute cough: Secondary | ICD-10-CM

## 2022-05-23 DIAGNOSIS — J4540 Moderate persistent asthma, uncomplicated: Secondary | ICD-10-CM

## 2022-05-23 DIAGNOSIS — K219 Gastro-esophageal reflux disease without esophagitis: Secondary | ICD-10-CM | POA: Diagnosis not present

## 2022-05-23 DIAGNOSIS — J302 Other seasonal allergic rhinitis: Secondary | ICD-10-CM

## 2022-05-23 DIAGNOSIS — J309 Allergic rhinitis, unspecified: Secondary | ICD-10-CM

## 2022-05-23 MED ORDER — AZITHROMYCIN 250 MG PO TABS: ORAL_TABLET | ORAL | 0 refills | Status: DC

## 2022-05-23 MED ORDER — FLUCONAZOLE 150 MG PO TABS: ORAL_TABLET | ORAL | 0 refills | Status: DC

## 2022-05-23 NOTE — Progress Notes (Signed)
Patient notified of chest xray results. Will call in azithromycin for antiinflammatory quality and diflucan to take if needed. She will call the clinic to let us know which pharmacy to call into.

## 2022-05-23 NOTE — Telephone Encounter (Signed)
Patient calling per Thermon Leyland, fnp. Thurston Hole had said for patient to call office and give pharmacy name and number because anne is sending in zpak 500mg  day 1 then 250 mg day 2-5 and diflucan 150 [facility] to [facility] [address]. Patient aware.

## 2022-05-25 IMAGING — CT CT ABD-PELV W/ CM
2 of 5 series · 15 of 46 positions shown, 17 images · IV contrast (Omnipaque)
Comparison: CT abdomen and pelvis 01/09/2015.

CLINICAL DATA: Suspect diverticulitis.  UTI.

EXAM:
CT ABDOMEN AND PELVIS WITH CONTRAST
TECHNIQUE: Multidetector CT imaging of the abdomen and pelvis was performed
using the standard protocol following bolus administration of
intravenous contrast.
CONTRAST:  100mL OMNIPAQUE IOHEXOL 300 MG/ML  SOLN

[Series 3: axial st · axial · 0.95mm/px · z∈[+738,+1148]mm · 12 of 92 slices shown, 14 images]
[im 5/92  soft-tissue]
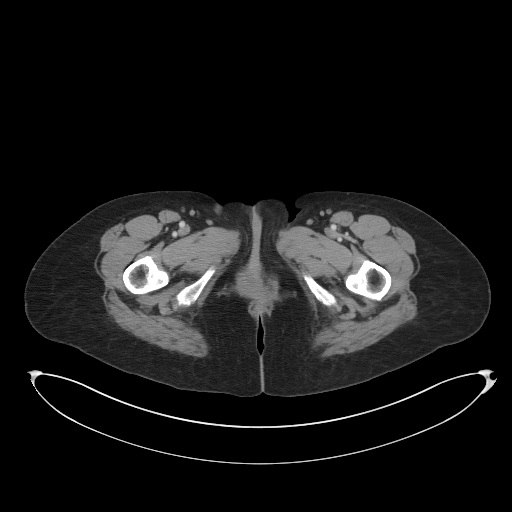
[im 5/92  bone]
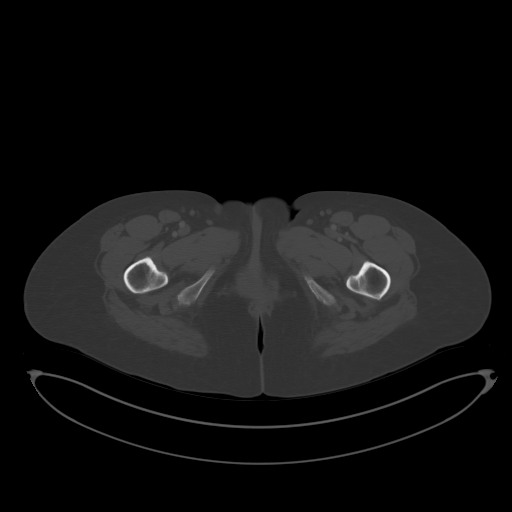
[im 14/92  soft-tissue]
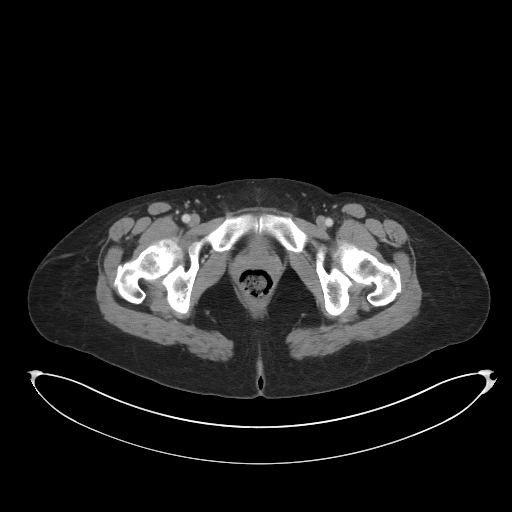
[im 19/92  soft-tissue]
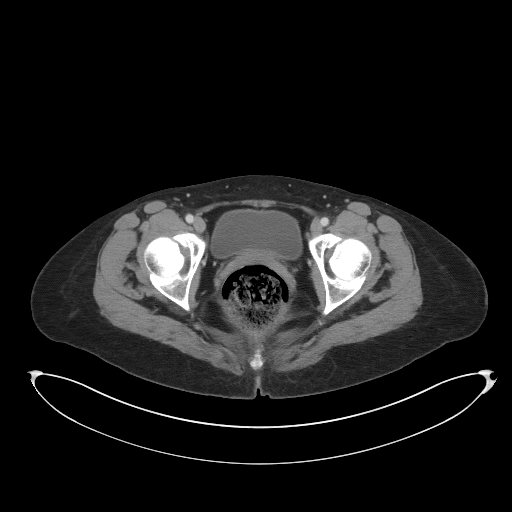
[im 28/92  soft-tissue]
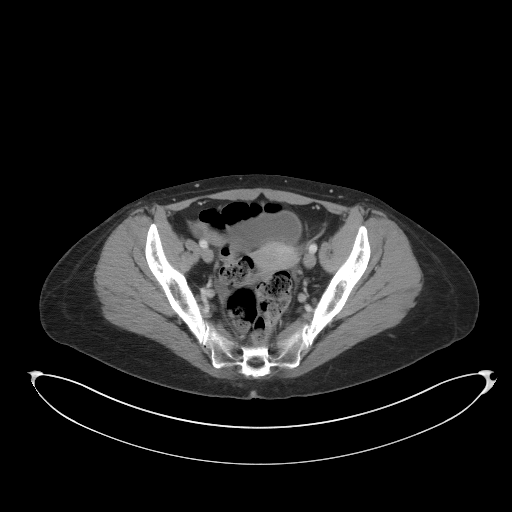
[im 37/92  soft-tissue]
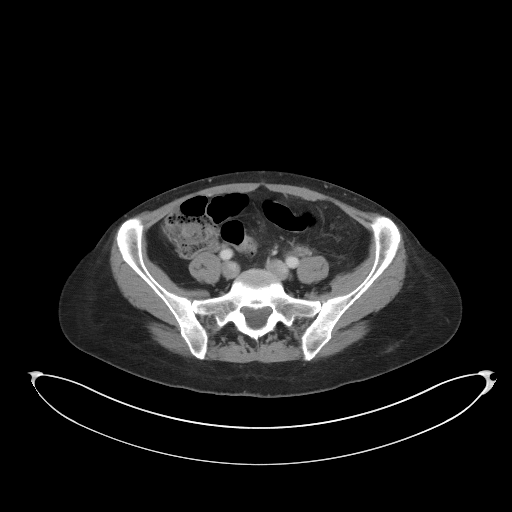
[im 41/92  soft-tissue]
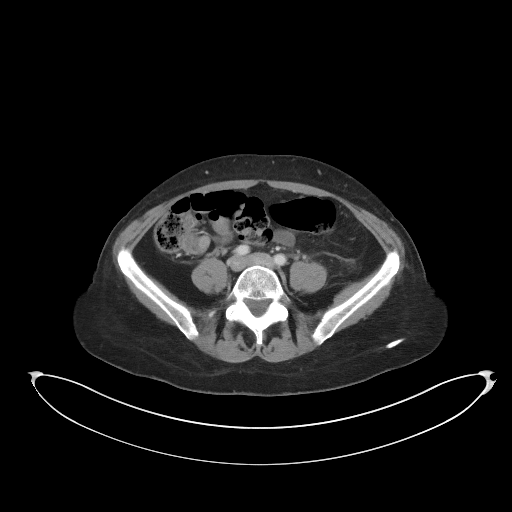
[im 51/92  soft-tissue]
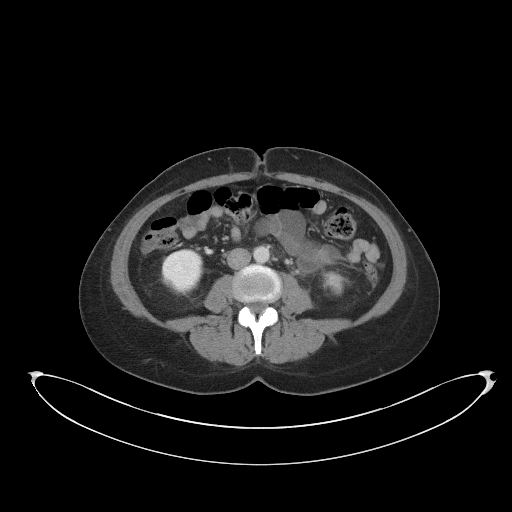
[im 55/92  soft-tissue]
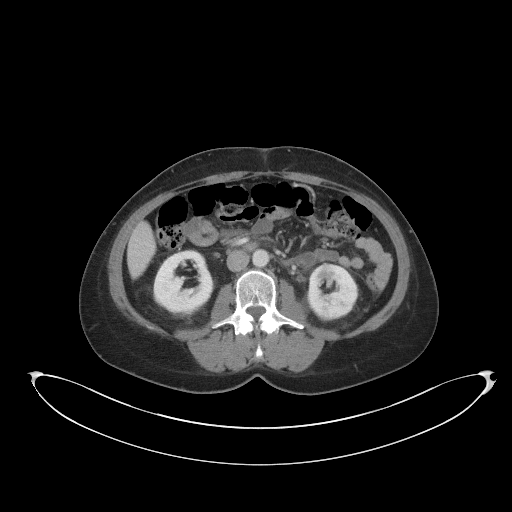
[im 64/92  soft-tissue]
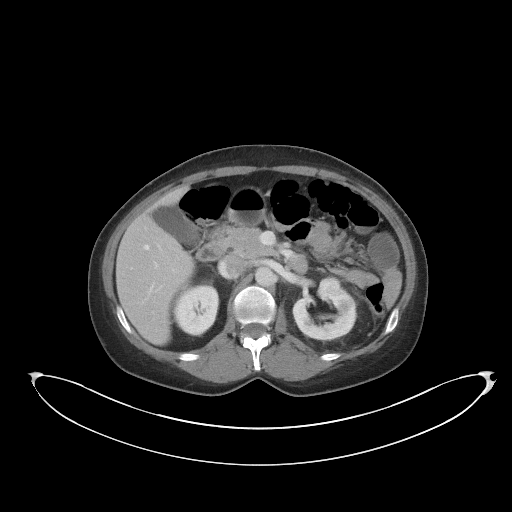
[im 64/92  bone]
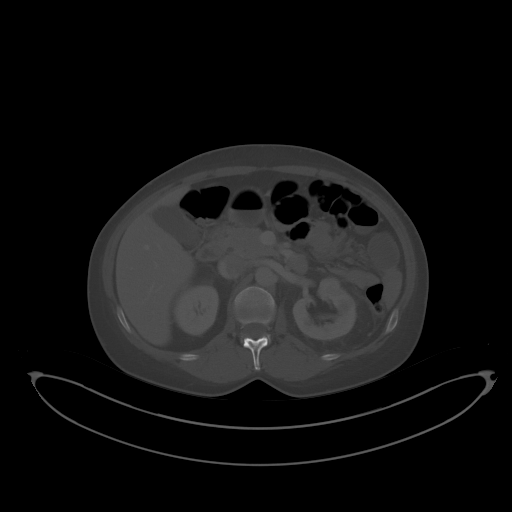
[im 73/92  soft-tissue]
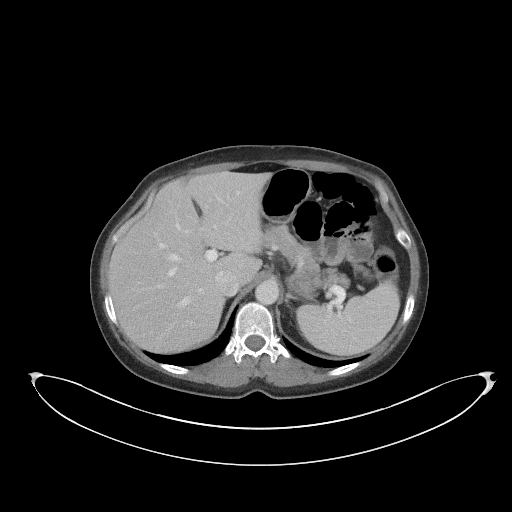
[im 78/92  soft-tissue]
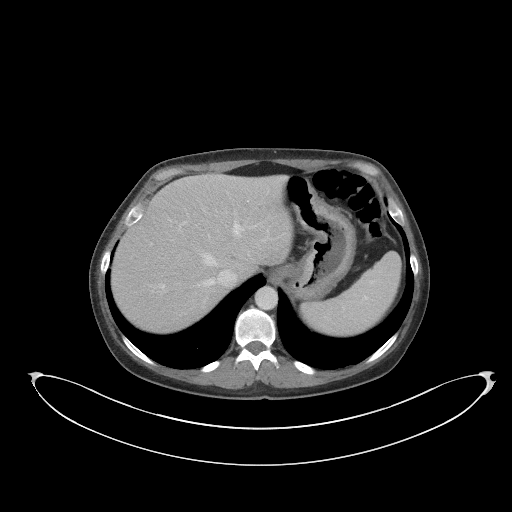
[im 87/92  soft-tissue]
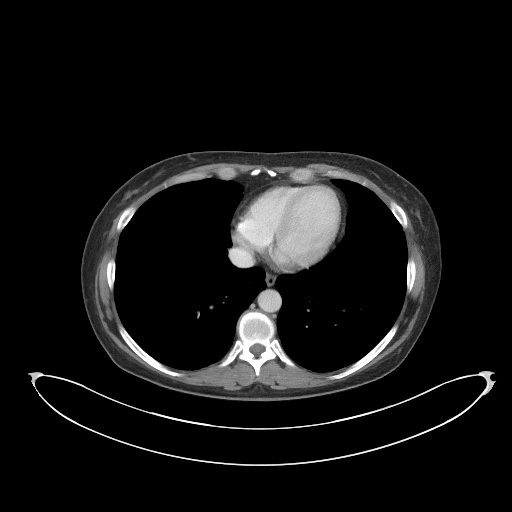

[Series 6: coronal st · coronal · 0.64mm/px · 3 of 97 slices shown]
[im 33/97  soft-tissue]
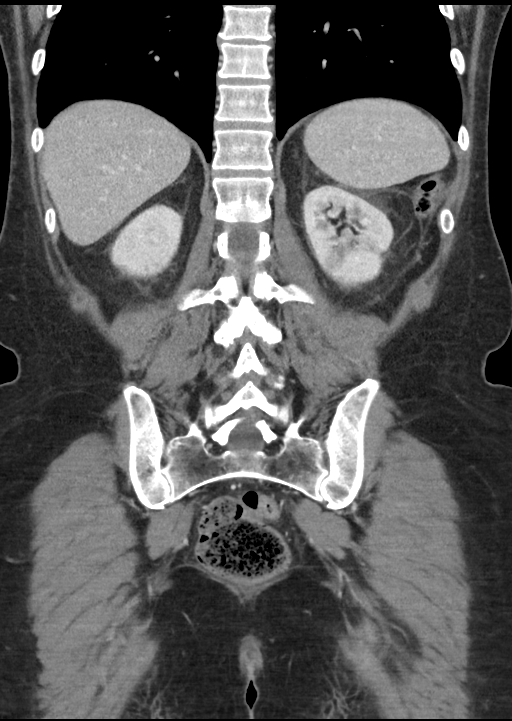
[im 43/97  soft-tissue]
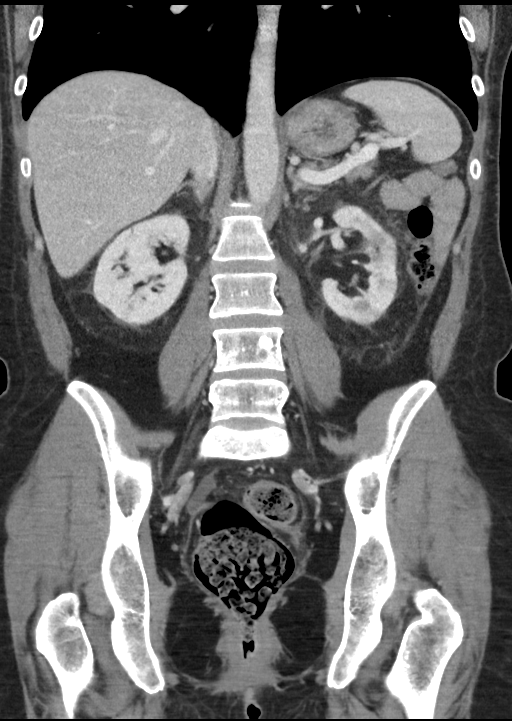
[im 54/97  soft-tissue]
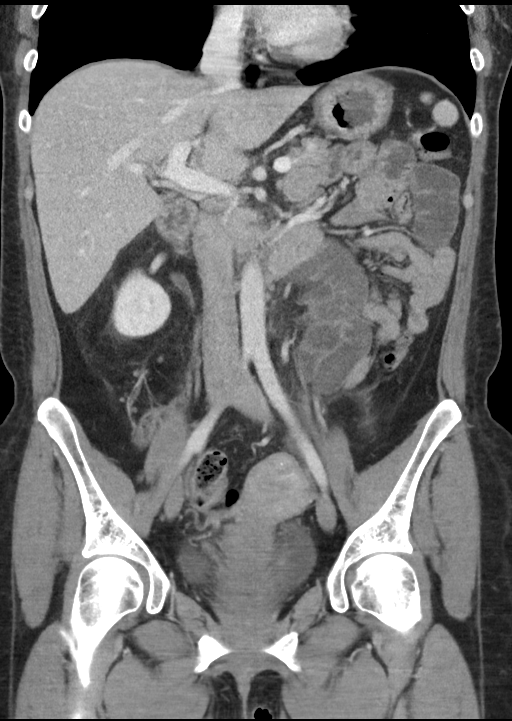

[15 of 46 positions shown; findings below may reference images not displayed]

FINDINGS: Lower chest: No acute abnormality.

Hepatobiliary: There is a hypodensity in the left lobe of the liver
which is too small to characterize and unchanged, likely a cyst or
hemangioma. The liver is otherwise within normal limits. Gallbladder
and bile ducts are within normal limits.

Pancreas: Unremarkable. No pancreatic ductal dilatation or
surrounding inflammatory changes.

Spleen: Normal in size without focal abnormality.

Adrenals/Urinary Tract: There is patchy parenchymal hypodensity
throughout the left kidney concerning for pyelonephritis. There is
mild left perinephric and Peri ureteral stranding. No obstructing
calculi are seen. The bladder, right kidney, and adrenal glands are
within normal limits.

Stomach/Bowel: Stomach is within normal limits. Appendix appears
normal. No evidence of bowel wall thickening, distention, or
inflammatory changes. There are scattered air-fluid levels
throughout small bowel. There is a large amount of stool in the
rectum.

Vascular/Lymphatic: No significant vascular findings are present. No
enlarged abdominal or pelvic lymph nodes.

Reproductive: Uterus and bilateral adnexa are unremarkable.

Other: There is a small fat containing umbilical hernia. There is no
ascites.

Musculoskeletal: No acute or significant osseous findings.
IMPRESSION: 1. Findings compatible with left-sided pyelonephritis.
2. Scattered air-fluid levels throughout small bowel loops. Findings
are nonspecific, but can be seen with enteritis.

## 2022-05-26 ENCOUNTER — Ambulatory Visit (INDEPENDENT_AMBULATORY_CARE_PROVIDER_SITE_OTHER): Payer: BC Managed Care – PPO

## 2022-05-26 DIAGNOSIS — J309 Allergic rhinitis, unspecified: Secondary | ICD-10-CM

## 2022-05-29 ENCOUNTER — Ambulatory Visit (INDEPENDENT_AMBULATORY_CARE_PROVIDER_SITE_OTHER): Payer: BC Managed Care – PPO | Admitting: *Deleted

## 2022-05-29 DIAGNOSIS — J309 Allergic rhinitis, unspecified: Secondary | ICD-10-CM

## 2022-05-31 DIAGNOSIS — J3089 Other allergic rhinitis: Secondary | ICD-10-CM | POA: Diagnosis not present

## 2022-05-31 NOTE — Progress Notes (Signed)
VIALS EXP 06-01-23 

## 2022-06-13 ENCOUNTER — Ambulatory Visit (INDEPENDENT_AMBULATORY_CARE_PROVIDER_SITE_OTHER): Payer: BC Managed Care – PPO

## 2022-06-13 DIAGNOSIS — J309 Allergic rhinitis, unspecified: Secondary | ICD-10-CM

## 2022-07-03 ENCOUNTER — Ambulatory Visit (INDEPENDENT_AMBULATORY_CARE_PROVIDER_SITE_OTHER): Payer: BC Managed Care – PPO

## 2022-07-03 DIAGNOSIS — J309 Allergic rhinitis, unspecified: Secondary | ICD-10-CM

## 2022-07-11 ENCOUNTER — Encounter: Payer: Self-pay | Admitting: Family Medicine

## 2022-07-11 ENCOUNTER — Ambulatory Visit (INDEPENDENT_AMBULATORY_CARE_PROVIDER_SITE_OTHER): Payer: BC Managed Care – PPO | Admitting: Family Medicine

## 2022-07-11 VITALS — BP 140/78 | HR 82 | Temp 97.9°F | Resp 18

## 2022-07-11 DIAGNOSIS — J3089 Other allergic rhinitis: Secondary | ICD-10-CM

## 2022-07-11 DIAGNOSIS — K219 Gastro-esophageal reflux disease without esophagitis: Secondary | ICD-10-CM | POA: Diagnosis not present

## 2022-07-11 DIAGNOSIS — J4540 Moderate persistent asthma, uncomplicated: Secondary | ICD-10-CM | POA: Diagnosis not present

## 2022-07-11 DIAGNOSIS — H1013 Acute atopic conjunctivitis, bilateral: Secondary | ICD-10-CM | POA: Diagnosis not present

## 2022-07-11 DIAGNOSIS — J302 Other seasonal allergic rhinitis: Secondary | ICD-10-CM

## 2022-07-11 MED ORDER — MONTELUKAST SODIUM 10 MG PO TABS: 10.0000 mg | ORAL_TABLET | Freq: Every day | ORAL | 1 refills | Status: DC

## 2022-07-11 NOTE — Progress Notes (Signed)
[place] [address] Dept: [phone]  FOLLOW UP NOTE  Patient ID: Bonnie Lowe, female    DOB: [birth year]  Age: 52 y.o. MRN: [id number] Date of Office Visit: 07/11/2022  Assessment  Chief Complaint: Follow-up  HPI Bonnie Lowe is a 52 year old female who presents to the clinic for follow-up visit.  She was last seen in this clinic on 05/23/2022 by Gareth Morgan, FNP, for evaluation of cough, allergic rhinitis on allergen immunotherapy, and reflux.    At today's visit, she reports her asthma has been well-controlled with no shortness of breath, cough, or wheeze with activity or rest.  She continues montelukast about 4 days a week and is currently using Symbicort 160-2 puffs once or twice a day.  She rarely uses albuterol and is not using Spiriva.  She is interested in seeing a pulmonologist for further investigation of recent long-lasting cough which has resolved and exposure to asbestos many years ago.  Allergic rhinitis is reported as well-controlled with no symptoms at this time including rhinorrhea, congestion, sneezing, or postnasal drainage.  She continues cetirizine and Flonase daily with resolution of symptoms.  She continues allergen immunotherapy with no large or local reactions.  She reports a significant decrease in her symptoms of allergic rhinitis while continuing on allergen immunotherapy.    Reflux is reported as well-controlled with omeprazole as needed which she reports is infrequently.  She denies symptoms including heartburn or vomiting.    Her current medications are listed in the chart.   Drug Allergies:  Allergies  Allergen Reactions   Moxifloxacin Hcl Itching   Cefdinir Hives and Diarrhea   Citrus Diarrhea   Erythromycin    Pineapple Other (See Comments)   Sulfa Antibiotics     Physical Exam: BP (!) 140/78   Pulse 82   Temp 97.9 F (36.6 C) (Temporal)   Resp 18   SpO2 98%    Physical Exam Vitals reviewed.  Constitutional:       Appearance: Normal appearance.  HENT:     Head: Normocephalic and atraumatic.     Right Ear: Tympanic membrane normal.     Left Ear: Tympanic membrane normal.     Nose:     Comments: Bilateral naris the is with clear nasal drainage noted.  Pharynx normal.  Ears normal.  Eyes normal.    Mouth/Throat:     Pharynx: Oropharynx is clear.  Eyes:     Conjunctiva/sclera: Conjunctivae normal.  Cardiovascular:     Rate and Rhythm: Normal rate and regular rhythm.     Heart sounds: Normal heart sounds. No murmur heard. Pulmonary:     Effort: Pulmonary effort is normal.     Breath sounds: Normal breath sounds.     Comments: Lungs clear to auscultation Musculoskeletal:        General: Normal range of motion.     Cervical back: Normal range of motion and neck supple.  Skin:    General: Skin is warm and dry.  Neurological:     Mental Status: She is alert and oriented to person, place, and time.  Psychiatric:        Mood and Affect: Mood normal.        Behavior: Behavior normal.        Thought Content: Thought content normal.        Judgment: Judgment normal.     Diagnostics: FVC 3.64, FEV1 2.89.  Predicted FVC 3.46, predicted FEV1 2.75.  Spirometry indicates normal ventilatory function.  Assessment and Plan: 1. Moderate persistent asthma without complication   2. Seasonal and perennial allergic rhinitis   3. Allergic conjunctivitis of both eyes   4. Gastroesophageal reflux disease, unspecified whether esophagitis present     Meds ordered this encounter  Medications   montelukast (SINGULAIR) 10 MG tablet    Sig: Take 1 tablet (10 mg total) by mouth at bedtime.    Dispense:  90 tablet    Refill:  1    Please dispense 90 day supply.    Patient Instructions  Asthma Continue montelukast 10 mg once a day to prevent cough or wheeze Continue Symbicort 160-2 puffs once or twice a day with a spacer to prevent cough or wheeze Continue albuterol 2 puffs every 4 hours as needed for cough  or wheeze OR Instead use albuterol 0.083% solution via nebulizer one unit vial every 4 hours as needed for cough or wheeze For asthma flare, begin Spiriva 1.25 mcg 2 puffs once a day for 2 weeks or until cough and wheeze free Consider Xolair injections if needed for asthma control  Cough Resolved Refer to pulmonology for evaluation of prior asbestos exposure  Allergic rhinitis/allergic conjunctivitis Continue allergen avoidance measures directed toward mold, tree pollen, tree pollen, dust mite, and dog as listed below Continue allergen immunotherapy and have access to an epinephrine autoinjector set per protocol Continue azelastine/fluticasone nasal spray 2 sprays in each nostril up to twice a day as needed for nasal symptoms Continue ipratropium 2 sprays in each nostril up to twice a day as needed for a runny nose Continue cetirizine 10 mg once a day as needed for runny nose Consider Mucinex 600 to 1200 mg twice a day as needed for nasal congestion Consider saline nasal rinses as needed for nasal symptoms. Use this before any medicated nasal sprays for best result  Reflux Continue dietary and lifestyle modifications as listed below Continue omeprazole once a day to control reflux. Take this medication 30 minutes before your first meal for best result  Call the clinic if this treatment plan is not working well for you.  Follow up in 6 months or sooner if needed.   Return in about 6 months (around 01/09/2023), or if symptoms worsen or fail to improve.    Thank you for the opportunity to care for this patient.  Please do not hesitate to contact me with questions.  Gareth Morgan, FNP [facility]

## 2022-07-11 NOTE — Patient Instructions (Addendum)
Asthma Continue montelukast 10 mg once a day to prevent cough or wheeze Continue Symbicort 160-2 puffs once or twice a day with a spacer to prevent cough or wheeze Continue albuterol 2 puffs every 4 hours as needed for cough or wheeze OR Instead use albuterol 0.083% solution via nebulizer one unit vial every 4 hours as needed for cough or wheeze For asthma flare, begin Spiriva 1.25 mcg 2 puffs once a day for 2 weeks or until cough and wheeze free Consider Xolair injections if needed for asthma control  Cough Resolved Refer to pulmonology for evaluation of prior asbestos exposure  Allergic rhinitis/allergic conjunctivitis Continue allergen avoidance measures directed toward mold, tree pollen, tree pollen, dust mite, and dog as listed below Continue allergen immunotherapy and have access to an epinephrine autoinjector set per protocol Continue azelastine/fluticasone nasal spray 2 sprays in each nostril up to twice a day as needed for nasal symptoms Continue ipratropium 2 sprays in each nostril up to twice a day as needed for a runny nose Continue cetirizine 10 mg once a day as needed for runny nose Consider Mucinex 600 to 1200 mg twice a day as needed for nasal congestion Consider saline nasal rinses as needed for nasal symptoms. Use this before any medicated nasal sprays for best result  Reflux Continue dietary and lifestyle modifications as listed below Continue omeprazole once a day to control reflux. Take this medication 30 minutes before your first meal for best result  Call the clinic if this treatment plan is not working well for you.  Follow up in 6 months or sooner if needed.  Reducing Pollen Exposure The American Academy of All[employer] suggests the following steps to reduce your exposure to pollen during allergy seasons. Do not hang sheets or clothing out to dry; pollen may collect on these items. Do not mow lawns or spend time around freshly cut grass;  mowing stirs up pollen. Keep windows closed at night.  Keep car windows closed while driving. Minimize morning activities outdoors, a time when pollen counts are usually at their highest. Stay indoors as much as possible when pollen counts or humidity is high and on windy days when pollen tends to remain in the air longer. Use air conditioning when possible.  Many air conditioners have filters that trap the pollen spores. Use a HEPA room air filter to remove pollen form the indoor air you breathe.  Control of Mold Allergen Mold and fungi can grow on a variety of surfaces provided certain temperature and moisture conditions exist.  Outdoor molds grow on plants, decaying vegetation and soil.  The major outdoor mold, Alternaria and Cladosporium, are found in very high numbers during hot and dry conditions.  Generally, a late Summer - Fall peak is seen for common outdoor fungal spores.  Rain will temporarily lower outdoor mold spore count, but counts rise rapidly when the rainy period ends.  The most important indoor molds are Aspergillus and Penicillium.  Dark, humid and poorly ventilated basements are ideal sites for mold growth.  The next most common sites of mold growth are the bathroom and the kitchen.  Outdoor [employer] Use air conditioning and keep windows closed Avoid exposure to decaying vegetation. Avoid leaf raking. Avoid grain handling. Consider wearing a face mask if working in moldy areas.  Indoor Mold Control Maintain humidity below 50%. Clean washable surfaces with 5% bleach solution. Remove sources e.g. Contaminated carpets.   Control of Dust Mite Allergen Dust mites play a major  role in allergic asthma and rhinitis. They occur in environments with high humidity wherever human skin is found. Dust mites absorb humidity from the atmosphere (ie, they do not drink) and feed on organic matter (including shed human and animal skin). Dust mites are a microscopic type of insect that  you cannot see with the naked eye. High levels of dust mites have been detected from mattresses, pillows, carpets, upholstered furniture, bed covers, clothes, soft toys and any woven material. The principal allergen of the dust mite is found in its feces. A gram of dust may contain 1,000 mites and 250,000 fecal particles. Mite antigen is easily measured in the air during house cleaning activities. Dust mites do not bite and do not cause harm to humans, other than by triggering allergies/asthma.  Ways to decrease your exposure to dust mites in your home:  1. Encase mattresses, box springs and pillows with a mite-impermeable barrier or cover  2. Wash sheets, blankets and drapes weekly in hot water (130 F) with detergent and dry them in a dryer on the hot setting.  3. Have the room cleaned frequently with a vacuum cleaner and a damp dust-mop. For carpeting or rugs, vacuuming with a vacuum cleaner equipped with a high-efficiency particulate air (HEPA) filter. The dust mite allergic individual should not be in a room which is being cleaned and should wait 1 hour after cleaning before going into the room.  4. Do not sleep on upholstered furniture (eg, couches).  5. If possible removing carpeting, upholstered furniture and drapery from the home is ideal. Horizontal blinds should be eliminated in the rooms where the person spends the most time (bedroom, study, television room). Washable vinyl, roller-type shades are optimal.  6. Remove all non-washable stuffed toys from the bedroom. Wash stuffed toys weekly like sheets and blankets above.  7. Reduce indoor humidity to less than 50%. Inexpensive humidity monitors can be purchased at most hardware stores. Do not use a humidifier as can make the problem worse and are not recommended.  Control of Dog or Cat Allergen Avoidance is the best way to manage a dog or cat allergy. If you have a dog or cat and are allergic to dog or cats, consider removing the dog or  cat from the home. If you have a dog or cat but don't want to find it a new home, or if your family wants a pet even though someone in the household is allergic, here are some strategies that may help keep symptoms at bay:  Keep the pet out of your bedroom and restrict it to only a few rooms. Be advised that keeping the dog or cat in only one room will not limit the allergens to that room. Don't pet, hug or kiss the dog or cat; if you do, wash your hands with soap and water. High-efficiency particulate air (HEPA) cleaners run continuously in a bedroom or living room can reduce allergen levels over time. Regular use of a high-efficiency vacuum cleaner or a central vacuum can reduce allergen levels. Giving your dog or cat a bath at least once a week can reduce airborne allergen.

## 2022-07-12 ENCOUNTER — Telehealth: Payer: Self-pay | Admitting: Family Medicine

## 2022-07-12 NOTE — Telephone Encounter (Signed)
Referral has been sent to  [facility] [address] [place], [place] [address] ([phone]  Referral and all corresponding notes have been faxed to their office.  They will reach out to patient to schedule.

## 2022-07-25 ENCOUNTER — Ambulatory Visit (INDEPENDENT_AMBULATORY_CARE_PROVIDER_SITE_OTHER): Payer: BC Managed Care – PPO

## 2022-07-25 DIAGNOSIS — J309 Allergic rhinitis, unspecified: Secondary | ICD-10-CM

## 2022-08-01 ENCOUNTER — Ambulatory Visit (INDEPENDENT_AMBULATORY_CARE_PROVIDER_SITE_OTHER): Payer: BC Managed Care – PPO

## 2022-08-01 DIAGNOSIS — J309 Allergic rhinitis, unspecified: Secondary | ICD-10-CM | POA: Diagnosis not present

## 2022-08-11 ENCOUNTER — Ambulatory Visit (INDEPENDENT_AMBULATORY_CARE_PROVIDER_SITE_OTHER): Payer: BC Managed Care – PPO

## 2022-08-11 DIAGNOSIS — J309 Allergic rhinitis, unspecified: Secondary | ICD-10-CM | POA: Diagnosis not present

## 2022-08-18 ENCOUNTER — Ambulatory Visit (INDEPENDENT_AMBULATORY_CARE_PROVIDER_SITE_OTHER): Payer: BC Managed Care – PPO

## 2022-08-18 DIAGNOSIS — J309 Allergic rhinitis, unspecified: Secondary | ICD-10-CM

## 2022-08-21 ENCOUNTER — Ambulatory Visit (INDEPENDENT_AMBULATORY_CARE_PROVIDER_SITE_OTHER): Payer: BC Managed Care – PPO

## 2022-08-21 DIAGNOSIS — J309 Allergic rhinitis, unspecified: Secondary | ICD-10-CM

## 2022-09-06 ENCOUNTER — Ambulatory Visit (INDEPENDENT_AMBULATORY_CARE_PROVIDER_SITE_OTHER): Payer: BC Managed Care – PPO

## 2022-09-06 DIAGNOSIS — J309 Allergic rhinitis, unspecified: Secondary | ICD-10-CM

## 2022-09-11 DIAGNOSIS — J3089 Other allergic rhinitis: Secondary | ICD-10-CM

## 2022-09-11 NOTE — Progress Notes (Signed)
EXP 09/11/23

## 2022-09-21 ENCOUNTER — Ambulatory Visit (INDEPENDENT_AMBULATORY_CARE_PROVIDER_SITE_OTHER): Payer: BC Managed Care – PPO

## 2022-09-21 DIAGNOSIS — J309 Allergic rhinitis, unspecified: Secondary | ICD-10-CM

## 2022-10-04 ENCOUNTER — Ambulatory Visit (INDEPENDENT_AMBULATORY_CARE_PROVIDER_SITE_OTHER): Payer: BC Managed Care – PPO

## 2022-10-04 DIAGNOSIS — J309 Allergic rhinitis, unspecified: Secondary | ICD-10-CM | POA: Diagnosis not present

## 2022-10-16 NOTE — Progress Notes (Signed)
VIALS NOT NEEDED YET. 

## 2022-10-19 ENCOUNTER — Other Ambulatory Visit: Payer: Self-pay

## 2022-10-19 ENCOUNTER — Ambulatory Visit: Payer: Self-pay

## 2022-10-19 ENCOUNTER — Ambulatory Visit (INDEPENDENT_AMBULATORY_CARE_PROVIDER_SITE_OTHER): Payer: BC Managed Care – PPO | Admitting: Family

## 2022-10-19 ENCOUNTER — Encounter: Payer: Self-pay | Admitting: Family

## 2022-10-19 VITALS — BP 128/70 | HR 87 | Temp 98.6°F | Resp 20 | Wt 154.1 lb

## 2022-10-19 DIAGNOSIS — J309 Allergic rhinitis, unspecified: Secondary | ICD-10-CM

## 2022-10-19 DIAGNOSIS — J4540 Moderate persistent asthma, uncomplicated: Secondary | ICD-10-CM

## 2022-10-19 DIAGNOSIS — H9203 Otalgia, bilateral: Secondary | ICD-10-CM

## 2022-10-19 DIAGNOSIS — J302 Other seasonal allergic rhinitis: Secondary | ICD-10-CM

## 2022-10-19 DIAGNOSIS — K219 Gastro-esophageal reflux disease without esophagitis: Secondary | ICD-10-CM

## 2022-10-19 DIAGNOSIS — H1013 Acute atopic conjunctivitis, bilateral: Secondary | ICD-10-CM | POA: Diagnosis not present

## 2022-10-19 MED ORDER — PREDNISONE 10 MG PO TABS: ORAL_TABLET | ORAL | 0 refills | Status: DC

## 2022-10-19 NOTE — Progress Notes (Signed)
[address] [place] [address] Dept: [phone]  FOLLOW UP NOTE  Patient ID: Bonnie Lowe, female    DOB: 1970-09-20  Age: 52 y.o. MRN: [id number] Date of Office Visit: 10/19/2022  Assessment  Chief Complaint: Follow-up and Otalgia (Ear pain, feels like swimmers ear back from [facility] trip and traveling to [place] end of week)  HPI Bonnie Lowe is a 52 year old female who presents today for an acute visit of bilateral ear pain.  She was last seen on July 11, 2022 by Thermon Leyland, FNP for moderate persistent asthma without complication, seasonal and perennial allergic rhinitis, allergic conjunctivitis, and gastroesophageal reflux disease.  She denies any new diagnosis or surgery since her last office visit.  She reports that last week she was in [place], [place] swimming in the ocean and for the past few days she has been having bilateral ear pain that is getting worse each day.  Her symptoms started Monday or Tuesday with just  faint aching.  She feels like she has swimmers ear starting.  She used to get this when she was younger.  She denies any ear pressure, drainage, fever or chills.  She also denies pain upon palpitation of both ears.  She has concerns about her ear pain due to having to get on an airplane Monday and fly to [place] due to work. She reports that she has taken prednisone before with no problems.  Allergic rhinitis/allergic conjunctivitis: She denies rhinorrhea, nasal congestion, and postnasal drip.  She continues to receive allergy injections per protocol and denies any problems or reactions.  She is not certain of the nose sprays that she is using.  She thinks she just has azelastine nasal spray and ipratropium bromide nasal spray.  She is not sure if she has the combination azelastine/fluticasone nasal spray.  She does take cetirizine 10 mg daily and has not used saline rinse recently.  She is also only using Mucinex as needed.  She has not been treated for any  sinus infections since we last saw her.    Asthma: She continues to take montelukast 10 mg once a day, Symbicort 160/4.5 mcg 2 puffs once or twice a day, albuterol as needed, and Spiriva 1.25 mcg for asthma flares.  She denies cough, wheeze, tightness in chest, shortness of breath, and nocturnal awakenings due to breathing problems.  Since her last office visit she has not made any trips to the emergency room or urgent care due to breathing problems.  She does mention that she got a steroid injection before going to the beach due to starting to get a chest cold and this cleared it up.  Reflux: She denies heartburn or reflux symptoms.  She is currently taking omeprazole as needed.   Drug Allergies:  Allergies  Allergen Reactions   Moxifloxacin Hcl Itching   Cefdinir Hives and Diarrhea   Citrus Diarrhea   Erythromycin    Pineapple Other (See Comments)   Sulfa Antibiotics     Review of Systems: Review of Systems  Constitutional:  Negative for chills and fever.  HENT:         Reports bilateral ear pain that is getting worse. Denies ear pressure, drainage, rhinorrhea, nasal congestion, and post nasal drip  Eyes:        Denies itchy watery eyes  Respiratory:  Negative for cough, shortness of breath and wheezing.   Cardiovascular:  Negative for chest pain and palpitations.  Gastrointestinal:  Negative for heartburn.  Skin:  Negative for itching and rash.  Neurological:  Negative for headaches.  Endo/Heme/Allergies:  Positive for environmental allergies.     Physical Exam: BP 128/70 (BP Location: Left Arm, Patient Position: Sitting, Cuff Size: Normal)   Pulse 87   Temp 98.6 F (37 C) (Temporal)   Resp 20   Wt 154 lb 1.6 oz (69.9 kg)   SpO2 98%   BMI 25.64 kg/m    Physical Exam Constitutional:      Appearance: Normal appearance.  HENT:     Head: Normocephalic and atraumatic.     Comments: Pharynx normal, eyes normal, nose normal, right ear normal, left ear: Clear fluid noted  behind tympanic membrane. No erythema or drainage noted    Right Ear: Tympanic membrane, ear canal and external ear normal.     Left Ear: Ear canal and external ear normal.     Nose: Nose normal.     Mouth/Throat:     Mouth: Mucous membranes are moist.     Pharynx: Oropharynx is clear.  Eyes:     Conjunctiva/sclera: Conjunctivae normal.  Cardiovascular:     Rate and Rhythm: Regular rhythm.     Heart sounds: Normal heart sounds.  Pulmonary:     Effort: Pulmonary effort is normal.     Breath sounds: Normal breath sounds.     Comments: Lungs clear to auscultation Musculoskeletal:     Cervical back: Neck supple.  Skin:    General: Skin is warm.  Neurological:     Mental Status: She is alert and oriented to person, place, and time.  Psychiatric:        Mood and Affect: Mood normal.        Behavior: Behavior normal.        Thought Content: Thought content normal.        Judgment: Judgment normal.     Diagnostics: None   Assessment and Plan: 1. Ear pain, bilateral   2. Moderate persistent asthma without complication   3. Seasonal and perennial allergic rhinitis   4. Allergic conjunctivitis of both eyes   5. Gastroesophageal reflux disease, unspecified whether esophagitis present     Meds ordered this encounter  Medications   predniSONE (DELTASONE) 10 MG tablet    Sig: Take 1 tablet by mouth twice a day for 5 days    Dispense:  10 tablet    Refill:  0    Patient Instructions  Asthma Continue montelukast 10 mg once a day to prevent cough or wheeze Continue Symbicort 1604.5 mcg-2 puffs once or twice a day with a spacer to prevent cough or wheeze Continue albuterol 2 puffs every 4 hours as needed for cough or wheeze OR Instead use albuterol 0.083% solution via nebulizer one unit vial every 4 hours as needed for cough or wheeze For asthma flare, begin Spiriva 1.25 mcg 2 puffs once a day for 2 weeks or until cough and wheeze free Consider Xolair injections if needed for  asthma control  Asthma control goals:  Full participation in all desired activities (may need albuterol before activity) Albuterol use two time or less a week on average (not counting use with activity) Cough interfering with sleep two time or less a month Oral steroids no more than once a year No hospitalizations   Cough Resolved Referred to pulmonology at previous visit for evaluation of prior asbestos exposure  Allergic rhinitis/allergic conjunctivitis Start prednisone 10 mg taking 1 tablet twice a day for 5 days. If your ear continues to bother  you while you are in [place] please go to an urgent care Call us with the name of the nose sprays that you are using. Continue allergen avoidance measures directed toward mold, weed pollen, tree pollen, dust mite, and dog as listed below Continue allergen immunotherapy and have access to an epinephrine autoinjector set per protocol Continue azelastine/fluticasone nasal spray 2 sprays in each nostril up to twice a day as needed for nasal symptoms Continue ipratropium 2 sprays in each nostril up to twice a day as needed for a runny nose Continue cetirizine 10 mg once a day as needed for runny nose Consider Mucinex 600 to 1200 mg twice a day as needed for nasal congestion Consider saline nasal rinses as needed for nasal symptoms. Use this before any medicated nasal sprays for best result  Reflux Continue dietary and lifestyle modifications as listed below Continue omeprazole once a day to control reflux. Take this medication 30 minutes before your first meal for best result  Call the clinic if this treatment plan is not working well for you.  Follow up in 6 months or sooner if needed.  Reducing Pollen Exposure The American Academy of Allergy, Asthma and Immunology suggests the following steps to reduce your exposure to pollen during allergy seasons. Do not hang sheets or clothing out to dry; pollen may collect on these items. Do not mow  lawns or spend time around freshly cut grass; mowing stirs up pollen. Keep windows closed at night.  Keep car windows closed while driving. Minimize morning activities outdoors, a time when pollen counts are usually at their highest. Stay indoors as much as possible when pollen counts or humidity is high and on windy days when pollen tends to remain in the air longer. Use air conditioning when possible.  Many air conditioners have filters that trap the pollen spores. Use a HEPA room air filter to remove pollen form the indoor air you breathe.  Control of Mold Allergen Mold and fungi can grow on a variety of surfaces provided certain temperature and moisture conditions exist.  Outdoor molds grow on plants, decaying vegetation and soil.  The major outdoor mold, Alternaria and Cladosporium, are found in very high numbers during hot and dry conditions.  Generally, a late Summer - Fall peak is seen for common outdoor fungal spores.  Rain will temporarily lower outdoor mold spore count, but counts rise rapidly when the rainy period ends.  The most important indoor molds are Aspergillus and Penicillium.  Dark, humid and poorly ventilated basements are ideal sites for mold growth.  The next most common sites of mold growth are the bathroom and the kitchen.  Outdoor [employer] Use air conditioning and keep windows closed Avoid exposure to decaying vegetation. Avoid leaf raking. Avoid grain handling. Consider wearing a face mask if working in moldy areas.  Indoor Mold Control Maintain humidity below 50%. Clean washable surfaces with 5% bleach solution. Remove sources e.g. Contaminated carpets.   Control of Dust Mite Allergen Dust mites play a major role in allergic asthma and rhinitis. They occur in environments with high humidity wherever human skin is found. Dust mites absorb humidity from the atmosphere (ie, they do not drink) and feed on organic matter (including shed human and animal skin). Dust  mites are a microscopic type of insect that you cannot see with the naked eye. High levels of dust mites have been detected from mattresses, pillows, carpets, upholstered furniture, bed covers, clothes, soft toys and any woven material. The principal  allergen of the dust mite is found in its feces. A gram of dust may contain 1,000 mites and 250,000 fecal particles. Mite antigen is easily measured in the air during house cleaning activities. Dust mites do not bite and do not cause harm to humans, other than by triggering allergies/asthma.  Ways to decrease your exposure to dust mites in your home:  1. Encase mattresses, box springs and pillows with a mite-impermeable barrier or cover  2. Wash sheets, blankets and drapes weekly in hot water (130 F) with detergent and dry them in a dryer on the hot setting.  3. Have the room cleaned frequently with a vacuum cleaner and a damp dust-mop. For carpeting or rugs, vacuuming with a vacuum cleaner equipped with a high-efficiency particulate air (HEPA) filter. The dust mite allergic individual should not be in a room which is being cleaned and should wait 1 hour after cleaning before going into the room.  4. Do not sleep on upholstered furniture (eg, couches).  5. If possible removing carpeting, upholstered furniture and drapery from the home is ideal. Horizontal blinds should be eliminated in the rooms where the person spends the most time (bedroom, study, television room). Washable vinyl, roller-type shades are optimal.  6. Remove all non-washable stuffed toys from the bedroom. Wash stuffed toys weekly like sheets and blankets above.  7. Reduce indoor humidity to less than 50%. Inexpensive humidity monitors can be purchased at most hardware stores. Do not use a humidifier as can make the problem worse and are not recommended.  Control of Dog or Cat Allergen Avoidance is the best way to manage a dog or cat allergy. If you have a dog or cat and are allergic  to dog or cats, consider removing the dog or cat from the home. If you have a dog or cat but don't want to find it a new home, or if your family wants a pet even though someone in the household is allergic, here are some strategies that may help keep symptoms at bay:  Keep the pet out of your bedroom and restrict it to only a few rooms. Be advised that keeping the dog or cat in only one room will not limit the allergens to that room. Don't pet, hug or kiss the dog or cat; if you do, wash your hands with soap and water. High-efficiency particulate air (HEPA) cleaners run continuously in a bedroom or living room can reduce allergen levels over time. Regular use of a high-efficiency vacuum cleaner or a central vacuum can reduce allergen levels. Giving your dog or cat a bath at least once a week can reduce airborne allergen.  Return in about 6 months (around 04/21/2023), or if symptoms worsen or fail to improve.    Thank you for the opportunity to care for this patient.  Please do not hesitate to contact me with questions.  Nehemiah Settle, FNP [facility]

## 2022-10-19 NOTE — Patient Instructions (Addendum)
Asthma Continue montelukast 10 mg once a day to prevent cough or wheeze Continue Symbicort 1604.5 mcg-2 puffs once or twice a day with a spacer to prevent cough or wheeze Continue albuterol 2 puffs every 4 hours as needed for cough or wheeze OR Instead use albuterol 0.083% solution via nebulizer one unit vial every 4 hours as needed for cough or wheeze For asthma flare, begin Spiriva 1.25 mcg 2 puffs once a day for 2 weeks or until cough and wheeze free Consider Xolair injections if needed for asthma control  Asthma control goals:  Full participation in all desired activities (may need albuterol before activity) Albuterol use two time or less a week on average (not counting use with activity) Cough interfering with sleep two time or less a month Oral steroids no more than once a year No hospitalizations   Cough Resolved Referred to pulmonology at previous visit for evaluation of prior asbestos exposure  Allergic rhinitis/allergic conjunctivitis Start prednisone 10 mg taking 1 tablet twice a day for 5 days. If your ear continues to bother you while you are in [place] please go to an urgent care Call us with the name of the nose sprays that you are using. Continue allergen avoidance measures directed toward mold, weed pollen, tree pollen, dust mite, and dog as listed below Continue allergen immunotherapy and have access to an epinephrine autoinjector set per protocol Continue azelastine/fluticasone nasal spray 2 sprays in each nostril up to twice a day as needed for nasal symptoms Continue ipratropium 2 sprays in each nostril up to twice a day as needed for a runny nose Continue cetirizine 10 mg once a day as needed for runny nose Consider Mucinex 600 to 1200 mg twice a day as needed for nasal congestion Consider saline nasal rinses as needed for nasal symptoms. Use this before any medicated nasal sprays for best result  Reflux Continue dietary and lifestyle modifications as listed  below Continue omeprazole once a day to control reflux. Take this medication 30 minutes before your first meal for best result  Call the clinic if this treatment plan is not working well for you.  Follow up in 6 months or sooner if needed.  Reducing Pollen Exposure The American Academy of Allergy, Asthma and Immunology suggests the following steps to reduce your exposure to pollen during allergy seasons. Do not hang sheets or clothing out to dry; pollen may collect on these items. Do not mow lawns or spend time around freshly cut grass; mowing stirs up pollen. Keep windows closed at night.  Keep car windows closed while driving. Minimize morning activities outdoors, a time when pollen counts are usually at their highest. Stay indoors as much as possible when pollen counts or humidity is high and on windy days when pollen tends to remain in the air longer. Use air conditioning when possible.  Many air conditioners have filters that trap the pollen spores. Use a HEPA room air filter to remove pollen form the indoor air you breathe.  Control of Mold Allergen Mold and fungi can grow on a variety of surfaces provided certain temperature and moisture conditions exist.  Outdoor molds grow on plants, decaying vegetation and soil.  The major outdoor mold, Alternaria and Cladosporium, are found in very high numbers during hot and dry conditions.  Generally, a late Summer - Fall peak is seen for common outdoor fungal spores.  Rain will temporarily lower outdoor mold spore count, but counts rise rapidly when the rainy period ends.  The  most important indoor molds are Aspergillus and Penicillium.  Dark, humid and poorly ventilated basements are ideal sites for mold growth.  The next most common sites of mold growth are the bathroom and the kitchen.  Outdoor [employer] Use air conditioning and keep windows closed Avoid exposure to decaying vegetation. Avoid leaf raking. Avoid grain handling. Consider  wearing a face mask if working in moldy areas.  Indoor Mold Control Maintain humidity below 50%. Clean washable surfaces with 5% bleach solution. Remove sources e.g. Contaminated carpets.   Control of Dust Mite Allergen Dust mites play a major role in allergic asthma and rhinitis. They occur in environments with high humidity wherever human skin is found. Dust mites absorb humidity from the atmosphere (ie, they do not drink) and feed on organic matter (including shed human and animal skin). Dust mites are a microscopic type of insect that you cannot see with the naked eye. High levels of dust mites have been detected from mattresses, pillows, carpets, upholstered furniture, bed covers, clothes, soft toys and any woven material. The principal allergen of the dust mite is found in its feces. A gram of dust may contain 1,000 mites and 250,000 fecal particles. Mite antigen is easily measured in the air during house cleaning activities. Dust mites do not bite and do not cause harm to humans, other than by triggering allergies/asthma.  Ways to decrease your exposure to dust mites in your home:  1. Encase mattresses, box springs and pillows with a mite-impermeable barrier or cover  2. Wash sheets, blankets and drapes weekly in hot water (130 F) with detergent and dry them in a dryer on the hot setting.  3. Have the room cleaned frequently with a vacuum cleaner and a damp dust-mop. For carpeting or rugs, vacuuming with a vacuum cleaner equipped with a high-efficiency particulate air (HEPA) filter. The dust mite allergic individual should not be in a room which is being cleaned and should wait 1 hour after cleaning before going into the room.  4. Do not sleep on upholstered furniture (eg, couches).  5. If possible removing carpeting, upholstered furniture and drapery from the home is ideal. Horizontal blinds should be eliminated in the rooms where the person spends the most time (bedroom, study,  television room). Washable vinyl, roller-type shades are optimal.  6. Remove all non-washable stuffed toys from the bedroom. Wash stuffed toys weekly like sheets and blankets above.  7. Reduce indoor humidity to less than 50%. Inexpensive humidity monitors can be purchased at most hardware stores. Do not use a humidifier as can make the problem worse and are not recommended.  Control of Dog or Cat Allergen Avoidance is the best way to manage a dog or cat allergy. If you have a dog or cat and are allergic to dog or cats, consider removing the dog or cat from the home. If you have a dog or cat but don't want to find it a new home, or if your family wants a pet even though someone in the household is allergic, here are some strategies that may help keep symptoms at bay:  Keep the pet out of your bedroom and restrict it to only a few rooms. Be advised that keeping the dog or cat in only one room will not limit the allergens to that room. Don't pet, hug or kiss the dog or cat; if you do, wash your hands with soap and water. High-efficiency particulate air (HEPA) cleaners run continuously in a bedroom or living room  can reduce allergen levels over time. Regular use of a high-efficiency vacuum cleaner or a central vacuum can reduce allergen levels. Giving your dog or cat a bath at least once a week can reduce airborne allergen.

## 2022-11-07 ENCOUNTER — Ambulatory Visit (INDEPENDENT_AMBULATORY_CARE_PROVIDER_SITE_OTHER): Payer: BC Managed Care – PPO

## 2022-11-07 DIAGNOSIS — J309 Allergic rhinitis, unspecified: Secondary | ICD-10-CM

## 2022-12-05 ENCOUNTER — Ambulatory Visit (INDEPENDENT_AMBULATORY_CARE_PROVIDER_SITE_OTHER): Payer: BC Managed Care – PPO

## 2022-12-05 DIAGNOSIS — J309 Allergic rhinitis, unspecified: Secondary | ICD-10-CM

## 2022-12-11 ENCOUNTER — Ambulatory Visit (INDEPENDENT_AMBULATORY_CARE_PROVIDER_SITE_OTHER): Payer: BC Managed Care – PPO

## 2022-12-11 DIAGNOSIS — J309 Allergic rhinitis, unspecified: Secondary | ICD-10-CM

## 2023-01-05 ENCOUNTER — Ambulatory Visit (INDEPENDENT_AMBULATORY_CARE_PROVIDER_SITE_OTHER): Payer: BC Managed Care – PPO

## 2023-01-05 DIAGNOSIS — J309 Allergic rhinitis, unspecified: Secondary | ICD-10-CM

## 2023-01-11 ENCOUNTER — Ambulatory Visit (INDEPENDENT_AMBULATORY_CARE_PROVIDER_SITE_OTHER): Payer: BC Managed Care – PPO

## 2023-01-11 DIAGNOSIS — J309 Allergic rhinitis, unspecified: Secondary | ICD-10-CM | POA: Diagnosis not present

## 2023-01-17 ENCOUNTER — Ambulatory Visit (INDEPENDENT_AMBULATORY_CARE_PROVIDER_SITE_OTHER): Payer: BC Managed Care – PPO

## 2023-01-17 DIAGNOSIS — J309 Allergic rhinitis, unspecified: Secondary | ICD-10-CM

## 2023-01-30 ENCOUNTER — Ambulatory Visit (INDEPENDENT_AMBULATORY_CARE_PROVIDER_SITE_OTHER): Payer: BC Managed Care – PPO

## 2023-01-30 DIAGNOSIS — J309 Allergic rhinitis, unspecified: Secondary | ICD-10-CM

## 2023-02-09 ENCOUNTER — Ambulatory Visit (INDEPENDENT_AMBULATORY_CARE_PROVIDER_SITE_OTHER): Payer: BC Managed Care – PPO

## 2023-02-09 DIAGNOSIS — J309 Allergic rhinitis, unspecified: Secondary | ICD-10-CM | POA: Diagnosis not present

## 2023-02-15 ENCOUNTER — Ambulatory Visit: Payer: Self-pay

## 2023-03-01 ENCOUNTER — Ambulatory Visit (INDEPENDENT_AMBULATORY_CARE_PROVIDER_SITE_OTHER): Payer: BC Managed Care – PPO

## 2023-03-01 DIAGNOSIS — J309 Allergic rhinitis, unspecified: Secondary | ICD-10-CM | POA: Diagnosis not present

## 2023-03-07 ENCOUNTER — Other Ambulatory Visit: Payer: Self-pay | Admitting: Family Medicine

## 2023-03-15 ENCOUNTER — Ambulatory Visit (INDEPENDENT_AMBULATORY_CARE_PROVIDER_SITE_OTHER): Payer: BC Managed Care – PPO

## 2023-03-15 DIAGNOSIS — J309 Allergic rhinitis, unspecified: Secondary | ICD-10-CM

## 2023-03-21 NOTE — Progress Notes (Signed)
EXP 03/22/24

## 2023-03-23 DIAGNOSIS — J3089 Other allergic rhinitis: Secondary | ICD-10-CM | POA: Diagnosis not present

## 2023-04-03 ENCOUNTER — Ambulatory Visit (INDEPENDENT_AMBULATORY_CARE_PROVIDER_SITE_OTHER): Payer: Self-pay

## 2023-04-03 DIAGNOSIS — J309 Allergic rhinitis, unspecified: Secondary | ICD-10-CM | POA: Diagnosis not present

## 2023-05-04 ENCOUNTER — Ambulatory Visit (INDEPENDENT_AMBULATORY_CARE_PROVIDER_SITE_OTHER): Payer: BC Managed Care – PPO | Admitting: *Deleted

## 2023-05-04 DIAGNOSIS — J309 Allergic rhinitis, unspecified: Secondary | ICD-10-CM

## 2023-05-08 ENCOUNTER — Encounter: Payer: Self-pay | Admitting: Family

## 2023-05-08 ENCOUNTER — Ambulatory Visit (INDEPENDENT_AMBULATORY_CARE_PROVIDER_SITE_OTHER): Payer: BC Managed Care – PPO | Admitting: Family

## 2023-05-08 ENCOUNTER — Other Ambulatory Visit: Payer: Self-pay

## 2023-05-08 ENCOUNTER — Ambulatory Visit: Payer: BC Managed Care – PPO | Admitting: Family Medicine

## 2023-05-08 VITALS — BP 120/70 | HR 88 | Temp 97.9°F | Resp 20 | Ht 65.0 in | Wt 169.5 lb

## 2023-05-08 DIAGNOSIS — J4540 Moderate persistent asthma, uncomplicated: Secondary | ICD-10-CM | POA: Diagnosis not present

## 2023-05-08 DIAGNOSIS — J302 Other seasonal allergic rhinitis: Secondary | ICD-10-CM

## 2023-05-08 DIAGNOSIS — J3089 Other allergic rhinitis: Secondary | ICD-10-CM | POA: Diagnosis not present

## 2023-05-08 DIAGNOSIS — J0100 Acute maxillary sinusitis, unspecified: Secondary | ICD-10-CM | POA: Diagnosis not present

## 2023-05-08 DIAGNOSIS — H1013 Acute atopic conjunctivitis, bilateral: Secondary | ICD-10-CM

## 2023-05-08 DIAGNOSIS — K219 Gastro-esophageal reflux disease without esophagitis: Secondary | ICD-10-CM

## 2023-05-08 MED ORDER — AMOXICILLIN-POT CLAVULANATE 875-125 MG PO TABS: 1.0000 | ORAL_TABLET | Freq: Two times a day (BID) | ORAL | 0 refills | Status: DC

## 2023-05-08 NOTE — Progress Notes (Signed)
[address] [place] [address] Dept: [phone]  FOLLOW UP NOTE  Patient ID: Bonnie Lowe, female    DOB: [birth year]  Age: 52 y.o. MRN: [id number] Date of Office Visit: 05/08/2023  Assessment  Chief Complaint: Follow-up (6 month visit)  HPI Bonnie Lowe is a 52 year old female who presents today for follow-up of bilateral ear pain, moderate persistent asthma without complication, seasonal and perennial allergic rhinitis, allergic conjunctivitis, and gastroesophageal reflux disease.  She was last seen on Oct 19, 2022 by myself.  She denies any new diagnosis or surgeries since her last office visit.  She does however mention though that approximately a month ago she got bit by a tick and her glands swelled in her neck.  She wonders if the tick bite caused this.  She does not feel like her neck is back to baseline, but mentions that it is better.    She reports 2-1/2 weeks ago she woke up with the glands in her neck bothering her in the right side of her neck became swollen and hurt to touch.  She reports that from where she travels so much she had 5 days worth of Augmentin and prednisone 20 mg on hand with her that she started on November 11.  She started feeling better, but mentions that she never got rid of the cough and it went into her chest.  Her cough is productive with yellow sputum and has been discolored since November 11.  She is also starting to wheeze and reports a little bit of burning/tightness in her chest and shortness of breath.  She denies nocturnal awakenings due to breathing problems, fever, chills, and body aches.  She reports that she did a COVID test this morning and it was negative.  When she started feeling bad on November 11 she started taking Symbicort 160/4.5 mcg 2 puffs 2-3 times a day.  Discussed that she needs to take her Symbicort 2 puffs twice a day rather than 3 times a day.  She is not taking montelukast 10 mg regularly.  She has added on Spiriva Respimat  1.25 mcg during flares.  She is doing 1 puff twice a day of Spiriva.  Discussed how it is prescribed to be used as 2 puffs once a day.  She reports that she no longer sees pulmonology due to the evaluation for prior asbestos exposure.  She reports that there was no need for her to come back to pulmology.  Allergic rhinitis/allergic conjunctivitis: She reports rhinorrhea that she is unsure of the color.  She also has postnasal drip.  She denies nasal congestion.  She is currently not taking any antihistamines.  She is currently using azelastine/fluticasone nasal spray as needed, but using it more consistently since November 11.  She continues to receive allergy injections per protocol and denies any problems or reactions with her allergy injections.  She just bought Mucinex last night.  Reflux is reported as fine.  She reports years ago she had [employer] fundoplication.  She is not taking omeprazole. She denies heartburn or reflux symptoms.  Discussed how cough could be due to silent reflux.  She is willing to try a 30-day trial of omeprazole to see if this helps with cough.    Drug Allergies:  Allergies  Allergen Reactions   Moxifloxacin Hcl Itching   Cefdinir Hives and Diarrhea   Citrus Diarrhea   Erythromycin    Pineapple Other (See Comments)   Sulfa Antibiotics     Review of  Systems: Negative except as per HPI   Physical Exam: BP 120/70 (BP Location: Left Arm, Patient Position: Sitting, Cuff Size: Normal)   Pulse 88   Temp 97.9 F (36.6 C) (Temporal)   Resp 20   Ht 5\' 5"  (1.651 m)   Wt 169 lb 8 oz (76.9 kg)   SpO2 98%   BMI 28.21 kg/m    Physical Exam Constitutional:      Appearance: Normal appearance.  HENT:     Head: Normocephalic and atraumatic.     Comments: Pharynx normal, eyes normal, ears normal, nose: Bilateral lower turbinates mildly edematous and slightly erythematous with clear drainage noted    Right Ear: Tympanic membrane, ear canal and external ear normal.      Left Ear: Tympanic membrane, ear canal and external ear normal.     Mouth/Throat:     Mouth: Mucous membranes are moist.     Pharynx: Oropharynx is clear.  Eyes:     Conjunctiva/sclera: Conjunctivae normal.  Cardiovascular:     Rate and Rhythm: Regular rhythm.     Heart sounds: Normal heart sounds.  Pulmonary:     Effort: Pulmonary effort is normal.     Breath sounds: Normal breath sounds.     Comments: Lungs clear to auscultation Skin:    General: Skin is warm.  Neurological:     Mental Status: She is alert and oriented to person, place, and time.  Psychiatric:        Mood and Affect: Mood normal.        Behavior: Behavior normal.        Thought Content: Thought content normal.        Judgment: Judgment normal.     Diagnostics: FVC 3.14 L (91%), FEV1 2.56 L (94%), FEV1/FVC 0.82.  Predicted FVC 3.44 L, predicted FEV1 2.73 L.  Spirometry indicates normal respiratory function.  Assessment and Plan: 1. Acute non-recurrent maxillary sinusitis   2. Moderate persistent asthma without complication   3. Seasonal and perennial allergic rhinitis   4. Allergic conjunctivitis of both eyes   5. Gastroesophageal reflux disease, unspecified whether esophagitis present     Meds ordered this encounter  Medications   amoxicillin-clavulanate (AUGMENTIN) 875-125 MG tablet    Sig: Take 1 tablet by mouth 2 (two) times daily.    Dispense:  14 tablet    Refill:  0    Patient Instructions  Asthma Continue montelukast 10 mg once a day to prevent cough or wheeze Continue Symbicort 1604.5 mcg-2 puffs once or twice a day with a spacer to prevent cough or wheeze.  Make sure and use this medication every day. Rinse mouth out after Continue albuterol 2 puffs every 4 hours as needed for cough or wheeze OR Instead use albuterol 0.083% solution via nebulizer one unit vial every 4 hours as needed for cough or wheeze For asthma flare and NOW, begin Spiriva 1.25 mcg 2 puffs once a day for 2 weeks or until  cough and wheeze free Consider Xolair injections if needed for asthma control  Asthma control goals:  Full participation in all desired activities (may need albuterol before activity) Albuterol use two time or less a week on average (not counting use with activity) Cough interfering with sleep two time or less a month Oral steroids no more than once a year No hospitalizations   Cough Resolved Referred to pulmonology at previous visit for evaluation of prior asbestos exposure  Allergic rhinitis/allergic conjunctivitis Start Augmentin 875 mg taking 1 tablet  twice a day for 7 days for possible sinus infection. Let us know if you are not getting better Continue allergen avoidance measures directed toward mold, weed pollen, tree pollen, dust mite, and dog as listed below Continue allergen immunotherapy and have access to an epinephrine autoinjector set per protocol Continue azelastine/fluticasone nasal spray 2 sprays in each nostril up to twice a day as needed for nasal symptoms Continue ipratropium 2 sprays in each nostril up to twice a day as needed for a runny nose Continue cetirizine 10 mg once a day as needed for runny nose Consider Mucinex 600 to 1200 mg twice a day as needed for nasal congestion Consider saline nasal rinses as needed for nasal symptoms. Use this before any medicated nasal sprays for best result  Reflux Continue dietary and lifestyle modifications as listed below Continue omeprazole once a day to control reflux. Take this medication 30 minutes before your first meal for best result. Lets try a 30 day trial of this medication to see if this helps with your cough  Schedule an appointment with your primary care physician to discuss your previous tick bite and enlargement of neck that occurred Call the clinic if this treatment plan is not working well for you.  Follow up in 4-6 weeks or sooner if needed.  Reducing Pollen Exposure The American Academy of Allergy,  Asthma and Immunology suggests the following steps to reduce your exposure to pollen during allergy seasons. Do not hang sheets or clothing out to dry; pollen may collect on these items. Do not mow lawns or spend time around freshly cut grass; mowing stirs up pollen. Keep windows closed at night.  Keep car windows closed while driving. Minimize morning activities outdoors, a time when pollen counts are usually at their highest. Stay indoors as much as possible when pollen counts or humidity is high and on windy days when pollen tends to remain in the air longer. Use air conditioning when possible.  Many air conditioners have filters that trap the pollen spores. Use a HEPA room air filter to remove pollen form the indoor air you breathe.  Control of Mold Allergen Mold and fungi can grow on a variety of surfaces provided certain temperature and moisture conditions exist.  Outdoor molds grow on plants, decaying vegetation and soil.  The major outdoor mold, Alternaria and Cladosporium, are found in very high numbers during hot and dry conditions.  Generally, a late Summer - Fall peak is seen for common outdoor fungal spores.  Rain will temporarily lower outdoor mold spore count, but counts rise rapidly when the rainy period ends.  The most important indoor molds are Aspergillus and Penicillium.  Dark, humid and poorly ventilated basements are ideal sites for mold growth.  The next most common sites of mold growth are the bathroom and the kitchen.  Outdoor [employer] Use air conditioning and keep windows closed Avoid exposure to decaying vegetation. Avoid leaf raking. Avoid grain handling. Consider wearing a face mask if working in moldy areas.  Indoor Mold Control Maintain humidity below 50%. Clean washable surfaces with 5% bleach solution. Remove sources e.g. Contaminated carpets.   Control of Dust Mite Allergen Dust mites play a major role in allergic asthma and rhinitis. They occur in  environments with high humidity wherever human skin is found. Dust mites absorb humidity from the atmosphere (ie, they do not drink) and feed on organic matter (including shed human and animal skin). Dust mites are a microscopic type of insect that you  cannot see with the naked eye. High levels of dust mites have been detected from mattresses, pillows, carpets, upholstered furniture, bed covers, clothes, soft toys and any woven material. The principal allergen of the dust mite is found in its feces. A gram of dust may contain 1,000 mites and 250,000 fecal particles. Mite antigen is easily measured in the air during house cleaning activities. Dust mites do not bite and do not cause harm to humans, other than by triggering allergies/asthma.  Ways to decrease your exposure to dust mites in your home:  1. Encase mattresses, box springs and pillows with a mite-impermeable barrier or cover  2. Wash sheets, blankets and drapes weekly in hot water (130 F) with detergent and dry them in a dryer on the hot setting.  3. Have the room cleaned frequently with a vacuum cleaner and a damp dust-mop. For carpeting or rugs, vacuuming with a vacuum cleaner equipped with a high-efficiency particulate air (HEPA) filter. The dust mite allergic individual should not be in a room which is being cleaned and should wait 1 hour after cleaning before going into the room.  4. Do not sleep on upholstered furniture (eg, couches).  5. If possible removing carpeting, upholstered furniture and drapery from the home is ideal. Horizontal blinds should be eliminated in the rooms where the person spends the most time (bedroom, study, television room). Washable vinyl, roller-type shades are optimal.  6. Remove all non-washable stuffed toys from the bedroom. Wash stuffed toys weekly like sheets and blankets above.  7. Reduce indoor humidity to less than 50%. Inexpensive humidity monitors can be purchased at most hardware stores. Do not  use a humidifier as can make the problem worse and are not recommended.  Control of Dog or Cat Allergen Avoidance is the best way to manage a dog or cat allergy. If you have a dog or cat and are allergic to dog or cats, consider removing the dog or cat from the home. If you have a dog or cat but don't want to find it a new home, or if your family wants a pet even though someone in the household is allergic, here are some strategies that may help keep symptoms at bay:  Keep the pet out of your bedroom and restrict it to only a few rooms. Be advised that keeping the dog or cat in only one room will not limit the allergens to that room. Don't pet, hug or kiss the dog or cat; if you do, wash your hands with soap and water. High-efficiency particulate air (HEPA) cleaners run continuously in a bedroom or living room can reduce allergen levels over time. Regular use of a high-efficiency vacuum cleaner or a central vacuum can reduce allergen levels. Giving your dog or cat a bath at least once a week can reduce airborne allergen.  No follow-ups on file.    Thank you for the opportunity to care for this patient.  Please do not hesitate to contact me with questions.  Nehemiah Settle, FNP [facility]

## 2023-05-08 NOTE — Patient Instructions (Addendum)
Asthma Continue montelukast 10 mg once a day to prevent cough or wheeze Continue Symbicort 160,4.5 mcg-2 puffs once or twice a day with a spacer to prevent cough or wheeze.  Make sure and use this medication every day. Rinse mouth out after Continue albuterol 2 puffs every 4 hours as needed for cough or wheeze OR Instead use albuterol 0.083% solution via nebulizer one unit vial every 4 hours as needed for cough or wheeze For asthma flare and NOW, begin Spiriva 1.25 mcg 2 puffs once a day for 2 weeks or until cough and wheeze free Consider Xolair injections if needed for asthma control  Asthma control goals:  Full participation in all desired activities (may need albuterol before activity) Albuterol use two time or less a week on average (not counting use with activity) Cough interfering with sleep two time or less a month Oral steroids no more than once a year No hospitalizations   Cough Resolved Referred to pulmonology at previous visit for evaluation of prior asbestos exposure  Allergic rhinitis/allergic conjunctivitis Start Augmentin 875 mg taking 1 tablet twice a day for 7 days for possible sinus infection. Let us know if you are not getting better Continue allergen avoidance measures directed toward mold, weed pollen, tree pollen, dust mite, and dog as listed below Continue allergen immunotherapy and have access to an epinephrine autoinjector set per protocol Continue azelastine/fluticasone nasal spray 2 sprays in each nostril up to twice a day as needed for nasal symptoms Continue ipratropium 2 sprays in each nostril up to twice a day as needed for a runny nose Continue cetirizine 10 mg once a day as needed for runny nose Consider Mucinex 600 to 1200 mg twice a day as needed for nasal congestion Consider saline nasal rinses as needed for nasal symptoms. Use this before any medicated nasal sprays for best result  Reflux Continue dietary and lifestyle modifications as listed  below Continue omeprazole once a day to control reflux. Take this medication 30 minutes before your first meal for best result. Lets try a 30 day trial of this medication to see if this helps with your cough  Schedule an appointment with your primary care physician to discuss your previous tick bite and enlargement of neck that occurred Call the clinic if this treatment plan is not working well for you.  Follow up in 4-6 weeks or sooner if needed.  Reducing Pollen Exposure The American Academy of Allergy, Asthma and Immunology suggests the following steps to reduce your exposure to pollen during allergy seasons. Do not hang sheets or clothing out to dry; pollen may collect on these items. Do not mow lawns or spend time around freshly cut grass; mowing stirs up pollen. Keep windows closed at night.  Keep car windows closed while driving. Minimize morning activities outdoors, a time when pollen counts are usually at their highest. Stay indoors as much as possible when pollen counts or humidity is high and on windy days when pollen tends to remain in the air longer. Use air conditioning when possible.  Many air conditioners have filters that trap the pollen spores. Use a HEPA room air filter to remove pollen form the indoor air you breathe.  Control of Mold Allergen Mold and fungi can grow on a variety of surfaces provided certain temperature and moisture conditions exist.  Outdoor molds grow on plants, decaying vegetation and soil.  The major outdoor mold, Alternaria and Cladosporium, are found in very high numbers during hot and dry conditions.  Generally, a late Summer - Fall peak is seen for common outdoor fungal spores.  Rain will temporarily lower outdoor mold spore count, but counts rise rapidly when the rainy period ends.  The most important indoor molds are Aspergillus and Penicillium.  Dark, humid and poorly ventilated basements are ideal sites for mold growth.  The next most common sites  of mold growth are the bathroom and the kitchen.  Outdoor [employer] Use air conditioning and keep windows closed Avoid exposure to decaying vegetation. Avoid leaf raking. Avoid grain handling. Consider wearing a face mask if working in moldy areas.  Indoor Mold Control Maintain humidity below 50%. Clean washable surfaces with 5% bleach solution. Remove sources e.g. Contaminated carpets.   Control of Dust Mite Allergen Dust mites play a major role in allergic asthma and rhinitis. They occur in environments with high humidity wherever human skin is found. Dust mites absorb humidity from the atmosphere (ie, they do not drink) and feed on organic matter (including shed human and animal skin). Dust mites are a microscopic type of insect that you cannot see with the naked eye. High levels of dust mites have been detected from mattresses, pillows, carpets, upholstered furniture, bed covers, clothes, soft toys and any woven material. The principal allergen of the dust mite is found in its feces. A gram of dust may contain 1,000 mites and 250,000 fecal particles. Mite antigen is easily measured in the air during house cleaning activities. Dust mites do not bite and do not cause harm to humans, other than by triggering allergies/asthma.  Ways to decrease your exposure to dust mites in your home:  1. Encase mattresses, box springs and pillows with a mite-impermeable barrier or cover  2. Wash sheets, blankets and drapes weekly in hot water (130 F) with detergent and dry them in a dryer on the hot setting.  3. Have the room cleaned frequently with a vacuum cleaner and a damp dust-mop. For carpeting or rugs, vacuuming with a vacuum cleaner equipped with a high-efficiency particulate air (HEPA) filter. The dust mite allergic individual should not be in a room which is being cleaned and should wait 1 hour after cleaning before going into the room.  4. Do not sleep on upholstered furniture (eg,  couches).  5. If possible removing carpeting, upholstered furniture and drapery from the home is ideal. Horizontal blinds should be eliminated in the rooms where the person spends the most time (bedroom, study, television room). Washable vinyl, roller-type shades are optimal.  6. Remove all non-washable stuffed toys from the bedroom. Wash stuffed toys weekly like sheets and blankets above.  7. Reduce indoor humidity to less than 50%. Inexpensive humidity monitors can be purchased at most hardware stores. Do not use a humidifier as can make the problem worse and are not recommended.  Control of Dog or Cat Allergen Avoidance is the best way to manage a dog or cat allergy. If you have a dog or cat and are allergic to dog or cats, consider removing the dog or cat from the home. If you have a dog or cat but don't want to find it a new home, or if your family wants a pet even though someone in the household is allergic, here are some strategies that may help keep symptoms at bay:  Keep the pet out of your bedroom and restrict it to only a few rooms. Be advised that keeping the dog or cat in only one room will not limit the allergens to  that room. Don't pet, hug or kiss the dog or cat; if you do, wash your hands with soap and water. High-efficiency particulate air (HEPA) cleaners run continuously in a bedroom or living room can reduce allergen levels over time. Regular use of a high-efficiency vacuum cleaner or a central vacuum can reduce allergen levels. Giving your dog or cat a bath at least once a week can reduce airborne allergen.

## 2023-05-29 ENCOUNTER — Ambulatory Visit: Payer: BC Managed Care – PPO | Admitting: Family Medicine

## 2023-05-31 ENCOUNTER — Telehealth: Payer: Self-pay

## 2023-05-31 ENCOUNTER — Ambulatory Visit (HOSPITAL_BASED_OUTPATIENT_CLINIC_OR_DEPARTMENT_OTHER)
Admission: RE | Admit: 2023-05-31 | Discharge: 2023-05-31 | Disposition: A | Discharge status: Home / Self Care | Payer: BC Managed Care – PPO | Source: Ambulatory Visit | Attending: Family | Admitting: Family

## 2023-05-31 ENCOUNTER — Ambulatory Visit (INDEPENDENT_AMBULATORY_CARE_PROVIDER_SITE_OTHER): Payer: BC Managed Care – PPO | Admitting: Family

## 2023-05-31 ENCOUNTER — Encounter: Payer: Self-pay | Admitting: Family

## 2023-05-31 VITALS — BP 126/80 | HR 92 | Temp 98.1°F | Resp 12

## 2023-05-31 DIAGNOSIS — J4540 Moderate persistent asthma, uncomplicated: Secondary | ICD-10-CM

## 2023-05-31 DIAGNOSIS — R058 Other specified cough: Secondary | ICD-10-CM | POA: Insufficient documentation

## 2023-05-31 DIAGNOSIS — Z8619 Personal history of other infectious and parasitic diseases: Secondary | ICD-10-CM

## 2023-05-31 DIAGNOSIS — J3089 Other allergic rhinitis: Secondary | ICD-10-CM | POA: Diagnosis not present

## 2023-05-31 DIAGNOSIS — H1013 Acute atopic conjunctivitis, bilateral: Secondary | ICD-10-CM

## 2023-05-31 DIAGNOSIS — K219 Gastro-esophageal reflux disease without esophagitis: Secondary | ICD-10-CM

## 2023-05-31 DIAGNOSIS — J302 Other seasonal allergic rhinitis: Secondary | ICD-10-CM

## 2023-05-31 MED ORDER — ALBUTEROL SULFATE (2.5 MG/3ML) 0.083% IN NEBU: 2.5000 mg | INHALATION_SOLUTION | RESPIRATORY_TRACT | 1 refills | Status: DC | PRN

## 2023-05-31 MED ORDER — PREDNISONE 10 MG PO TABS: ORAL_TABLET | ORAL | 0 refills | Status: DC

## 2023-05-31 NOTE — Telephone Encounter (Signed)
Please call Bonnie Lowe and see if she is able to get the chest x-ray today as I ordered.

## 2023-05-31 NOTE — Patient Instructions (Addendum)
Asthma-not well controlled Continue azithromycin as prescribed by urgent care Continue montelukast 10 mg once a day to prevent cough or wheeze Continue Symbicort 160/4.5 mcg-2 puffs  twice a day with a spacer to prevent cough or wheeze.  Make sure and use this medication every day. Rinse mouth out after Continue albuterol 2 puffs every 4 hours as needed for cough or wheeze OR Instead use albuterol 0.083% solution via nebulizer one unit vial every 4 hours as needed for cough or wheeze For asthma flare and NOW, begin Spiriva 1.25 mcg 2 puffs once a day for 2 weeks or until cough and wheeze free Recommend Xolair injections if needed for asthma control  Asthma control goals:  Full participation in all desired activities (may need albuterol before activity) Albuterol use two time or less a week on average (not counting use with activity) Cough interfering with sleep two time or less a month Oral steroids no more than once a year No hospitalizations    Chronic cough Will order STAT chest x-ray due to chronic cough. We will call you with results once they are back Referred to pulmonology at previous visit for evaluation of prior asbestos exposure  Allergic rhinitis/allergic conjunctivitis -I will order lab work to check your immune system due to your frequent antibiotics. We will call you with results once they are back - Start prednisone 10 mg twice a day for 5 days and then stop Continue allergen avoidance measures directed toward mold, weed pollen, tree pollen, dust mite, and dog as listed below Continue allergen immunotherapy and have access to an epinephrine autoinjector set per protocol Decrease azelastine nasal spray 2 sprays in each nostril up to twice a day as needed for nasal symptoms Decrease ipratropium 2 sprays in each nostril up to twice a day as needed for a runny nose Continue cetirizine 10 mg once a day as needed for runny nose Consider Mucinex 600 to 1200 mg twice a day as  needed for nasal congestion Consider saline nasal rinses as needed for nasal symptoms. Use this before any medicated nasal sprays for best result  Reflux Continue dietary and lifestyle modifications as listed below Continue omeprazole once a day to control reflux.   Call the clinic if this treatment plan is not working well for you.  Follow up in  6 weeks with Dr. Maurine Minister or Dr. Marlynn Perking or sooner if needed.  Reducing Pollen Exposure The American Academy of Allergy, Asthma and Immunology suggests the following steps to reduce your exposure to pollen during allergy seasons. Do not hang sheets or clothing out to dry; pollen may collect on these items. Do not mow lawns or spend time around freshly cut grass; mowing stirs up pollen. Keep windows closed at night.  Keep car windows closed while driving. Minimize morning activities outdoors, a time when pollen counts are usually at their highest. Stay indoors as much as possible when pollen counts or humidity is high and on windy days when pollen tends to remain in the air longer. Use air conditioning when possible.  Many air conditioners have filters that trap the pollen spores. Use a HEPA room air filter to remove pollen form the indoor air you breathe.  Control of Mold Allergen Mold and fungi can grow on a variety of surfaces provided certain temperature and moisture conditions exist.  Outdoor molds grow on plants, decaying vegetation and soil.  The major outdoor mold, Alternaria and Cladosporium, are found in very high numbers during hot and dry conditions.  Generally, a late Summer - Fall peak is seen for common outdoor fungal spores.  Rain will temporarily lower outdoor mold spore count, but counts rise rapidly when the rainy period ends.  The most important indoor molds are Aspergillus and Penicillium.  Dark, humid and poorly ventilated basements are ideal sites for mold growth.  The next most common sites of mold growth are the bathroom and the  kitchen.  Outdoor [employer] Use air conditioning and keep windows closed Avoid exposure to decaying vegetation. Avoid leaf raking. Avoid grain handling. Consider wearing a face mask if working in moldy areas.  Indoor Mold Control Maintain humidity below 50%. Clean washable surfaces with 5% bleach solution. Remove sources e.g. Contaminated carpets.   Control of Dust Mite Allergen Dust mites play a major role in allergic asthma and rhinitis. They occur in environments with high humidity wherever human skin is found. Dust mites absorb humidity from the atmosphere (ie, they do not drink) and feed on organic matter (including shed human and animal skin). Dust mites are a microscopic type of insect that you cannot see with the naked eye. High levels of dust mites have been detected from mattresses, pillows, carpets, upholstered furniture, bed covers, clothes, soft toys and any woven material. The principal allergen of the dust mite is found in its feces. A gram of dust may contain 1,000 mites and 250,000 fecal particles. Mite antigen is easily measured in the air during house cleaning activities. Dust mites do not bite and do not cause harm to humans, other than by triggering allergies/asthma.  Ways to decrease your exposure to dust mites in your home:  1. Encase mattresses, box springs and pillows with a mite-impermeable barrier or cover  2. Wash sheets, blankets and drapes weekly in hot water (130 F) with detergent and dry them in a dryer on the hot setting.  3. Have the room cleaned frequently with a vacuum cleaner and a damp dust-mop. For carpeting or rugs, vacuuming with a vacuum cleaner equipped with a high-efficiency particulate air (HEPA) filter. The dust mite allergic individual should not be in a room which is being cleaned and should wait 1 hour after cleaning before going into the room.  4. Do not sleep on upholstered furniture (eg, couches).  5. If possible removing carpeting,  upholstered furniture and drapery from the home is ideal. Horizontal blinds should be eliminated in the rooms where the person spends the most time (bedroom, study, television room). Washable vinyl, roller-type shades are optimal.  6. Remove all non-washable stuffed toys from the bedroom. Wash stuffed toys weekly like sheets and blankets above.  7. Reduce indoor humidity to less than 50%. Inexpensive humidity monitors can be purchased at most hardware stores. Do not use a humidifier as can make the problem worse and are not recommended.  Control of Dog or Cat Allergen Avoidance is the best way to manage a dog or cat allergy. If you have a dog or cat and are allergic to dog or cats, consider removing the dog or cat from the home. If you have a dog or cat but don't want to find it a new home, or if your family wants a pet even though someone in the household is allergic, here are some strategies that may help keep symptoms at bay:  Keep the pet out of your bedroom and restrict it to only a few rooms. Be advised that keeping the dog or cat in only one room will not limit the allergens to  that room. Don't pet, hug or kiss the dog or cat; if you do, wash your hands with soap and water. High-efficiency particulate air (HEPA) cleaners run continuously in a bedroom or living room can reduce allergen levels over time. Regular use of a high-efficiency vacuum cleaner or a central vacuum can reduce allergen levels. Giving your dog or cat a bath at least once a week can reduce airborne allergen.

## 2023-05-31 NOTE — Telephone Encounter (Signed)
Pt had to run by work to do something but she is on the way to get the chest xray now

## 2023-05-31 NOTE — Progress Notes (Signed)
[address] [place] [address] Dept: [phone]  FOLLOW UP NOTE  Patient ID: Bonnie Lowe, female    DOB: [birth year]  Age: 52 y.o. MRN: [id number] Date of Office Visit: 05/31/2023  Assessment  Chief Complaint: Cough (Coughing since the beginning of November. She is currently on her third antibiotic. She was given z-pack 2 days ago by urgent care, but no prednisone)  HPI Bonnie Lowe is a 52 year old female who presents today for an acute visit cough and sinus pressure.  She was last seen on May 08, 2023 by myself for acute nonrecurrent maxillary sinusitis, moderate persistent asthma without complication, seasonal and perennial allergic rhinitis, allergic conjunctivitis, gastroesophageal reflux disease.  She denies any new surgeries since her last office visit.  She reports that early November a gland in her right neck was swollen, but her throat did not hurt.  She was going to [place] and had some antibiotics on hand that she took.  During that time she also developed a cough.  She got better, but the cough still lingered.  Then the day after Thanksgiving she felt bad and was given the same antibiotic by our office but for a longer duration.  Her symptoms got better but she still had a cough.  Then December 2 through 11 she went out of town for work.  For the past 2 days her cough has been getting worse.  She went to an urgent care in [place], [place] where she was given azithromycin.  She reports that she did not have a chest x-ray completed at urgent care.  Her cough is productive with yellow sputum.  The cough feels like it is getting deeper and it burns.  She will at times feel lightheaded from coughing.  She also reports wheezing, shortness of breath, and nocturnal awakenings due to breathing problems.  She denies tightness in chest, fever, and chills.  She reports that her mom told her and her brother that they never got fevers as children.  Since her last office visit she  has not required any systemic steroids.  She has made 1 trip to the urgent care 2 days ago since her last office visit she has not made any trips to the emergency room or urgent care due to breathing problems.  She has been using her albuterol 2 puffs twice a day recently and reports that this helps for short time.  She is also taking Symbicort 160/4.5 mcg 2 puffs twice a day with a spacer, Spiriva 2 inhalations once a day, and montelukast 10 mg at night.  She has not really thought about Xolair injections due to being busy at work.  She has never smoked cigarettes.  She reports that she can tolerate prednisone by mouth.  Allergic rhinitis/allergic conjunctivitis: She reports rhinorrhea, nasal congestion, and postnasal drip for the past 4 days.  She thinks what is coming out of her nose is clear in color, but she has not looked.  She also feels pressure in her head and ears.  She has not been treated for any sinus infections since we last saw her.  She is currently using ipratropium nasal spray 2 sprays each nostril 2-3 times a day, azelastine nasal spray 2 sprays each nostril 2-3 times a day, cetirizine 10 mg once a day, and allergy injections per protocol.  She does feel like her allergy injections help.  She reports last fall and winter she was healthy.  Reflux as reported as being better since taking omeprazole  once a day on most days.  She reports that there might be a day that she misses but she tries to take it consistently every day.     Drug Allergies:  Allergies  Allergen Reactions   Moxifloxacin Hcl Itching   Cefdinir Hives and Diarrhea   Citrus Diarrhea   Erythromycin    Pineapple Other (See Comments)   Sulfa Antibiotics    Sulfur Other (See Comments)    Review of Systems: Negative except as per HPI   Physical Exam: BP 126/80 (BP Location: Left Arm, Patient Position: Sitting, Cuff Size: Normal)   Pulse 92   Temp 98.1 F (36.7 C) (Temporal)   Resp 12   SpO2 99%    Physical  Exam Constitutional:      Appearance: Normal appearance.  HENT:     Head: Normocephalic and atraumatic.     Comments: Pharynx normal, eyes normal, ears normal, nose: Bilateral lower turbinates mildly edematous and erythematous with no drainage noted    Right Ear: Tympanic membrane, ear canal and external ear normal.     Left Ear: Tympanic membrane, ear canal and external ear normal.     Mouth/Throat:     Mouth: Mucous membranes are moist.     Pharynx: Oropharynx is clear.  Eyes:     Conjunctiva/sclera: Conjunctivae normal.  Cardiovascular:     Rate and Rhythm: Regular rhythm.     Heart sounds: Normal heart sounds.  Pulmonary:     Effort: Pulmonary effort is normal.     Breath sounds: Normal breath sounds.     Comments: Lungs clear to auscultation Musculoskeletal:     Cervical back: Neck supple.  Skin:    General: Skin is warm.  Neurological:     Mental Status: She is alert and oriented to person, place, and time.  Psychiatric:        Mood and Affect: Mood normal.        Behavior: Behavior normal.        Thought Content: Thought content normal.        Judgment: Judgment normal.     Diagnostics: FVC 3.76 L (109%), FEV1 2.67 L (98%), FEV1/FVC 0.71.  Spirometry indicates normal spirometry.  Assessment and Plan: 1. Frequent infections   2. Productive cough   3. Not well controlled moderate persistent asthma   4. Seasonal and perennial allergic rhinitis   5. Allergic conjunctivitis of both eyes   6. Gastroesophageal reflux disease, unspecified whether esophagitis present     Meds ordered this encounter  Medications   albuterol (PROVENTIL) (2.5 MG/3ML) 0.083% nebulizer solution    Sig: Take 3 mLs (2.5 mg total) by nebulization every 4 (four) hours as needed for wheezing or shortness of breath.    Dispense:  180 mL    Refill:  1   predniSONE (DELTASONE) 10 MG tablet    Sig: Take 1 tablet by mouth twice a day for 5 days    Dispense:  10 tablet    Refill:  0    Patient  Instructions  Asthma-not well controlled Continue azithromycin as prescribed by urgent care Continue montelukast 10 mg once a day to prevent cough or wheeze Continue Symbicort 160/4.5 mcg-2 puffs  twice a day with a spacer to prevent cough or wheeze.  Make sure and use this medication every day. Rinse mouth out after Continue albuterol 2 puffs every 4 hours as needed for cough or wheeze OR Instead use albuterol 0.083% solution via nebulizer one unit vial every  4 hours as needed for cough or wheeze For asthma flare and NOW, begin Spiriva 1.25 mcg 2 puffs once a day for 2 weeks or until cough and wheeze free Recommend Xolair injections if needed for asthma control  Asthma control goals:  Full participation in all desired activities (may need albuterol before activity) Albuterol use two time or less a week on average (not counting use with activity) Cough interfering with sleep two time or less a month Oral steroids no more than once a year No hospitalizations    Chronic cough Will order STAT chest x-ray due to chronic cough. We will call you with results once they are back Referred to pulmonology at previous visit for evaluation of prior asbestos exposure  Allergic rhinitis/allergic conjunctivitis -I will order lab work to check your immune system due to your frequent antibiotics. We will call you with results once they are back - Start prednisone 10 mg twice a day for 5 days and then stop Continue allergen avoidance measures directed toward mold, weed pollen, tree pollen, dust mite, and dog as listed below Continue allergen immunotherapy and have access to an epinephrine autoinjector set per protocol Decrease azelastine nasal spray 2 sprays in each nostril up to twice a day as needed for nasal symptoms Decrease ipratropium 2 sprays in each nostril up to twice a day as needed for a runny nose Continue cetirizine 10 mg once a day as needed for runny nose Consider Mucinex 600 to 1200 mg  twice a day as needed for nasal congestion Consider saline nasal rinses as needed for nasal symptoms. Use this before any medicated nasal sprays for best result  Reflux Continue dietary and lifestyle modifications as listed below Continue omeprazole once a day to control reflux.   Call the clinic if this treatment plan is not working well for you.  Follow up in  6 weeks with Dr. Maurine Minister or Dr. Marlynn Perking or sooner if needed.  Reducing Pollen Exposure The American Academy of Allergy, Asthma and Immunology suggests the following steps to reduce your exposure to pollen during allergy seasons. Do not hang sheets or clothing out to dry; pollen may collect on these items. Do not mow lawns or spend time around freshly cut grass; mowing stirs up pollen. Keep windows closed at night.  Keep car windows closed while driving. Minimize morning activities outdoors, a time when pollen counts are usually at their highest. Stay indoors as much as possible when pollen counts or humidity is high and on windy days when pollen tends to remain in the air longer. Use air conditioning when possible.  Many air conditioners have filters that trap the pollen spores. Use a HEPA room air filter to remove pollen form the indoor air you breathe.  Control of Mold Allergen Mold and fungi can grow on a variety of surfaces provided certain temperature and moisture conditions exist.  Outdoor molds grow on plants, decaying vegetation and soil.  The major outdoor mold, Alternaria and Cladosporium, are found in very high numbers during hot and dry conditions.  Generally, a late Summer - Fall peak is seen for common outdoor fungal spores.  Rain will temporarily lower outdoor mold spore count, but counts rise rapidly when the rainy period ends.  The most important indoor molds are Aspergillus and Penicillium.  Dark, humid and poorly ventilated basements are ideal sites for mold growth.  The next most common sites of mold growth are the  bathroom and the kitchen.  Outdoor [employer] Use air  conditioning and keep windows closed Avoid exposure to decaying vegetation. Avoid leaf raking. Avoid grain handling. Consider wearing a face mask if working in moldy areas.  Indoor Mold Control Maintain humidity below 50%. Clean washable surfaces with 5% bleach solution. Remove sources e.g. Contaminated carpets.   Control of Dust Mite Allergen Dust mites play a major role in allergic asthma and rhinitis. They occur in environments with high humidity wherever human skin is found. Dust mites absorb humidity from the atmosphere (ie, they do not drink) and feed on organic matter (including shed human and animal skin). Dust mites are a microscopic type of insect that you cannot see with the naked eye. High levels of dust mites have been detected from mattresses, pillows, carpets, upholstered furniture, bed covers, clothes, soft toys and any woven material. The principal allergen of the dust mite is found in its feces. A gram of dust may contain 1,000 mites and 250,000 fecal particles. Mite antigen is easily measured in the air during house cleaning activities. Dust mites do not bite and do not cause harm to humans, other than by triggering allergies/asthma.  Ways to decrease your exposure to dust mites in your home:  1. Encase mattresses, box springs and pillows with a mite-impermeable barrier or cover  2. Wash sheets, blankets and drapes weekly in hot water (130 F) with detergent and dry them in a dryer on the hot setting.  3. Have the room cleaned frequently with a vacuum cleaner and a damp dust-mop. For carpeting or rugs, vacuuming with a vacuum cleaner equipped with a high-efficiency particulate air (HEPA) filter. The dust mite allergic individual should not be in a room which is being cleaned and should wait 1 hour after cleaning before going into the room.  4. Do not sleep on upholstered furniture (eg, couches).  5. If possible  removing carpeting, upholstered furniture and drapery from the home is ideal. Horizontal blinds should be eliminated in the rooms where the person spends the most time (bedroom, study, television room). Washable vinyl, roller-type shades are optimal.  6. Remove all non-washable stuffed toys from the bedroom. Wash stuffed toys weekly like sheets and blankets above.  7. Reduce indoor humidity to less than 50%. Inexpensive humidity monitors can be purchased at most hardware stores. Do not use a humidifier as can make the problem worse and are not recommended.  Control of Dog or Cat Allergen Avoidance is the best way to manage a dog or cat allergy. If you have a dog or cat and are allergic to dog or cats, consider removing the dog or cat from the home. If you have a dog or cat but don't want to find it a new home, or if your family wants a pet even though someone in the household is allergic, here are some strategies that may help keep symptoms at bay:  Keep the pet out of your bedroom and restrict it to only a few rooms. Be advised that keeping the dog or cat in only one room will not limit the allergens to that room. Don't pet, hug or kiss the dog or cat; if you do, wash your hands with soap and water. High-efficiency particulate air (HEPA) cleaners run continuously in a bedroom or living room can reduce allergen levels over time. Regular use of a high-efficiency vacuum cleaner or a central vacuum can reduce allergen levels. Giving your dog or cat a bath at least once a week can reduce airborne allergen.  Return in about 6 weeks (  around 07/12/2023), or if symptoms worsen or fail to improve.    Thank you for the opportunity to care for this patient.  Please do not hesitate to contact me with questions.  Nehemiah Settle, FNP [facility]

## 2023-05-31 NOTE — Telephone Encounter (Signed)
Thank you :)

## 2023-05-31 NOTE — Telephone Encounter (Signed)
Called [facility] [phone] option 3 spoke to [facility] she states patient has not shown up yet for STAT chest xray

## 2023-06-01 NOTE — Progress Notes (Signed)
Please let  Bonnie Lowe know that her chest xray shows left basilar scar or atelectasis.  Also, incidentally it shows distended stomach beneath the left hemidiaphragm with a air fluid level. Is she having any abdominal symptoms? Recommend scheduling an appointment with her primary care physician to discuss. Please send a copy of her chest x-ray to her primary care physician.  Continue azithromycin as per urgent care

## 2023-06-04 ENCOUNTER — Other Ambulatory Visit: Payer: Self-pay | Admitting: Internal Medicine

## 2023-06-08 LAB — CBC WITH DIFFERENTIAL/PLATELET
Basophils Absolute: 0.1 10*3/uL (ref 0.0–0.2)
Basos: 1 %
EOS (ABSOLUTE): 0.1 10*3/uL (ref 0.0–0.4)
Eos: 1 %
Hematocrit: 40.3 % (ref 34.0–46.6)
Hemoglobin: 13 g/dL (ref 11.1–15.9)
Immature Grans (Abs): 0 10*3/uL (ref 0.0–0.1)
Immature Granulocytes: 0 %
Lymphocytes Absolute: 1.8 10*3/uL (ref 0.7–3.1)
Lymphs: 24 %
MCH: 27.8 pg (ref 26.6–33.0)
MCHC: 32.3 g/dL (ref 31.5–35.7)
MCV: 86 fL (ref 79–97)
Monocytes Absolute: 0.9 10*3/uL (ref 0.1–0.9)
Monocytes: 12 %
Neutrophils Absolute: 4.6 10*3/uL (ref 1.4–7.0)
Neutrophils: 62 %
Platelets: 271 10*3/uL (ref 150–450)
RBC: 4.67 x10E6/uL (ref 3.77–5.28)
RDW: 13.3 % (ref 11.7–15.4)
WBC: 7.5 10*3/uL (ref 3.4–10.8)

## 2023-06-08 LAB — STREP PNEUMONIAE 23 SEROTYPES IGG
Pneumo Ab Type 1*: 2.7 ug/mL (ref >1.3)
Pneumo Ab Type 12 (12F)*: 0.3 ug/mL — ABNORMAL LOW (ref >1.3)
Pneumo Ab Type 14*: <0.1 ug/mL — ABNORMAL LOW (ref >1.3)
Pneumo Ab Type 17 (17F)*: 5 ug/mL (ref >1.3)
Pneumo Ab Type 19 (19F)*: 1.1 ug/mL — ABNORMAL LOW (ref >1.3)
Pneumo Ab Type 2*: 2.5 ug/mL (ref >1.3)
Pneumo Ab Type 20*: 1.7 ug/mL (ref >1.3)
Pneumo Ab Type 22 (22F)*: 0.7 ug/mL — ABNORMAL LOW (ref >1.3)
Pneumo Ab Type 23 (23F)*: 0.3 ug/mL — ABNORMAL LOW (ref >1.3)
Pneumo Ab Type 26 (6B)*: 2.2 ug/mL (ref >1.3)
Pneumo Ab Type 3*: <0.1 ug/mL — ABNORMAL LOW (ref >1.3)
Pneumo Ab Type 34 (10A)*: 7 ug/mL (ref >1.3)
Pneumo Ab Type 4*: 0.1 ug/mL — ABNORMAL LOW (ref >1.3)
Pneumo Ab Type 43 (11A)*: <0.1 ug/mL — ABNORMAL LOW (ref >1.3)
Pneumo Ab Type 5*: 1 ug/mL — ABNORMAL LOW (ref >1.3)
Pneumo Ab Type 51 (7F)*: 0.3 ug/mL — ABNORMAL LOW (ref >1.3)
Pneumo Ab Type 54 (15B)*: 2.1 ug/mL (ref >1.3)
Pneumo Ab Type 56 (18C)*: 1 ug/mL — ABNORMAL LOW (ref >1.3)
Pneumo Ab Type 57 (19A)*: 1.6 ug/mL (ref >1.3)
Pneumo Ab Type 68 (9V)*: 0.4 ug/mL — ABNORMAL LOW (ref >1.3)
Pneumo Ab Type 70 (33F)*: 6.5 ug/mL (ref >1.3)
Pneumo Ab Type 8*: 5.9 ug/mL (ref >1.3)
Pneumo Ab Type 9 (9N)*: 0.5 ug/mL — ABNORMAL LOW (ref >1.3)

## 2023-06-08 LAB — DIPHTHERIA / TETANUS ANTIBODY PANEL
Diphtheria Ab: 0.32 [IU]/mL (ref <0.10)
Tetanus Ab, IgG: 0.95 [IU]/mL (ref <0.10)

## 2023-06-08 LAB — IGG, IGA, IGM
IgA/Immunoglobulin A, Serum: 78 mg/dL — ABNORMAL LOW (ref 87–352)
IgG (Immunoglobin G), Serum: 1036 mg/dL (ref 586–1602)
IgM (Immunoglobulin M), Srm: 60 mg/dL (ref 26–217)

## 2023-06-08 LAB — COMPLEMENT, TOTAL: Compl, Total (CH50): >60 U/mL (ref >41)

## 2023-06-12 ENCOUNTER — Ambulatory Visit: Payer: BC Managed Care – PPO | Admitting: Family Medicine

## 2023-06-13 NOTE — Patient Instructions (Addendum)
 Asthma- with acute exacerbation Depo Medrol  40 mg IM given today in office Starting tomorrow take prednisone  10 mg taking 2 tablets twice a day for 4 days Continue montelukast  10 mg once a day to prevent cough or wheeze Continue Symbicort  160/4.5 mcg-2 puffs  twice a day with a spacer to prevent cough or wheeze.  Make sure and use this medication every day. Rinse mouth out after Continue albuterol  2 puffs every 4 hours as needed for cough or wheeze OR Instead use albuterol  0.083% solution via nebulizer one unit vial every 4 hours as needed for cough or wheeze For asthma flare and NOW, begin Spiriva  1.25 mcg 2 puffs once a day for 2 weeks or until cough and wheeze free Recommend Xolair injections if needed for asthma control We will order a CT chest without contrast due to chronic cough  Asthma control goals:  Full participation in all desired activities (may need albuterol  before activity) Albuterol  use two time or less a week on average (not counting use with activity) Cough interfering with sleep two time or less a month Oral steroids no more than once a year No hospitalizations    Chronic cough Getting sinus and chest ct without contrast due to chronic cough  Allergic rhinitis/allergic conjunctivitis Continue allergen avoidance measures directed toward mold, weed pollen, tree pollen, dust mite, and dog as listed below Continue allergen immunotherapy and have access to an epinephrine  autoinjector set per protocol. Hold off on allergy injections for now due to cough Decrease azelastine  nasal spray 2 sprays in each nostril up to twice a day as needed for nasal symptoms Continue ipratropium 2 sprays in each nostril up to twice a day as needed for a runny nose Continue cetirizine  10 mg once a day as needed for runny nose Consider Mucinex 600 to 1200 mg twice a day as needed for nasal congestion Consider saline nasal rinses as needed for nasal symptoms. Use this before any medicated nasal  sprays for best result Will order sinus CT due to your continued symptoms.  We will call you with results once they are back Schedule to get your Pneumovax due to strep pneumonia titers being protective to 10 out of 23.  We will get lab work 4 to 6 weeks after you get your Pneumovax to see how you respond to the vaccine. Pneumovax given today in office.  Reflux Continue dietary and lifestyle modifications as listed below Continue omeprazole  once a day to control reflux.  Make sure and take this education every day. Will refer you back to GI due to abdominal bloating and reflux.  Our office will be in contact with you about this referral  If your symptoms do not get better or worsen please go to the emergency room Discussed that bloating is not a symptom of food allergy.  Also discussed how her cough and abdominal bloating could be due to gastrointestinal symptoms since she is not responding to traditional treatment for asthma Call the clinic if this treatment plan is not working well for you.  Keep already scheduled follow-up appointment on July 12, 2023 at 3 PM with Dr. Marinda or sooner if needed.  Reducing Pollen Exposure The American Academy of Allergy, Asthma and Immunology suggests the following steps to reduce your exposure to pollen during allergy seasons. Do not hang sheets or clothing out to dry; pollen may collect on these items. Do not mow lawns or spend time around freshly cut grass; mowing stirs up pollen. Keep windows closed at  night.  Keep car windows closed while driving. Minimize morning activities outdoors, a time when pollen counts are usually at their highest. Stay indoors as much as possible when pollen counts or humidity is high and on windy days when pollen tends to remain in the air longer. Use air conditioning when possible.  Many air conditioners have filters that trap the pollen spores. Use a HEPA room air filter to remove pollen form the indoor air you  breathe.  Control of Mold Allergen Mold and fungi can grow on a variety of surfaces provided certain temperature and moisture conditions exist.  Outdoor molds grow on plants, decaying vegetation and soil.  The major outdoor mold, Alternaria and Cladosporium, are found in very high numbers during hot and dry conditions.  Generally, a late Summer - Fall peak is seen for common outdoor fungal spores.  Rain will temporarily lower outdoor mold spore count, but counts rise rapidly when the rainy period ends.  The most important indoor molds are Aspergillus and Penicillium.  Dark, humid and poorly ventilated basements are ideal sites for mold growth.  The next most common sites of mold growth are the bathroom and the kitchen.  Outdoor [employer] Use air conditioning and keep windows closed Avoid exposure to decaying vegetation. Avoid leaf raking. Avoid grain handling. Consider wearing a face mask if working in moldy areas.  Indoor Mold Control Maintain humidity below 50%. Clean washable surfaces with 5% bleach solution. Remove sources e.g. Contaminated carpets.   Control of Dust Mite Allergen Dust mites play a major role in allergic asthma and rhinitis. They occur in environments with high humidity wherever human skin is found. Dust mites absorb humidity from the atmosphere (ie, they do not drink) and feed on organic matter (including shed human and animal skin). Dust mites are a microscopic type of insect that you cannot see with the naked eye. High levels of dust mites have been detected from mattresses, pillows, carpets, upholstered furniture, bed covers, clothes, soft toys and any woven material. The principal allergen of the dust mite is found in its feces. A gram of dust may contain 1,000 mites and 250,000 fecal particles. Mite antigen is easily measured in the air during house cleaning activities. Dust mites do not bite and do not cause harm to humans, other than by triggering  allergies/asthma.  Ways to decrease your exposure to dust mites in your home:  1. Encase mattresses, box springs and pillows with a mite-impermeable barrier or cover  2. Wash sheets, blankets and drapes weekly in hot water (130 F) with detergent and dry them in a dryer on the hot setting.  3. Have the room cleaned frequently with a vacuum cleaner and a damp dust-mop. For carpeting or rugs, vacuuming with a vacuum cleaner equipped with a high-efficiency particulate air (HEPA) filter. The dust mite allergic individual should not be in a room which is being cleaned and should wait 1 hour after cleaning before going into the room.  4. Do not sleep on upholstered furniture (eg, couches).  5. If possible removing carpeting, upholstered furniture and drapery from the home is ideal. Horizontal blinds should be eliminated in the rooms where the person spends the most time (bedroom, study, television room). Washable vinyl, roller-type shades are optimal.  6. Remove all non-washable stuffed toys from the bedroom. Wash stuffed toys weekly like sheets and blankets above.  7. Reduce indoor humidity to less than 50%. Inexpensive humidity monitors can be purchased at most hardware stores. Do not  use a humidifier as can make the problem worse and are not recommended.  Control of Dog or Cat Allergen Avoidance is the best way to manage a dog or cat allergy. If you have a dog or cat and are allergic to dog or cats, consider removing the dog or cat from the home. If you have a dog or cat but don't want to find it a new home, or if your family wants a pet even though someone in the household is allergic, here are some strategies that may help keep symptoms at bay:  Keep the pet out of your bedroom and restrict it to only a few rooms. Be advised that keeping the dog or cat in only one room will not limit the allergens to that room. Don't pet, hug or kiss the dog or cat; if you do, wash your hands with soap and  water. High-efficiency particulate air (HEPA) cleaners run continuously in a bedroom or living room can reduce allergen levels over time. Regular use of a high-efficiency vacuum cleaner or a central vacuum can reduce allergen levels. Giving your dog or cat a bath at least once a week can reduce airborne allergen.

## 2023-06-14 ENCOUNTER — Ambulatory Visit (INDEPENDENT_AMBULATORY_CARE_PROVIDER_SITE_OTHER): Payer: BC Managed Care – PPO | Admitting: Family

## 2023-06-14 ENCOUNTER — Encounter: Payer: Self-pay | Admitting: Family

## 2023-06-14 ENCOUNTER — Telehealth: Payer: Self-pay | Admitting: *Deleted

## 2023-06-14 VITALS — BP 130/90 | HR 92 | Temp 98.1°F | Resp 16

## 2023-06-14 DIAGNOSIS — R053 Chronic cough: Secondary | ICD-10-CM

## 2023-06-14 DIAGNOSIS — J302 Other seasonal allergic rhinitis: Secondary | ICD-10-CM

## 2023-06-14 DIAGNOSIS — J4541 Moderate persistent asthma with (acute) exacerbation: Secondary | ICD-10-CM

## 2023-06-14 DIAGNOSIS — B999 Unspecified infectious disease: Secondary | ICD-10-CM

## 2023-06-14 DIAGNOSIS — J3089 Other allergic rhinitis: Secondary | ICD-10-CM

## 2023-06-14 DIAGNOSIS — K219 Gastro-esophageal reflux disease without esophagitis: Secondary | ICD-10-CM

## 2023-06-14 DIAGNOSIS — Z23 Encounter for immunization: Secondary | ICD-10-CM

## 2023-06-14 DIAGNOSIS — H1013 Acute atopic conjunctivitis, bilateral: Secondary | ICD-10-CM

## 2023-06-14 MED ORDER — PREDNISONE 10 MG PO TABS: ORAL_TABLET | ORAL | 0 refills | Status: DC

## 2023-06-14 MED ORDER — MONTELUKAST SODIUM 10 MG PO TABS: 10.0000 mg | ORAL_TABLET | Freq: Every day | ORAL | 1 refills | Status: DC

## 2023-06-14 MED ORDER — METHYLPREDNISOLONE ACETATE 40 MG/ML IJ SUSP
40.0000 mg | Freq: Once | INTRAMUSCULAR | Status: AC
  Administered 2023-06-14: 40 mg via INTRAMUSCULAR

## 2023-06-14 MED ORDER — METHYLPREDNISOLONE ACETATE 40 MG/ML IJ SUSP: 40.0000 mg | Freq: Once | INTRAMUSCULAR | Status: DC

## 2023-06-14 NOTE — Telephone Encounter (Signed)
 Called [facility] at [phone] for Chest CT and Sinus CT clinic questions answers and still requires a peer to peer [phone] reference # [id number]. Chrissie please advise.

## 2023-06-14 NOTE — Progress Notes (Signed)
 [address] [place] [address] Dept: [phone]  FOLLOW UP NOTE  Patient ID: Bonnie Lowe, female    DOB: [birth year]  Age: 53 y.o. MRN: [id number] Date of Office Visit: 06/14/2023  Assessment  Chief Complaint: Cough (Cough is worse, no better from last visit.)  HPI Bonnie Lowe is a 53 year old female who presents today for an acute visit of cough no better.  She was last seen by myself on May 31, 2023.  She denies any diagnosis or surgery since her last office visit.   She does mention that she went to her primary care physician since her last office due to her chest x-ray results that she had completed after her last office visit with us . She is having stomach bloating.  She was told to stay away from gluten and dairy.  She is interested in getting tested to dairy and gluten  for possible food allergy causing bloating.  She has not tried avoiding gluten or dairy. She was also given codeine cough syrup to take at night her her cough. She reports that it helped,but it made her sleepy and groggy the next day.  She reports that she cannot stop coughing and it is hard to breathe. She has to leave Monday to go out of town for 15 days for work. She wonders if she has dislocated a couple ribs from coughing so hard.  She maybe got 1 hour of sleep last night.  The cough is constant.   Her cough is productive with light yellow sputum.  She finished the Z-Pak given by urgent care and does not feel like it did anything. The  prednisone  prescribed at her last office visit may be helped a little.  She reports her dad has something similar and he was given doxycycline a couple days ago.  She wonders if she needs this antibiotic.  Discussed how she has already been on 3 rounds of antibiotics since November and that is best we proceed with imaging.  She also reports wheezing and albuterol  might help this.  She also reports shortness of breath.  It is hard to take a deep breath. She will also feel  lightheaded.  The cough keeps her up at night.  She denies tightness in her chest, fever, and chills.  She is using her albuterol  via nebulizer twice a day and her albuterol  inhaler 2-3 times a day.  Last night she felt like the cough is coming from her throat.  She is not taking montelukast  10 mg once a day because she is out.  She is taking Symbicort  160/4.5 mcg 2 puffs twice a day with spacer and Spiriva  1.25 mcg 2 puffs once a day.  The cough has been ongoing since November.  She reports that when she was on the previous antibiotics that the cough did get a little bit better, but did not go away.  Her chest x-ray from May 31, 2023 shows: IMPRESSION: Minimal left basilar scar or atelectasis. No acute cardiopulmonary disease.   Distended stomach beneath the left hemidiaphragm with a air-fluid level. Please correlate for any symptoms or additional evaluation as clinically appropriate  Allergic rhinitis/allergic conjunctivitis: She has not been receiving allergy injections since being sick.  She is using azelastine  nasal spray 2-3 times a day and ipratropium twice a day every day.  She is also taking Mucinex twice a day and is drinking water but reports she should probably drink more.  She reports rhinorrhea that is clear and may  be yellow.  She also reports nasal congestion, postnasal drip-enough to fill a swimming pool, and some sinus pressure.  She is also using Xlear nasal spray.  Reflux: She continues to take omeprazole  almost every day.  She has not seen GI in years.  She mentions in the past she has had [employer] fundoplication.  She thinks she last saw someone with [facility].  She reports that she has felt something in her throat.  She mentions that she last had a Pneumovax approximately 10 to 12 years ago and that in the past it did help boost her numbers.  Her lab work from 05/31/23 shows: CBC with differential, complement total, diphtheria and tetanus titers all normal. IgG and IgM  normal. IgA slightly decreased. Strep pneumonia titers protective to 10/23.  Drug Allergies:  Allergies  Allergen Reactions   Moxifloxacin Hcl Itching   Cefdinir  Hives and Diarrhea   Citrus Diarrhea   Erythromycin    Pineapple Other (See Comments)   Sulfa Antibiotics    Sulfur Other (See Comments)    Review of Systems: Negative except as per HPI   Physical Exam: BP (!) 130/90 (BP Location: Left Arm, Patient Position: Sitting, Cuff Size: Normal)   Pulse 92   Temp 98.1 F (36.7 C) (Oral)   Resp 16   SpO2 98%    Physical Exam Constitutional:      Comments:  Looks fatiuged  HENT:     Head: Normocephalic and atraumatic.     Comments: Pharynx normal. Eyes normal. Ears normal. Nose:bilateral lower turbinates moderately edematous and erythematous with clear drainage noted    Right Ear: Tympanic membrane, ear canal and external ear normal.     Left Ear: Tympanic membrane, ear canal and external ear normal.     Mouth/Throat:     Mouth: Mucous membranes are moist.     Pharynx: Oropharynx is clear.  Eyes:     Conjunctiva/sclera: Conjunctivae normal.  Cardiovascular:     Rate and Rhythm: Regular rhythm.     Heart sounds: Normal heart sounds.  Pulmonary:     Effort: Pulmonary effort is normal.     Breath sounds: Normal breath sounds.     Comments: Lungs clear to auscultation Musculoskeletal:     Cervical back: Neck supple.  Skin:    General: Skin is warm.  Neurological:     Mental Status: She is alert and oriented to person, place, and time.  Psychiatric:        Mood and Affect: Mood normal.        Behavior: Behavior normal.        Thought Content: Thought content normal.        Judgment: Judgment normal.     Diagnostics: FVC 2.51 L (73%), FEV1 2.19 L (80%), FEV1/FVC 0.87.  Spirometry indicates normal spirometry with reduced FVC.  He noted during testing.  Assessment and Plan: 1. Chronic cough   2. Recurrent infections   3. Moderate persistent asthma with acute  exacerbation   4. Seasonal and perennial allergic rhinitis   5. Gastroesophageal reflux disease, unspecified whether esophagitis present   6. Allergic conjunctivitis of both eyes     Meds ordered this encounter  Medications   DISCONTD: methylPREDNISolone  acetate (DEPO-MEDROL ) injection 40 mg   predniSONE  (DELTASONE ) 10 MG tablet    Sig: Starting tomorrow (06/15/23) Take 2 tablets twice a day for 4 days, then stop    Dispense:  16 tablet    Refill:  0   montelukast  (  SINGULAIR ) 10 MG tablet    Sig: Take 1 tablet (10 mg total) by mouth at bedtime.    Dispense:  90 tablet    Refill:  1    Please dispense 90 day supply.   methylPREDNISolone  acetate (DEPO-MEDROL ) injection 40 mg    Patient Instructions  Asthma- with acute exacerbation Depo Medrol  40 mg IM given today in office Starting tomorrow take prednisone  10 mg taking 2 tablets twice a day for 4 days Continue montelukast  10 mg once a day to prevent cough or wheeze Continue Symbicort  160/4.5 mcg-2 puffs  twice a day with a spacer to prevent cough or wheeze.  Make sure and use this medication every day. Rinse mouth out after Continue albuterol  2 puffs every 4 hours as needed for cough or wheeze OR Instead use albuterol  0.083% solution via nebulizer one unit vial every 4 hours as needed for cough or wheeze For asthma flare and NOW, begin Spiriva  1.25 mcg 2 puffs once a day for 2 weeks or until cough and wheeze free Recommend Xolair injections if needed for asthma control We will order a CT chest without contrast due to chronic cough  Asthma control goals:  Full participation in all desired activities (may need albuterol  before activity) Albuterol  use two time or less a week on average (not counting use with activity) Cough interfering with sleep two time or less a month Oral steroids no more than once a year No hospitalizations    Chronic cough Getting sinus and chest ct without contrast due to chronic cough  Allergic  rhinitis/allergic conjunctivitis Continue allergen avoidance measures directed toward mold, weed pollen, tree pollen, dust mite, and dog as listed below Continue allergen immunotherapy and have access to an epinephrine  autoinjector set per protocol. Hold off on allergy injections for now due to cough Decrease azelastine  nasal spray 2 sprays in each nostril up to twice a day as needed for nasal symptoms Continue ipratropium 2 sprays in each nostril up to twice a day as needed for a runny nose Continue cetirizine  10 mg once a day as needed for runny nose Consider Mucinex 600 to 1200 mg twice a day as needed for nasal congestion Consider saline nasal rinses as needed for nasal symptoms. Use this before any medicated nasal sprays for best result Will order sinus CT due to your continued symptoms.  We will call you with results once they are back Schedule to get your Pneumovax due to strep pneumonia titers being protective to 10 out of 23.  We will get lab work 4 to 6 weeks after you get your Pneumovax to see how you respond to the vaccine. Pneumovax given today in office.  Reflux Continue dietary and lifestyle modifications as listed below Continue omeprazole  once a day to control reflux.  Make sure and take this education every day. Will refer you back to GI due to abdominal bloating and reflux.  Our office will be in contact with you about this referral  If your symptoms do not get better or worsen please go to the emergency room Discussed that bloating is not a symptom of food allergy.  Also discussed how her cough and abdominal bloating could be due to gastrointestinal symptoms since she is not responding to traditional treatment for asthma Call the clinic if this treatment plan is not working well for you.  Keep already scheduled follow-up appointment on July 12, 2023 at 3 PM with Dr. Marinda or sooner if needed.  Reducing Pollen Exposure The American  Academy of All[employer], Asthma and  Immunology suggests the following steps to reduce your exposure to pollen during allergy seasons. Do not hang sheets or clothing out to dry; pollen may collect on these items. Do not mow lawns or spend time around freshly cut grass; mowing stirs up pollen. Keep windows closed at night.  Keep car windows closed while driving. Minimize morning activities outdoors, a time when pollen counts are usually at their highest. Stay indoors as much as possible when pollen counts or humidity is high and on windy days when pollen tends to remain in the air longer. Use air conditioning when possible.  Many air conditioners have filters that trap the pollen spores. Use a HEPA room air filter to remove pollen form the indoor air you breathe.  Control of Mold Allergen Mold and fungi can grow on a variety of surfaces provided certain temperature and moisture conditions exist.  Outdoor molds grow on plants, decaying vegetation and soil.  The major outdoor mold, Alternaria and Cladosporium, are found in very high numbers during hot and dry conditions.  Generally, a late Summer - Fall peak is seen for common outdoor fungal spores.  Rain will temporarily lower outdoor mold spore count, but counts rise rapidly when the rainy period ends.  The most important indoor molds are Aspergillus and Penicillium.  Dark, humid and poorly ventilated basements are ideal sites for mold growth.  The next most common sites of mold growth are the bathroom and the kitchen.  Outdoor [employer] Use air conditioning and keep windows closed Avoid exposure to decaying vegetation. Avoid leaf raking. Avoid grain handling. Consider wearing a face mask if working in moldy areas.  Indoor Mold Control Maintain humidity below 50%. Clean washable surfaces with 5% bleach solution. Remove sources e.g. Contaminated carpets.   Control of Dust Mite Allergen Dust mites play a major role in allergic asthma and rhinitis. They occur in environments  with high humidity wherever human skin is found. Dust mites absorb humidity from the atmosphere (ie, they do not drink) and feed on organic matter (including shed human and animal skin). Dust mites are a microscopic type of insect that you cannot see with the naked eye. High levels of dust mites have been detected from mattresses, pillows, carpets, upholstered furniture, bed covers, clothes, soft toys and any woven material. The principal allergen of the dust mite is found in its feces. A gram of dust may contain 1,000 mites and 250,000 fecal particles. Mite antigen is easily measured in the air during house cleaning activities. Dust mites do not bite and do not cause harm to humans, other than by triggering allergies/asthma.  Ways to decrease your exposure to dust mites in your home:  1. Encase mattresses, box springs and pillows with a mite-impermeable barrier or cover  2. Wash sheets, blankets and drapes weekly in hot water (130 F) with detergent and dry them in a dryer on the hot setting.  3. Have the room cleaned frequently with a vacuum cleaner and a damp dust-mop. For carpeting or rugs, vacuuming with a vacuum cleaner equipped with a high-efficiency particulate air (HEPA) filter. The dust mite allergic individual should not be in a room which is being cleaned and should wait 1 hour after cleaning before going into the room.  4. Do not sleep on upholstered furniture (eg, couches).  5. If possible removing carpeting, upholstered furniture and drapery from the home is ideal. Horizontal blinds should be eliminated in the rooms where the person spends  the most time (bedroom, study, television room). Washable vinyl, roller-type shades are optimal.  6. Remove all non-washable stuffed toys from the bedroom. Wash stuffed toys weekly like sheets and blankets above.  7. Reduce indoor humidity to less than 50%. Inexpensive humidity monitors can be purchased at most hardware stores. Do not use a  humidifier as can make the problem worse and are not recommended.  Control of Dog or Cat Allergen Avoidance is the best way to manage a dog or cat allergy. If you have a dog or cat and are allergic to dog or cats, consider removing the dog or cat from the home. If you have a dog or cat but don't want to find it a new home, or if your family wants a pet even though someone in the household is allergic, here are some strategies that may help keep symptoms at bay:  Keep the pet out of your bedroom and restrict it to only a few rooms. Be advised that keeping the dog or cat in only one room will not limit the allergens to that room. Don't pet, hug or kiss the dog or cat; if you do, wash your hands with soap and water. High-efficiency particulate air (HEPA) cleaners run continuously in a bedroom or living room can reduce allergen levels over time. Regular use of a high-efficiency vacuum cleaner or a central vacuum can reduce allergen levels. Giving your dog or cat a bath at least once a week can reduce airborne allergen.  Return in about 4 weeks (around 07/12/2023), or if symptoms worsen or fail to improve.    Thank you for the opportunity to care for this patient.  Please do not hesitate to contact me with questions.  Wanda Craze, FNP [facility] 

## 2023-06-15 NOTE — Telephone Encounter (Signed)
 Scheduled both at [facility] for Friday January 10th at 10:00 and 11:30. Spoke with pt and she will not be back in town until the 20th- gave her the number phone to call them to reschedule.

## 2023-06-15 NOTE — Telephone Encounter (Signed)
 Spoke with Bonnie Lowe and both cpt codes 13086 and [phone] approved. Approval number [id number]. Valid 06/14/23 through 07/13/23.Please schedule both CT chest without contrast and Sinus CT without contrast.

## 2023-06-21 NOTE — Telephone Encounter (Signed)
 Pt has changed her CT appointment to 1/29.

## 2023-06-22 ENCOUNTER — Ambulatory Visit (HOSPITAL_BASED_OUTPATIENT_CLINIC_OR_DEPARTMENT_OTHER): Payer: BC Managed Care – PPO

## 2023-06-27 ENCOUNTER — Telehealth: Payer: Self-pay

## 2023-06-27 NOTE — Telephone Encounter (Signed)
 Ricki Charon,  Can you help me out with this referral?  Thanks

## 2023-06-27 NOTE — Telephone Encounter (Signed)
-----   Message from Tinnie Forehand sent at 06/14/2023 11:00 AM EST ----- Please refer back to [facility] due to reflux and bloating of abdomen

## 2023-07-11 ENCOUNTER — Ambulatory Visit (HOSPITAL_BASED_OUTPATIENT_CLINIC_OR_DEPARTMENT_OTHER)
Admission: RE | Admit: 2023-07-11 | Discharge: 2023-07-11 | Disposition: A | Discharge status: Home / Self Care | Payer: BC Managed Care – PPO | Source: Ambulatory Visit | Attending: Family | Admitting: Family

## 2023-07-11 DIAGNOSIS — B999 Unspecified infectious disease: Secondary | ICD-10-CM | POA: Diagnosis present

## 2023-07-11 DIAGNOSIS — R053 Chronic cough: Secondary | ICD-10-CM | POA: Diagnosis present

## 2023-07-12 ENCOUNTER — Other Ambulatory Visit: Payer: Self-pay | Admitting: Internal Medicine

## 2023-07-12 ENCOUNTER — Ambulatory Visit (INDEPENDENT_AMBULATORY_CARE_PROVIDER_SITE_OTHER): Payer: BC Managed Care – PPO | Admitting: Internal Medicine

## 2023-07-12 ENCOUNTER — Encounter: Payer: Self-pay | Admitting: Internal Medicine

## 2023-07-12 VITALS — BP 124/74 | HR 92 | Temp 97.7°F | Resp 18 | Wt 180.9 lb

## 2023-07-12 DIAGNOSIS — H1013 Acute atopic conjunctivitis, bilateral: Secondary | ICD-10-CM

## 2023-07-12 DIAGNOSIS — J4540 Moderate persistent asthma, uncomplicated: Secondary | ICD-10-CM | POA: Diagnosis not present

## 2023-07-12 DIAGNOSIS — J3089 Other allergic rhinitis: Secondary | ICD-10-CM | POA: Diagnosis not present

## 2023-07-12 DIAGNOSIS — K219 Gastro-esophageal reflux disease without esophagitis: Secondary | ICD-10-CM

## 2023-07-12 DIAGNOSIS — J302 Other seasonal allergic rhinitis: Secondary | ICD-10-CM

## 2023-07-12 MED ORDER — BUDESONIDE 0.5 MG/2ML IN SUSP: RESPIRATORY_TRACT | 1 refills | Status: DC

## 2023-07-12 MED ORDER — BUDESONIDE-FORMOTEROL FUMARATE 160-4.5 MCG/ACT IN AERO: 2.0000 | INHALATION_SPRAY | Freq: Two times a day (BID) | RESPIRATORY_TRACT | 5 refills | Status: DC

## 2023-07-12 MED ORDER — ALBUTEROL SULFATE (2.5 MG/3ML) 0.083% IN NEBU: 2.5000 mg | INHALATION_SOLUTION | RESPIRATORY_TRACT | 1 refills | Status: DC | PRN

## 2023-07-12 MED ORDER — MONTELUKAST SODIUM 10 MG PO TABS: 10.0000 mg | ORAL_TABLET | Freq: Every day | ORAL | 1 refills | Status: DC

## 2023-07-12 MED ORDER — IPRATROPIUM BROMIDE 0.03 % NA SOLN: NASAL | 2 refills | Status: DC

## 2023-07-12 MED ORDER — AZELASTINE-FLUTICASONE 137-50 MCG/ACT NA SUSP: NASAL | 3 refills | Status: DC

## 2023-07-12 MED ORDER — SPIRIVA RESPIMAT 1.25 MCG/ACT IN AERS: 2.0000 | INHALATION_SPRAY | Freq: Every day | RESPIRATORY_TRACT | 3 refills | Status: DC

## 2023-07-12 MED ORDER — EPINEPHRINE 0.3 MG/0.3ML IJ SOAJ: 0.3000 mg | INTRAMUSCULAR | 1 refills | Status: AC | PRN

## 2023-07-12 NOTE — Patient Instructions (Addendum)
Asthma- Continue montelukast 10 mg once a day to prevent cough or wheeze Continue Symbicort 160/4.5 mcg-2 puffs  twice a day with a spacer to prevent cough or wheeze.  Make sure and use this medication every day. Rinse mouth out after Continue Spiriva 2 puffs daily Continue albuterol 2 puffs every 4 hours as needed for cough or wheeze OR Instead use albuterol 0.083% solution via nebulizer one unit vial every 4 hours as needed for cough or wheeze During respiratory infections/asthma flares: add pulmicort 0.5 mg twice daily via nebulizer followed by albuterol 1 vial twice daily via nebulizer for 1-2 weeks or until symptoms controlled.  Asthma control goals:  Full participation in all desired activities (may need albuterol before activity) Albuterol use two time or less a week on average (not counting use with activity) Cough interfering with sleep two time or less a month Oral steroids no more than once a year No hospitalizations  Allergic rhinitis/allergic conjunctivitis Continue allergen avoidance measures directed toward mold, weed pollen, tree pollen, dust mite, and dog as listed below Continue allergen immunotherapy and have access to an epinephrine autoinjector set per protocol. Today restart at bottom of red. Continue azelastine nasal spray 2 sprays in each nostril up to twice a day as needed for nasal symptoms Continue ipratropium 2 sprays in each nostril up to twice a day as needed for a runny nose Continue cetirizine 10 mg once a day as needed for runny nose Consider saline nasal rinses as needed for nasal symptoms. Use this before any medicated nasal sprays for best result We will call once we have your results of your CT sinus and lungs. Will consider retesting for environmental allergies when cough improved  Reflux Continue dietary and lifestyle modifications as listed below Continue omeprazole 40 mg daily once a day to control reflux.  Make sure and take this education every  day. Reach out to [facility]   Follow up : 3 months, sooner if needed It was a pleasure meeting you in clinic today! Thank you for allowing me to participate in your care.  Tonny Bollman, MD [facility] [place]   Reducing Pollen Exposure The American Academy of Allergy, Asthma and Immunology suggests the following steps to reduce your exposure to pollen during allergy seasons. Do not hang sheets or clothing out to dry; pollen may collect on these items. Do not mow lawns or spend time around freshly cut grass; mowing stirs up pollen. Keep windows closed at night.  Keep car windows closed while driving. Minimize morning activities outdoors, a time when pollen counts are usually at their highest. Stay indoors as much as possible when pollen counts or humidity is high and on windy days when pollen tends to remain in the air longer. Use air conditioning when possible.  Many air conditioners have filters that trap the pollen spores. Use a HEPA room air filter to remove pollen form the indoor air you breathe.  Control of Mold Allergen Mold and fungi can grow on a variety of surfaces provided certain temperature and moisture conditions exist.  Outdoor molds grow on plants, decaying vegetation and soil.  The major outdoor mold, Alternaria and Cladosporium, are found in very high numbers during hot and dry conditions.  Generally, a late Summer - Fall peak is seen for common outdoor fungal spores.  Rain will temporarily lower outdoor mold spore count, but counts rise rapidly when the rainy period ends.  The most important indoor molds are Aspergillus and Penicillium.  Dark, humid and poorly ventilated basements are ideal sites for mold growth.  The next most common sites of mold growth are the bathroom and the kitchen.  Outdoor [employer] Use air conditioning and keep windows closed Avoid exposure to decaying vegetation. Avoid leaf raking. Avoid grain handling. Consider wearing a face  mask if working in moldy areas.  Indoor Mold Control Maintain humidity below 50%. Clean washable surfaces with 5% bleach solution. Remove sources e.g. Contaminated carpets.   Control of Dust Mite Allergen Dust mites play a major role in allergic asthma and rhinitis. They occur in environments with high humidity wherever human skin is found. Dust mites absorb humidity from the atmosphere (ie, they do not drink) and feed on organic matter (including shed human and animal skin). Dust mites are a microscopic type of insect that you cannot see with the naked eye. High levels of dust mites have been detected from mattresses, pillows, carpets, upholstered furniture, bed covers, clothes, soft toys and any woven material. The principal allergen of the dust mite is found in its feces. A gram of dust may contain 1,000 mites and 250,000 fecal particles. Mite antigen is easily measured in the air during house cleaning activities. Dust mites do not bite and do not cause harm to humans, other than by triggering allergies/asthma.  Ways to decrease your exposure to dust mites in your home:  1. Encase mattresses, box springs and pillows with a mite-impermeable barrier or cover  2. Wash sheets, blankets and drapes weekly in hot water (130 F) with detergent and dry them in a dryer on the hot setting.  3. Have the room cleaned frequently with a vacuum cleaner and a damp dust-mop. For carpeting or rugs, vacuuming with a vacuum cleaner equipped with a high-efficiency particulate air (HEPA) filter. The dust mite allergic individual should not be in a room which is being cleaned and should wait 1 hour after cleaning before going into the room.  4. Do not sleep on upholstered furniture (eg, couches).  5. If possible removing carpeting, upholstered furniture and drapery from the home is ideal. Horizontal blinds should be eliminated in the rooms where the person spends the most time (bedroom, study, television room).  Washable vinyl, roller-type shades are optimal.  6. Remove all non-washable stuffed toys from the bedroom. Wash stuffed toys weekly like sheets and blankets above.  7. Reduce indoor humidity to less than 50%. Inexpensive humidity monitors can be purchased at most hardware stores. Do not use a humidifier as can make the problem worse and are not recommended.  Control of Dog or Cat Allergen Avoidance is the best way to manage a dog or cat allergy. If you have a dog or cat and are allergic to dog or cats, consider removing the dog or cat from the home. If you have a dog or cat but don't want to find it a new home, or if your family wants a pet even though someone in the household is allergic, here are some strategies that may help keep symptoms at bay:  Keep the pet out of your bedroom and restrict it to only a few rooms. Be advised that keeping the dog or cat in only one room will not limit the allergens to that room. Don't pet, hug or kiss the dog or cat; if you do, wash your hands with soap and water. High-efficiency particulate air (HEPA) cleaners run continuously in a bedroom or living room can reduce allergen levels over time. Regular use of  a high-efficiency vacuum cleaner or a central vacuum can reduce allergen levels. Giving your dog or cat a bath at least once a week can reduce airborne allergen.

## 2023-07-12 NOTE — Progress Notes (Signed)
FOLLOW UP Date of Service/Encounter:  07/12/23  Subjective:  Bonnie Lowe (DOB: 06-11-1971) is a 53 y.o. female who [facility] in re-evaluation of the following: asthma, allergic rhinitis, reflux History obtained from: chart review and patient.  For Review, LV was on 06/13/22  with Nehemiah Settle, FNP seen for routine follow-up. Today is my first encounter with hre.  Summary of prior visits: she reported chronic cough with yellow sputum not improved from z-pack given at [facility]. Reported cough coming from throat. Asthma current plan: symbicort 160, spiriva 3 rounds of antibiotics since November.  Immune lab work from 05/31/23 shows: CBC with differential, complement total, diphtheria and tetanus titers all normal. IgG and IgM normal. IgA slightly decreased. Strep pneumonia titers protective to 10/23.  2024 CXR: Minimal left basilar scar or atelectasis. No acute cardiopulmonary disease. Distended stomach beneath the left hemidiaphragm with a air-fluid level. Please correlate for any symptoms or additional evaluation as clinically appropriate" Has ARC on AIT, current meds: azelastine nasal spray, ipatropim nasal spray and mucinex PRN along with Xlear nasal spray Reflux: on 40 mg omprazole daily, hasn't followed up with GI in a while-referred at this visit. 2024: AEC 100  She was prescribed a prednisone burst, CT sinus ordered, and she was instructed to get the Pneumovax vaccine. ED visit 01.03.25: diagnosed with RSV bronchiolitis ------------------------------------------------------------- Today presents for follow-up. Discussed the use of AI scribe software for clinical note transcription with the patient, who gave verbal consent to proceed.  History of Present Illness   The patient presents with persistent cough and asthma management.  She has been experiencing a persistent cough and was recently diagnosed with RSV after visiting the  emergency room. She received a COVID, flu, and RSV test, with RSV being positive. She had previously received a prednisone shot and a pneumonia vaccine the day prior in our office. She had been coming regularly for ongoing cough but then was found to have RSV. Despite using her nebulizer with albuterol, she did not find relief until she combined it with prednisone, which improved her symptoms. She reports a lingering cough but feels much better than before. She has not used her nebulizer in around 2 weeks.  She has been using Symbicort and Spiriva for asthma management. She started Symbicort in November, using two puffs twice a day, and has been on Spiriva as well. She typically uses her inhalers only when feeling unwell but acknowledges she should use them daily. She has been using them daily since November.  She has been on allergy injections for about fifteen years, with her last shot in November. She was on a maintenance schedule, receiving shots every two to three weeks. Looking back, she has been on maintenance since starting at our office in 2020 and was previously on allergy injections before that at another office for over 10 years. She takes Zyrtec and montelukast daily for allergies and uses nasal sprays regularly. Her allergies were well-controlled before her recent illness. She has not had allergy testing in many years.  On review of her records, in 2021 she had testing positive to dust mites with IDs positive to ragweed mix, weed mix, tree mix, mold mix 2 and dog.   Her last injection was 05/04/2023 and she received 0.5 mL of 2 red vials.  One of her red vial's contains mold.  The other contains weed, tree, dust mite and dog  She has a history of reflux and has been referred  to a gastroenterologist. She takes omeprazole 40 mg once daily but not on an empty stomach.  She underwent a CT scan of her sinuses and chest recently, though the results are not yet available. She was unable to complete  the scans earlier due to travel and family commitments, including her father's heart surgery.   Overall she feels she is finally starting to feel better and would like to restart her allergy injections.  She questions how long she needs to continue on allergy injections. She also questions how long she needs to continue on daily asthma medications.      All medications reviewed by clinical staff and updated in chart. No new pertinent medical or surgical history except as noted in HPI.  ROS: All others negative except as noted per HPI.   Objective:  BP 124/74   Pulse 92   Temp 97.7 F (36.5 C) (Temporal)   Resp 18   Wt 180 lb 14.4 oz (82.1 kg)   SpO2 98%   BMI 30.10 kg/m  Body mass index is 30.1 kg/m. Physical Exam: General Appearance:  Alert, cooperative, no distress, appears stated age  Head:  Normocephalic, without obvious abnormality, atraumatic  Eyes:  Conjunctiva clear, EOM's intact  Ears EACs normal bilaterally and normal TMs bilaterally  Nose: Nares normal, hypertrophic turbinates, normal mucosa, and no visible anterior polyps  Throat: Lips, tongue normal; teeth and gums normal, normal posterior oropharynx  Neck: Supple, symmetrical  Lungs:   clear to auscultation bilaterally, Respirations unlabored, intermittent dry coughing  Heart:  regular rate and rhythm and no murmur, Appears well perfused  Extremities: No edema  Skin: Skin color, texture, turgor normal and no rashes or lesions on visualized portions of skin  Neurologic: No gross deficits   Labs:  Lab Orders  No laboratory test(s) ordered today    Spirometry:  Tracings reviewed. Her effort: Good reproducible efforts. FVC: 23.13L FEV1: 2.54L, 93% predicted FEV1/FVC ratio: 0.81 Interpretation: Spirometry consistent with normal pattern.  Please see scanned spirometry results for details.  Assessment/Plan   Asthma-improving Continue montelukast 10 mg once a day to prevent cough or wheeze Continue  Symbicort 160/4.5 mcg-2 puffs  twice a day with a spacer to prevent cough or wheeze.  Make sure and use this medication every day. Rinse mouth out after Continue Spiriva 2 puffs daily Continue albuterol 2 puffs every 4 hours as needed for cough or wheeze OR Instead use albuterol 0.083% solution via nebulizer one unit vial every 4 hours as needed for cough or wheeze During respiratory infections/asthma flares: add pulmicort 0.5 mg twice daily via nebulizer followed by albuterol 1 vial twice daily via nebulizer for 1-2 weeks or until symptoms controlled.  Asthma control goals:  Full participation in all desired activities (may need albuterol before activity) Albuterol use two time or less a week on average (not counting use with activity) Cough interfering with sleep two time or less a month Oral steroids no more than once a year No hospitalizations  Allergic rhinitis/allergic conjunctivitis-at goal on AIT Continue allergen avoidance measures directed toward mold, weed pollen, tree pollen, dust mite, and dog as listed below Continue allergen immunotherapy and have access to an epinephrine autoinjector set per protocol. Today restart at bottom of red. Continue azelastine nasal spray 2 sprays in each nostril up to twice a day as needed for nasal symptoms Continue ipratropium 2 sprays in each nostril up to twice a day as needed for a runny nose Continue cetirizine 10 mg once a  day as needed for runny nose Consider saline nasal rinses as needed for nasal symptoms. Use this before any medicated nasal sprays for best result We will call once we have your results of your CT sinus and lungs. Will consider retesting for environmental allergies when cough improved  Reflux-not at goal Continue dietary and lifestyle modifications as listed below Continue omeprazole 40 mg daily once a day to control reflux.  Make sure and take this education every day. Reach out to [facility]   Follow up : 3 months, sooner  if needed It was a pleasure meeting you in clinic today! Thank you for allowing me to participate in your care.  Other: allergy injection given in clinic today  Tonny Bollman, MD  [facility] [place] [facility] [place]

## 2023-07-20 ENCOUNTER — Ambulatory Visit (INDEPENDENT_AMBULATORY_CARE_PROVIDER_SITE_OTHER): Payer: Self-pay

## 2023-07-20 DIAGNOSIS — J309 Allergic rhinitis, unspecified: Secondary | ICD-10-CM

## 2023-07-23 NOTE — Progress Notes (Signed)
 Please let Bonnie Lowe know that I have received her results and  discussed with Dr. Marinda.   Her sinus CT does not show anything worrisome. There is a little fluid in her sinus,but not enough to need an antibiotic. We will refer you to ENT.  Please make sure that she gets her repeat pneumococcal titers 4-6 weeks after she got her Pneumovax. We will call with results once those are back.   We can consider re-skin testing her to environmental allergies.   Keep her follow up appointment on 10/11/23 with Dr. Marinda at 10:15 AM.

## 2023-07-31 ENCOUNTER — Ambulatory Visit (INDEPENDENT_AMBULATORY_CARE_PROVIDER_SITE_OTHER): Payer: Self-pay

## 2023-07-31 DIAGNOSIS — J309 Allergic rhinitis, unspecified: Secondary | ICD-10-CM

## 2023-08-13 ENCOUNTER — Ambulatory Visit (INDEPENDENT_AMBULATORY_CARE_PROVIDER_SITE_OTHER)

## 2023-08-13 DIAGNOSIS — J309 Allergic rhinitis, unspecified: Secondary | ICD-10-CM | POA: Diagnosis not present

## 2023-08-24 ENCOUNTER — Ambulatory Visit (INDEPENDENT_AMBULATORY_CARE_PROVIDER_SITE_OTHER): Payer: Self-pay

## 2023-08-24 DIAGNOSIS — J309 Allergic rhinitis, unspecified: Secondary | ICD-10-CM | POA: Diagnosis not present

## 2023-09-04 ENCOUNTER — Ambulatory Visit (INDEPENDENT_AMBULATORY_CARE_PROVIDER_SITE_OTHER): Payer: Self-pay

## 2023-09-04 DIAGNOSIS — J309 Allergic rhinitis, unspecified: Secondary | ICD-10-CM | POA: Diagnosis not present

## 2023-09-13 ENCOUNTER — Ambulatory Visit (INDEPENDENT_AMBULATORY_CARE_PROVIDER_SITE_OTHER): Payer: Self-pay

## 2023-09-13 DIAGNOSIS — J309 Allergic rhinitis, unspecified: Secondary | ICD-10-CM

## 2023-09-24 ENCOUNTER — Ambulatory Visit (INDEPENDENT_AMBULATORY_CARE_PROVIDER_SITE_OTHER): Payer: Self-pay

## 2023-09-24 DIAGNOSIS — J309 Allergic rhinitis, unspecified: Secondary | ICD-10-CM | POA: Diagnosis not present

## 2023-10-11 ENCOUNTER — Ambulatory Visit (INDEPENDENT_AMBULATORY_CARE_PROVIDER_SITE_OTHER): Payer: BC Managed Care – PPO | Admitting: Internal Medicine

## 2023-10-11 ENCOUNTER — Encounter: Payer: Self-pay | Admitting: Internal Medicine

## 2023-10-11 ENCOUNTER — Telehealth: Payer: Self-pay | Admitting: Internal Medicine

## 2023-10-11 VITALS — BP 136/80 | HR 97 | Temp 98.6°F | Resp 24 | Wt 184.0 lb

## 2023-10-11 DIAGNOSIS — J3089 Other allergic rhinitis: Secondary | ICD-10-CM

## 2023-10-11 DIAGNOSIS — B999 Unspecified infectious disease: Secondary | ICD-10-CM | POA: Diagnosis not present

## 2023-10-11 DIAGNOSIS — J320 Chronic maxillary sinusitis: Secondary | ICD-10-CM | POA: Diagnosis not present

## 2023-10-11 DIAGNOSIS — J0100 Acute maxillary sinusitis, unspecified: Secondary | ICD-10-CM | POA: Insufficient documentation

## 2023-10-11 DIAGNOSIS — J302 Other seasonal allergic rhinitis: Secondary | ICD-10-CM

## 2023-10-11 DIAGNOSIS — J4540 Moderate persistent asthma, uncomplicated: Secondary | ICD-10-CM

## 2023-10-11 MED ORDER — BUDESONIDE-FORMOTEROL FUMARATE 160-4.5 MCG/ACT IN AERO: 2.0000 | INHALATION_SPRAY | Freq: Two times a day (BID) | RESPIRATORY_TRACT | 5 refills | Status: DC

## 2023-10-11 MED ORDER — MONTELUKAST SODIUM 10 MG PO TABS: 10.0000 mg | ORAL_TABLET | Freq: Every day | ORAL | 1 refills | Status: DC

## 2023-10-11 MED ORDER — CETIRIZINE HCL 10 MG PO TABS: 10.0000 mg | ORAL_TABLET | Freq: Every day | ORAL | 3 refills | Status: DC

## 2023-10-11 MED ORDER — SPIRIVA RESPIMAT 1.25 MCG/ACT IN AERS: 2.0000 | INHALATION_SPRAY | Freq: Every day | RESPIRATORY_TRACT | 3 refills | Status: DC

## 2023-10-11 MED ORDER — IPRATROPIUM BROMIDE 0.03 % NA SOLN: NASAL | 2 refills | Status: DC

## 2023-10-11 MED ORDER — AZELASTINE-FLUTICASONE 137-50 MCG/ACT NA SUSP
NASAL | 3 refills | Status: DC
Start: 2023-10-11 — End: 2023-11-15

## 2023-10-11 MED ORDER — OMEPRAZOLE 40 MG PO CPDR: 40.0000 mg | DELAYED_RELEASE_CAPSULE | Freq: Every day | ORAL | 2 refills | Status: DC

## 2023-10-11 MED ORDER — ALBUTEROL SULFATE HFA 108 (90 BASE) MCG/ACT IN AERS: INHALATION_SPRAY | RESPIRATORY_TRACT | 1 refills | Status: DC

## 2023-10-11 MED ORDER — BUDESONIDE 0.5 MG/2ML IN SUSP: RESPIRATORY_TRACT | 1 refills | Status: DC

## 2023-10-11 MED ORDER — AMOXICILLIN-POT CLAVULANATE 875-125 MG PO TABS
1.0000 | ORAL_TABLET | Freq: Two times a day (BID) | ORAL | 0 refills | Status: AC
Start: 2023-10-11 — End: 2023-10-21

## 2023-10-11 NOTE — Telephone Encounter (Signed)
 Bonnie Lowe called to tell Dr. Cornel Diesel that she just finished amoxicillin  500mg , and is requesting the different antibiotic that was discussed today at her appointment.

## 2023-10-11 NOTE — Telephone Encounter (Signed)
 Can you let [name] know that I sent in Augmentin  for her- to take 1 tablet twice daily x 10 days and take with probiotics or live cultured yogurt.

## 2023-10-11 NOTE — Patient Instructions (Addendum)
 Asthma- Continue montelukast  10 mg once a day to prevent cough or wheeze Continue Symbicort  160/4.5 mcg-2 puffs  twice a day with a spacer to prevent cough or wheeze.  Make sure and use this medication every day. Rinse mouth out after Continue Spiriva  2 puffs daily Continue albuterol  2 puffs every 4 hours as needed for cough or wheeze OR Instead use albuterol  0.083% solution via nebulizer one unit vial every 4 hours as needed for cough or wheeze During respiratory infections/asthma flares: add pulmicort  0.5 mg twice daily via nebulizer followed by albuterol  1 vial twice daily via nebulizer for 1-2 weeks or until symptoms controlled. Asthma control goals:  Full participation in all desired activities (may need albuterol  before activity) Albuterol  use two time or less a week on average (not counting use with activity) Cough interfering with sleep two time or less a month Oral steroids no more than once a year No hospitalizations Consider injectable asthma medication (already had 2 rounds of OCS in 2025)-will get labs today to see if qualifies. Holding on CBC given recent steroid course.  Allergic rhinitis/allergic conjunctivitis with ongoing acute maxillary sinusitis Continue allergen avoidance measures directed toward mold, weed pollen, tree pollen, dust mite, and dog as listed below Continue allergen immunotherapy and have access to an epinephrine  autoinjector set per protocol. Continue azelastine  nasal spray 2 sprays in each nostril up to twice a day as needed for nasal symptoms Continue ipratropium 2 sprays in each nostril up to twice a day as needed for a runny nose Continue cetirizine  10 mg once a day as needed for runny nose Consider saline nasal rinses as needed for nasal symptoms. Use this before any medicated nasal sprays for best result Labs for environmental allergies today, may consider skin testing pending results. Referring to ENT given recurrent sinus infections Call us  to  confirm which antibiotics you recently finished.   Recurrent infections:  Will obtain strep pneumonia repeat titers to complete immune work up (previously reassuring with exception of strep titers)  Reflux Continue dietary and lifestyle modifications as listed below Continue omeprazole  40 mg daily once a day to control reflux.  Make sure and take this education every day. Reach out to [facility]   Follow up : 3 months, sooner if needed It was a pleasure seeing you again in clinic today! Thank you for allowing me to participate in your care.  Jonathon Neighbors, MD [facility] [place]   Reducing Pollen Exposure The American Academy of Allergy, Asthma and Immunology suggests the following steps to reduce your exposure to pollen during allergy seasons. Do not hang sheets or clothing out to dry; pollen may collect on these items. Do not mow lawns or spend time around freshly cut grass; mowing stirs up pollen. Keep windows closed at night.  Keep car windows closed while driving. Minimize morning activities outdoors, a time when pollen counts are usually at their highest. Stay indoors as much as possible when pollen counts or humidity is high and on windy days when pollen tends to remain in the air longer. Use air conditioning when possible.  Many air conditioners have filters that trap the pollen spores. Use a HEPA room air filter to remove pollen form the indoor air you breathe.  Control of Mold Allergen Mold and fungi can grow on a variety of surfaces provided certain temperature and moisture conditions exist.  Outdoor molds grow on plants, decaying vegetation and soil.  The major outdoor mold, Alternaria and Cladosporium, are found  in very high numbers during hot and dry conditions.  Generally, a late Summer - Fall peak is seen for common outdoor fungal spores.  Rain will temporarily lower outdoor mold spore count, but counts rise rapidly when the rainy period ends.  The most important  indoor molds are Aspergillus and Penicillium.  Dark, humid and poorly ventilated basements are ideal sites for mold growth.  The next most common sites of mold growth are the bathroom and the kitchen.  Outdoor [employer] Use air conditioning and keep windows closed Avoid exposure to decaying vegetation. Avoid leaf raking. Avoid grain handling. Consider wearing a face mask if working in moldy areas.  Indoor Mold Control Maintain humidity below 50%. Clean washable surfaces with 5% bleach solution. Remove sources e.g. Contaminated carpets.   Control of Dust Mite Allergen Dust mites play a major role in allergic asthma and rhinitis. They occur in environments with high humidity wherever human skin is found. Dust mites absorb humidity from the atmosphere (ie, they do not drink) and feed on organic matter (including shed human and animal skin). Dust mites are a microscopic type of insect that you cannot see with the naked eye. High levels of dust mites have been detected from mattresses, pillows, carpets, upholstered furniture, bed covers, clothes, soft toys and any woven material. The principal allergen of the dust mite is found in its feces. A gram of dust may contain 1,000 mites and 250,000 fecal particles. Mite antigen is easily measured in the air during house cleaning activities. Dust mites do not bite and do not cause harm to humans, other than by triggering allergies/asthma.  Ways to decrease your exposure to dust mites in your home:  1. Encase mattresses, box springs and pillows with a mite-impermeable barrier or cover  2. Wash sheets, blankets and drapes weekly in hot water (130 F) with detergent and dry them in a dryer on the hot setting.  3. Have the room cleaned frequently with a vacuum cleaner and a damp dust-mop. For carpeting or rugs, vacuuming with a vacuum cleaner equipped with a high-efficiency particulate air (HEPA) filter. The dust mite allergic individual should not be in a  room which is being cleaned and should wait 1 hour after cleaning before going into the room.  4. Do not sleep on upholstered furniture (eg, couches).  5. If possible removing carpeting, upholstered furniture and drapery from the home is ideal. Horizontal blinds should be eliminated in the rooms where the person spends the most time (bedroom, study, television room). Washable vinyl, roller-type shades are optimal.  6. Remove all non-washable stuffed toys from the bedroom. Wash stuffed toys weekly like sheets and blankets above.  7. Reduce indoor humidity to less than 50%. Inexpensive humidity monitors can be purchased at most hardware stores. Do not use a humidifier as can make the problem worse and are not recommended.  Control of Dog or Cat Allergen Avoidance is the best way to manage a dog or cat allergy. If you have a dog or cat and are allergic to dog or cats, consider removing the dog or cat from the home. If you have a dog or cat but don't want to find it a new home, or if your family wants a pet even though someone in the household is allergic, here are some strategies that may help keep symptoms at bay:  Keep the pet out of your bedroom and restrict it to only a few rooms. Be advised that keeping the dog or cat  in only one room will not limit the allergens to that room. Don't pet, hug or kiss the dog or cat; if you do, wash your hands with soap and water. High-efficiency particulate air (HEPA) cleaners run continuously in a bedroom or living room can reduce allergen levels over time. Regular use of a high-efficiency vacuum cleaner or a central vacuum can reduce allergen levels. Giving your dog or cat a bath at least once a week can reduce airborne allergen.

## 2023-10-11 NOTE — Progress Notes (Signed)
 FOLLOW UP Date of Service/Encounter:  10/11/23  Subjective:  Bonnie Lowe (DOB: 03-Jul-1970) is a 53 y.o. female who [facility] in re-evaluation of the following: asthma, allergic rhinitis, recurrent sinus infections, acid reflux History obtained from: chart review and patient.  For Review, LV was on 07/12/23  with Dr.Rylon Poitra seen for routine follow-up. See below for summary of history and diagnostics.  Therapeutic plans/changes recommended: She reported a lingering cough that improved with prednisone  after being diagnosed with RSV bronchiolitis.  However overall was feeling improvement.  Her FEV1 was 93%.  She had had a pause in her allergy injections while not feeling well, but these were restarted following that visit.  We are waiting on her CT sinus some chest results. Continued on AIT and taking her allergy medications as prescribed. ----------------------------------------------------- Pertinent History/Diagnostics:  Chronic cough/asthma: she reported chronic cough with yellow sputum not improved from z-pack given at [facility]. Reported cough coming from throat. Asthma current plan: symbicort  160, spiriva  2024 CXR: Minimal left basilar scar or atelectasis. No acute cardiopulmonary disease. Distended stomach beneath the left hemidiaphragm with a air-fluid level. Please correlate for any symptoms or additional evaluation as clinically appropriate" -2024: AEC 100 -CT CHEST 07/11/23: Left ninth rib fracture without complicating factors. No other focal abnormality is noted. Aortic Atherosclerosis -CT sinus 07/11/23 1. Small amount of layering fluid in the right maxillary sinus without associated mucosal thickening. 2. Single opacified posterior ethmoid air cell on the right. 3. Nasal septum bows 3 mm towards the left. Referred to ENT. Course of systemic steroids: 2 times in 2025. Recurrent infections:  Immune lab work from 05/31/23 shows: CBC with  differential, complement total, diphtheria and tetanus titers all normal. IgG and IgM normal. IgA slightly decreased. Strep pneumonia titers protective to 10/23. Awaiting post strep titers. 4 rounds of antibiotics since November 2024. Allergic Rhinitis: Has ARC on AIT, current meds: azelastine  nasal spray, ipatropim nasal spray and mucinex PRN along with Xlear nasal spray 2021 she had testing positive to dust mites with IDs positive to ragweed mix, weed mix, tree mix, mold mix 2 and dog.  One of her vials contains mold. The other contains weed, tree, dust mite and dog  she has been on maintenance since starting at our office in 2020 and was previously on allergy injections before that at another office for over 10 years.  Reflux: on 40 mg omprazole daily, hasn't followed up with GI in a while-referred at this visit. Other: acquired hypothyroidism --------------------------------------------------- Today presents for follow-up. Discussed the use of AI scribe software for clinical note transcription with the patient, who gave verbal consent to proceed.  History of Present Illness   Bonnie Lowe is a 53 year old female with recurrent bronchitis and sinusitis who presents with persistent respiratory symptoms despite recent antibiotic treatment.  She has been experiencing persistent coughing and significant nasal drainage following a recent illness. Two weeks ago, she received treatment at urgent care, which included a steroid shot, a five-day course of prednisone , and amoxicillin  500 mg three times daily for seven days. Despite this treatment, her symptoms have not fully resolved.  The nasal drainage is described as 'bright yellow' and requires frequent nose blowing. Initially, the drainage improved with antibiotics but returned after the course was completed. She also experiences ear pain, likely due to pressure, and facial pressure, particularly in the maxillary region.  She has a history of  bronchitis that often follows upper respiratory infections or allergies.  Her symptoms typically progress to bronchitis without antibiotic intervention. Since starting allergy shots 15-20 years ago, these episodes have become less frequent, but she still experienced significant episodes in November, December, and January.  Her current medications include Symbicort  and Spiriva , which she uses daily when sick but often forgets when well. She also takes omeprazole  for reflux, which is well-controlled, and uses nasal sprays such as ipratropium, azelastine , and cetirizine  for allergies. She requires a refill on cetirizine .  Her social history includes a demanding work schedule, particularly during [employer], which she attributes to exacerbating her symptoms. She is preparing for upcoming travel and wants to be well before her trips.      Chart Review: Last AIT injection 09/24/23. Received 0.5 mL of red vials   All medications reviewed by clinical staff and updated in chart. No new pertinent medical or surgical history except as noted in HPI.  ROS: All others negative except as noted per HPI.   Objective:  BP 136/80 (BP Location: Left Arm, Patient Position: Sitting, Cuff Size: Large)   Pulse 97   Temp 98.6 F (37 C) (Oral)   Resp (!) 24   Wt 184 lb (83.5 kg)   SpO2 96%   BMI 30.62 kg/m  Body mass index is 30.62 kg/m. Physical Exam: General Appearance:  Alert, cooperative, no distress, appears stated age  Head:  Normocephalic, without obvious abnormality, atraumatic  Eyes:  Conjunctiva clear, EOM's intact  Ears   Nose: Nares normal, hypertrophic turbinates, normal mucosa, and no visible anterior polyps  Throat: Lips, tongue normal; teeth and gums normal,  slightly erythematous posterior oropharnyx without tonsilar exudate  Neck: Supple, symmetrical  Lungs:   clear to auscultation bilaterally, Respirations unlabored, no coughing  Heart:  regular rate and rhythm and no murmur,  Appears well perfused  Extremities: No edema  Skin: Skin color, texture, turgor normal and no rashes or lesions on visualized portions of skin  Neurologic: No gross deficits   Labs:  Lab Orders         Allergens w/Total IgE Area 2         Strep pneumoniae 23 Serotypes IgG      Spirometry:  Tracings reviewed. Her effort: Good reproducible efforts. FVC: 3.71L FEV1: 2.68L, 99% predicted FEV1/FVC ratio: 0.72 Interpretation:  Spirometry without obstruction, normal FEV1, scooping noted. .  Please see scanned spirometry results for details.  Assessment/Plan   Asthma-not at goal, recurrent bronchitis due to sinus drainage leading to asthma flare. Discussed asthma biologics. Continue montelukast  10 mg once a day to prevent cough or wheeze Continue Symbicort  160/4.5 mcg-2 puffs  twice a day with a spacer to prevent cough or wheeze.  Make sure and use this medication every day. Rinse mouth out after Continue Spiriva  2 puffs daily Continue albuterol  2 puffs every 4 hours as needed for cough or wheeze OR Instead use albuterol  0.083% solution via nebulizer one unit vial every 4 hours as needed for cough or wheeze During respiratory infections/asthma flares: add pulmicort  0.5 mg twice daily via nebulizer followed by albuterol  1 vial twice daily via nebulizer for 1-2 weeks or until symptoms controlled. Asthma control goals:  Full participation in all desired activities (may need albuterol  before activity) Albuterol  use two time or less a week on average (not counting use with activity) Cough interfering with sleep two time or less a month Oral steroids no more than once a year No hospitalizations Consider injectable asthma medication (already had 2 rounds of OCS in 2025)-will  get labs today to see if qualifies. Holding on CBC given recent steroid course.  Allergic rhinitis/allergic conjunctivitis with ongoing acute maxillary sinusitis Continue allergen avoidance measures directed toward mold,  weed pollen, tree pollen, dust mite, and dog as listed below Continue allergen immunotherapy and have access to an epinephrine  autoinjector set per protocol.  Continue azelastine  nasal spray 2 sprays in each nostril up to twice a day as needed for nasal symptoms Continue ipratropium 2 sprays in each nostril up to twice a day as needed for a runny nose Continue cetirizine  10 mg once a day as needed for runny nose Consider saline nasal rinses as needed for nasal symptoms. Use this before any medicated nasal sprays for best result Labs for environmental allergies today, may consider skin testing pending results. Referring to ENT given recurrent sinus infections Call us  to confirm which antibiotics you recently finished.   Recurrent infections:  Will obtain strep pneumonia repeat titers to complete immune work up (previously reassuring with exception of strep titers)  Reflux Continue dietary and lifestyle modifications as listed below Continue omeprazole  40 mg daily once a day to control reflux.  Make sure and take this education every day. Reach out to [facility]  -  Follow up : 3 months, sooner if needed It was a pleasure seeing you again in clinic today! Thank you for allowing me to participate in your care.  Other: none  Jonathon Neighbors, MD  [facility]    Interim Hx; patient called back to let us  know she was on amoxicillin  x 7 days. Will send in Augmentin  x 10 days to cover Hib.

## 2023-10-11 NOTE — Telephone Encounter (Signed)
 Left message on pt's cell advised her to call back if questions or concerns

## 2023-10-12 ENCOUNTER — Telehealth: Payer: Self-pay | Admitting: Internal Medicine

## 2023-10-12 NOTE — Telephone Encounter (Signed)
 Bonnie Lowe has been internally referred to [facility].  They will reach out to her to schedule.  When I called to follow up for another patient, they did inform me they were about 3 weeks behind.  I will keep an eye on this referral.  If it goes too long without being touched, I will refer her somewhere else.

## 2023-10-18 ENCOUNTER — Encounter (INDEPENDENT_AMBULATORY_CARE_PROVIDER_SITE_OTHER): Payer: Self-pay | Admitting: Otolaryngology

## 2023-10-18 DIAGNOSIS — J3089 Other allergic rhinitis: Secondary | ICD-10-CM | POA: Diagnosis not present

## 2023-10-18 NOTE — Progress Notes (Signed)
 VIALS MADE 10-18-23

## 2023-10-19 DIAGNOSIS — J302 Other seasonal allergic rhinitis: Secondary | ICD-10-CM | POA: Diagnosis not present

## 2023-10-27 LAB — ALLERGENS W/TOTAL IGE AREA 2
Alternaria Alternata IgE: <0.1 kU/L
Aspergillus Fumigatus IgE: <0.1 kU/L
Bermuda Grass IgE: <0.1 kU/L
Cat Dander IgE: <0.1 kU/L
Cedar, Mountain IgE: <0.1 kU/L
Cladosporium Herbarum IgE: <0.1 kU/L
Cockroach, German IgE: <0.1 kU/L
Common Silver Birch IgE: <0.1 kU/L
Cottonwood IgE: <0.1 kU/L
D Farinae IgE: 0.25 kU/L — AB
D Pteronyssinus IgE: 0.19 kU/L — AB
Dog Dander IgE: <0.1 kU/L
Elm, American IgE: 0.1 kU/L — AB
IgE (Immunoglobulin E), Serum: 105 [IU]/mL (ref 6–495)
Johnson Grass IgE: <0.1 kU/L
Maple/Box Elder IgE: <0.1 kU/L
Mouse Urine IgE: <0.1 kU/L
Oak, White IgE: <0.1 kU/L
Pecan, Hickory IgE: <0.1 kU/L
Penicillium Chrysogen IgE: <0.1 kU/L
Pigweed, Rough IgE: <0.1 kU/L
Ragweed, Short IgE: 0.18 kU/L — AB
Sheep Sorrel IgE Qn: 0.12 kU/L — AB
Timothy Grass IgE: <0.1 kU/L
White Mulberry IgE: <0.1 kU/L

## 2023-10-27 LAB — STREP PNEUMONIAE 23 SEROTYPES IGG
Pneumo Ab Type 1*: >17.4 ug/mL (ref >1.3)
Pneumo Ab Type 12 (12F)*: 0.4 ug/mL — ABNORMAL LOW (ref >1.3)
Pneumo Ab Type 14*: 0.6 ug/mL — ABNORMAL LOW (ref >1.3)
Pneumo Ab Type 17 (17F)*: 27.4 ug/mL (ref >1.3)
Pneumo Ab Type 19 (19F)*: 2.9 ug/mL (ref >1.3)
Pneumo Ab Type 2*: 5.1 ug/mL (ref >1.3)
Pneumo Ab Type 20*: 6.3 ug/mL (ref >1.3)
Pneumo Ab Type 22 (22F)*: 2 ug/mL (ref >1.3)
Pneumo Ab Type 23 (23F)*: 0.8 ug/mL — ABNORMAL LOW (ref >1.3)
Pneumo Ab Type 26 (6B)*: 3.9 ug/mL (ref >1.3)
Pneumo Ab Type 3*: 0.2 ug/mL — ABNORMAL LOW (ref >1.3)
Pneumo Ab Type 34 (10A)*: 34.2 ug/mL (ref >1.3)
Pneumo Ab Type 4*: 0.3 ug/mL — ABNORMAL LOW (ref >1.3)
Pneumo Ab Type 43 (11A)*: 0.4 ug/mL — ABNORMAL LOW (ref >1.3)
Pneumo Ab Type 5*: 1.6 ug/mL (ref >1.3)
Pneumo Ab Type 51 (7F)*: 1.4 ug/mL (ref >1.3)
Pneumo Ab Type 54 (15B)*: 5.5 ug/mL (ref >1.3)
Pneumo Ab Type 56 (18C)*: 2.4 ug/mL (ref >1.3)
Pneumo Ab Type 57 (19A)*: 7.2 ug/mL (ref >1.3)
Pneumo Ab Type 68 (9V)*: 1.6 ug/mL (ref >1.3)
Pneumo Ab Type 70 (33F)*: 9.2 ug/mL (ref >1.3)
Pneumo Ab Type 8*: 12.9 ug/mL (ref >1.3)
Pneumo Ab Type 9 (9N)*: 1.2 ug/mL — ABNORMAL LOW (ref >1.3)

## 2023-10-29 ENCOUNTER — Ambulatory Visit: Payer: Self-pay | Admitting: Family

## 2023-10-29 NOTE — Progress Notes (Signed)
 Thank you Dr. Cornel Diesel  Please let [place] know that Dr. Cornel Diesel and I reviewed her lab results and that she had an adequate response to the Pneumovax vaccine. Please keep your follow up appointment with Dr. Cornel Diesel on 01/11/24 @ 10 AM with Dr. Cornel Diesel.

## 2023-10-29 NOTE — Progress Notes (Signed)
 Here are the results for the strep pneumoniae 23 serotypes that you ordered.

## 2023-10-31 NOTE — Progress Notes (Signed)
 Sending to Dr. Cornel Diesel as she has seen her the past couple times.

## 2023-10-31 NOTE — Progress Notes (Signed)
 She needs to return to follow-up to discuss next steps regarding her cough. Have her schedule something sooner than August. Would want to know what her sypmtoms were, why she was given antibiotics to consider asthma biologics.

## 2023-11-15 ENCOUNTER — Ambulatory Visit: Payer: Self-pay

## 2023-11-15 ENCOUNTER — Encounter: Payer: Self-pay | Admitting: Internal Medicine

## 2023-11-15 ENCOUNTER — Ambulatory Visit: Admitting: Internal Medicine

## 2023-11-15 VITALS — HR 86 | Temp 98.3°F | Resp 20 | Wt 183.3 lb

## 2023-11-15 DIAGNOSIS — K219 Gastro-esophageal reflux disease without esophagitis: Secondary | ICD-10-CM

## 2023-11-15 DIAGNOSIS — J302 Other seasonal allergic rhinitis: Secondary | ICD-10-CM | POA: Diagnosis not present

## 2023-11-15 DIAGNOSIS — J309 Allergic rhinitis, unspecified: Secondary | ICD-10-CM

## 2023-11-15 DIAGNOSIS — J3089 Other allergic rhinitis: Secondary | ICD-10-CM

## 2023-11-15 DIAGNOSIS — B999 Unspecified infectious disease: Secondary | ICD-10-CM

## 2023-11-15 DIAGNOSIS — J4540 Moderate persistent asthma, uncomplicated: Secondary | ICD-10-CM | POA: Diagnosis not present

## 2023-11-15 DIAGNOSIS — H1013 Acute atopic conjunctivitis, bilateral: Secondary | ICD-10-CM

## 2023-11-15 MED ORDER — OMEPRAZOLE 40 MG PO CPDR: 40.0000 mg | DELAYED_RELEASE_CAPSULE | Freq: Every day | ORAL | 3 refills | Status: AC

## 2023-11-15 MED ORDER — AZELASTINE-FLUTICASONE 137-50 MCG/ACT NA SUSP: NASAL | 5 refills | Status: AC

## 2023-11-15 MED ORDER — CETIRIZINE HCL 10 MG PO TABS: 10.0000 mg | ORAL_TABLET | Freq: Every day | ORAL | 5 refills | Status: AC | PRN

## 2023-11-15 MED ORDER — ALBUTEROL SULFATE (2.5 MG/3ML) 0.083% IN NEBU: 2.5000 mg | INHALATION_SOLUTION | RESPIRATORY_TRACT | 1 refills | Status: AC | PRN

## 2023-11-15 MED ORDER — ALBUTEROL SULFATE HFA 108 (90 BASE) MCG/ACT IN AERS: INHALATION_SPRAY | RESPIRATORY_TRACT | 1 refills | Status: DC

## 2023-11-15 MED ORDER — MONTELUKAST SODIUM 10 MG PO TABS: 10.0000 mg | ORAL_TABLET | Freq: Every day | ORAL | 1 refills | Status: AC

## 2023-11-15 MED ORDER — BUDESONIDE-FORMOTEROL FUMARATE 160-4.5 MCG/ACT IN AERO: 2.0000 | INHALATION_SPRAY | Freq: Two times a day (BID) | RESPIRATORY_TRACT | 5 refills | Status: AC

## 2023-11-15 MED ORDER — SPIRIVA RESPIMAT 1.25 MCG/ACT IN AERS: 2.0000 | INHALATION_SPRAY | Freq: Every day | RESPIRATORY_TRACT | 5 refills | Status: AC

## 2023-11-15 MED ORDER — IPRATROPIUM BROMIDE 0.03 % NA SOLN: NASAL | 2 refills | Status: AC

## 2023-11-15 NOTE — Patient Instructions (Addendum)
 Asthma- Continue montelukast  10 mg once a day to prevent cough or wheeze Continue Symbicort  160/4.5 mcg-2 puffs  twice a day with a spacer to prevent cough or wheeze.  Make sure and use this medication every day. Rinse mouth out after Continue Spiriva  2 puffs daily Continue albuterol  2 puffs every 4 hours as needed for cough or wheeze OR Instead use albuterol  0.083% solution via nebulizer one unit vial every 4 hours as needed for cough or wheeze During respiratory infections/asthma flares: add pulmicort  0.5 mg twice daily via nebulizer followed by albuterol  1 vial twice daily via nebulizer for 1-2 weeks or until symptoms controlled. Asthma control goals:  Full participation in all desired activities (may need albuterol  before activity) Albuterol  use two time or less a week on average (not counting use with activity) Cough interfering with sleep two time or less a month Oral steroids no more than once a year No hospitalizations Consider [place].  Allergic rhinitis/allergic conjunctivitis  Continue allergen avoidance measures directed toward mold, weed pollen, tree pollen, dust mite, and dog as listed below Continue allergen immunotherapy and have access to an epinephrine  autoinjector set per protocol. Continue azelastine  nasal spray 2 sprays in each nostril up to twice a day as needed for nasal symptoms Continue ipratropium 2 sprays in each nostril up to twice a day as needed for a runny nose Continue cetirizine  10 mg once a day as needed for runny nose Consider saline nasal rinses as needed for nasal symptoms. Use this before any medicated nasal sprays for best result Labs for environmental allergies 10/12/23 overall unimpressive, borderline to dust mites, has been on AIT for over 20 years. Will move to every 4 weeks (faster schedule) Follow-up with ENT as planned.  Recurrent infections:  -repeat strep titers adequate -continue to monitor frequency of infections and tack need for  antibiotics.  Reflux Continue dietary and lifestyle modifications as listed below Continue omeprazole  40 mg daily once a day to control reflux.  Make sure and take this education every day. Reach out to [facility]   Follow up : 4 months, sooner if needed It was a pleasure seeing you again in clinic today! Thank you for allowing me to participate in your care.  Jonathon Neighbors, MD [facility]    Reducing Pollen Exposure The American Academy of Allergy, Asthma and Immunology suggests the following steps to reduce your exposure to pollen during allergy seasons. Do not hang sheets or clothing out to dry; pollen may collect on these items. Do not mow lawns or spend time around freshly cut grass; mowing stirs up pollen. Keep windows closed at night.  Keep car windows closed while driving. Minimize morning activities outdoors, a time when pollen counts are usually at their highest. Stay indoors as much as possible when pollen counts or humidity is high and on windy days when pollen tends to remain in the air longer. Use air conditioning when possible.  Many air conditioners have filters that trap the pollen spores. Use a HEPA room air filter to remove pollen form the indoor air you breathe.  Control of Mold Allergen Mold and fungi can grow on a variety of surfaces provided certain temperature and moisture conditions exist.  Outdoor molds grow on plants, decaying vegetation and soil.  The major outdoor mold, Alternaria and Cladosporium, are found in very high numbers during hot and dry conditions.  Generally, a late Summer - Fall peak is seen for common outdoor fungal spores.  Rain will  temporarily lower outdoor mold spore count, but counts rise rapidly when the rainy period ends.  The most important indoor molds are Aspergillus and Penicillium.  Dark, humid and poorly ventilated basements are ideal sites for mold growth.  The next most common sites of mold growth are the bathroom and  the kitchen.  Outdoor [employer] Use air conditioning and keep windows closed Avoid exposure to decaying vegetation. Avoid leaf raking. Avoid grain handling. Consider wearing a face mask if working in moldy areas.  Indoor Mold Control Maintain humidity below 50%. Clean washable surfaces with 5% bleach solution. Remove sources e.g. Contaminated carpets.   Control of Dust Mite Allergen Dust mites play a major role in allergic asthma and rhinitis. They occur in environments with high humidity wherever human skin is found. Dust mites absorb humidity from the atmosphere (ie, they do not drink) and feed on organic matter (including shed human and animal skin). Dust mites are a microscopic type of insect that you cannot see with the naked eye. High levels of dust mites have been detected from mattresses, pillows, carpets, upholstered furniture, bed covers, clothes, soft toys and any woven material. The principal allergen of the dust mite is found in its feces. A gram of dust may contain 1,000 mites and 250,000 fecal particles. Mite antigen is easily measured in the air during house cleaning activities. Dust mites do not bite and do not cause harm to humans, other than by triggering allergies/asthma.  Ways to decrease your exposure to dust mites in your home:  1. Encase mattresses, box springs and pillows with a mite-impermeable barrier or cover  2. Wash sheets, blankets and drapes weekly in hot water (130 F) with detergent and dry them in a dryer on the hot setting.  3. Have the room cleaned frequently with a vacuum cleaner and a damp dust-mop. For carpeting or rugs, vacuuming with a vacuum cleaner equipped with a high-efficiency particulate air (HEPA) filter. The dust mite allergic individual should not be in a room which is being cleaned and should wait 1 hour after cleaning before going into the room.  4. Do not sleep on upholstered furniture (eg, couches).  5. If possible removing  carpeting, upholstered furniture and drapery from the home is ideal. Horizontal blinds should be eliminated in the rooms where the person spends the most time (bedroom, study, television room). Washable vinyl, roller-type shades are optimal.  6. Remove all non-washable stuffed toys from the bedroom. Wash stuffed toys weekly like sheets and blankets above.  7. Reduce indoor humidity to less than 50%. Inexpensive humidity monitors can be purchased at most hardware stores. Do not use a humidifier as can make the problem worse and are not recommended.  Control of Dog or Cat Allergen Avoidance is the best way to manage a dog or cat allergy. If you have a dog or cat and are allergic to dog or cats, consider removing the dog or cat from the home. If you have a dog or cat but don't want to find it a new home, or if your family wants a pet even though someone in the household is allergic, here are some strategies that may help keep symptoms at bay:  Keep the pet out of your bedroom and restrict it to only a few rooms. Be advised that keeping the dog or cat in only one room will not limit the allergens to that room. Don't pet, hug or kiss the dog or cat; if you do, wash your hands  with soap and water. High-efficiency particulate air (HEPA) cleaners run continuously in a bedroom or living room can reduce allergen levels over time. Regular use of a high-efficiency vacuum cleaner or a central vacuum can reduce allergen levels. Giving your dog or cat a bath at least once a week can reduce airborne allergen.

## 2023-11-15 NOTE — Progress Notes (Signed)
 FOLLOW UP Date of Service/Encounter:  11/15/23  Subjective:  Bonnie Lowe (DOB: [birth year]) is a 53 y.o. female who [facility] in re-evaluation of the following: asthma, allergic rhinitis, recurrent sinus infections, acid reflux  History obtained from: chart review and patient.  For Review, LV was on 10/12/23  with Dr.Jospeh Mangel seen for routine follow-up. See below for summary of history and diagnostics.  Therapeutic plans/changes recommended: we treated her for a sinus infection, her asthma is also flared so we discussed considering injectable asthma medications.  We were unable to obtain a CBC DIF needed to phenotype her asthma due to recent steroid use.  We did update her pneumonia titers which were adequate.  We also updated her environmental allergy testing since she has been on allergy injections for many years.  Her lab work for that showed very minimal dust mite elm ragweed and sheep Sorrell below the 0.35 threshold. ----------------------------------------------------- Pertinent History/Diagnostics:  Chronic cough/asthma: she reported chronic cough with yellow sputum not improved from z-pack given at [facility]. Reported cough coming from throat. Asthma current plan: symbicort  160, spiriva  2024 CXR: Minimal left basilar scar or atelectasis. No acute cardiopulmonary disease. Distended stomach beneath the left hemidiaphragm with a air-fluid level. Please correlate for any symptoms or additional evaluation as clinically appropriate" -2024: AEC 100 -CT CHEST 07/11/23: Left ninth rib fracture without complicating factors. No other focal abnormality is noted. Aortic Atherosclerosis -CT sinus 07/11/23 1. Small amount of layering fluid in the right maxillary sinus without associated mucosal thickening. 2. Single opacified posterior ethmoid air cell on the right. 3. Nasal septum bows 3 mm towards the left. Referred to ENT. Scheduled for January 28, 2024. Course  of systemic steroids: 2 times in 2025. Recurrent infections:  Immune lab work from 05/31/23 shows: CBC with differential, complement total, diphtheria and tetanus titers all normal. IgG and IgM normal. IgA slightly decreased. Strep pneumonia titers protective to 10/23. Repeat strep titers were improved (~50% response to strep titers) 45 rounds of antibiotics since November 2024. Allergic Rhinitis: Has ARC on AIT, current meds: azelastine  nasal spray, ipatropim nasal spray and mucinex PRN along with Xlear nasal spray 2021 she had testing positive to dust mites with IDs positive to ragweed mix, weed mix, tree mix, mold mix 2 and dog.  One of her vials contains mold. The other contains weed, tree, dust mite and dog  she has been on maintenance since starting at our office in 2020 and was previously on allergy injections before that at another office for over 10 years.  Reflux: on 40 mg omprazole daily, hasn't followed up with GI in a while-referred previously. Other: acquired hypothyroidism --------------------------------------------------- Today presents for follow-up. Discussed the use of AI scribe software for clinical note transcription with the patient, who gave verbal consent to proceed.  History of Present Illness   Bonnie Lowe is a 53 year old female with asthma who presents with respiratory symptoms and medication management.  She experiences respiratory symptoms, including coughing and chest tightness, which she attributes to her asthma. These symptoms were particularly pronounced during a recent episode in May, where she felt tightness in her chest.  She was diagnosed and treated for bronchitis while on a work travel event.  She uses her rescue inhaler but sometimes forgets its purpose. She gets winded easily, especially in warm weather or during physical activity, such as walking uphill.  She has been on multiple courses of antibiotics and steroids since October, including three  rounds of antibiotics, one steroid shot, and one or two steroid pills. Her current inhaler regimen includes two puffs of a red inhaler twice daily (confirmed Symbicort  160)  and Spiriva , one or two puffs once daily. She also uses two nasal sprays daily.  Her past medical history includes a deviated septum, and she is scheduled to see an ear, nose, and throat specialist in August. She has undergone allergy testing, which showed borderline results for dust mites but no significant allergies. She has been on allergy shots for approximately twenty years, with her last shot being in April.  She experiences reflux symptoms, which she describes as similar to her grandmother's, despite taking omeprazole . She mentions having drainage issues.     Chart review:  She called in May letting us  know that she was out of town and prescribed antibiotics for coughing.   All medications reviewed by clinical staff and updated in chart. No new pertinent medical or surgical history except as noted in HPI.  ROS: All others negative except as noted per HPI.   Objective:  Pulse 86   Temp 98.3 F (36.8 C) (Oral)   Resp 20   Wt 183 lb 4.8 oz (83.1 kg)   SpO2 97%   BMI 30.50 kg/m  Body mass index is 30.5 kg/m. Physical Exam: General Appearance:  Alert, cooperative, no distress, appears stated age  Head:  Normocephalic, without obvious abnormality, atraumatic  Eyes:  Conjunctiva clear, EOM's intact  Ears EACs normal bilaterally and normal TMs bilaterally  Nose: Nares normal, hypertrophic turbinates, normal mucosa, and no visible anterior polyps  Throat: Lips, tongue normal; teeth and gums normal, normal posterior oropharynx  Neck: Supple, symmetrical  Lungs:   clear to auscultation bilaterally, Respirations unlabored, no coughing  Heart:  regular rate and rhythm and no murmur, Appears well perfused  Extremities: No edema  Skin: Skin color, texture, turgor normal and no rashes or lesions on visualized portions  of skin  Neurologic: No gross deficits   Labs:  Lab Orders         CBC with Differential/Platelet      Assessment/Plan   Asthma-not currently at goal Continue montelukast  10 mg once a day to prevent cough or wheeze Continue Symbicort  160/4.5 mcg-2 puffs  twice a day with a spacer to prevent cough or wheeze.  Make sure and use this medication every day. Rinse mouth out after Continue Spiriva  2 puffs daily Continue albuterol  2 puffs every 4 hours as needed for cough or wheeze OR Instead use albuterol  0.083% solution via nebulizer one unit vial every 4 hours as needed for cough or wheeze During respiratory infections/asthma flares: add pulmicort  0.5 mg twice daily via nebulizer followed by albuterol  1 vial twice daily via nebulizer for 1-2 weeks or until symptoms controlled. Asthma control goals:  Full participation in all desired activities (may need albuterol  before activity) Albuterol  use two time or less a week on average (not counting use with activity) Cough interfering with sleep two time or less a month Oral steroids no more than once a year No hospitalizations Consider [place].  Allergic rhinitis/allergic conjunctivitis - at goal Continue allergen avoidance measures directed toward mold, weed pollen, tree pollen, dust mite, and dog as listed below Continue allergen immunotherapy and have access to an epinephrine  autoinjector set per protocol. Continue azelastine  nasal spray 2 sprays in each nostril up to twice a day as needed for nasal symptoms Continue ipratropium 2 sprays in each nostril up to twice a day as needed  for a runny nose Continue cetirizine  10 mg once a day as needed for runny nose Consider saline nasal rinses as needed for nasal symptoms. Use this before any medicated nasal sprays for best result Labs for environmental allergies 10/12/23 overall unimpressive, borderline to dust mites, has been on AIT for over 20 years. Will move to every 4 weeks (faster  schedule) Follow-up with ENT as planned.  Recurrent infections: not at goal -repeat strep titers adequate -continue to monitor frequency of infections and tack need for antibiotics.  Reflux-stable Continue dietary and lifestyle modifications as listed below Continue omeprazole  40 mg daily once a day to control reflux.  Make sure and take this medication every day. Reach out to [facility]   Follow up : 4 months, sooner if needed It was a pleasure seeing you again in clinic today! Thank you for allowing me to participate in your care.  Other: allergy injection given today.  Jonathon Neighbors, MD  [facility] 

## 2023-11-16 ENCOUNTER — Ambulatory Visit: Payer: Self-pay | Admitting: Internal Medicine

## 2023-11-16 LAB — CBC WITH DIFFERENTIAL/PLATELET
Basophils Absolute: 0.1 10*3/uL (ref 0.0–0.2)
Basos: 1 %
EOS (ABSOLUTE): 0.1 10*3/uL (ref 0.0–0.4)
Eos: 2 %
Hematocrit: 42.1 % (ref 34.0–46.6)
Hemoglobin: 13.6 g/dL (ref 11.1–15.9)
Immature Grans (Abs): 0 10*3/uL (ref 0.0–0.1)
Immature Granulocytes: 0 %
Lymphocytes Absolute: 2.8 10*3/uL (ref 0.7–3.1)
Lymphs: 41 %
MCH: 28.2 pg (ref 26.6–33.0)
MCHC: 32.3 g/dL (ref 31.5–35.7)
MCV: 87 fL (ref 79–97)
Monocytes Absolute: 0.5 10*3/uL (ref 0.1–0.9)
Monocytes: 7 %
Neutrophils Absolute: 3.5 10*3/uL (ref 1.4–7.0)
Neutrophils: 49 %
Platelets: 321 10*3/uL (ref 150–450)
RBC: 4.82 x10E6/uL (ref 3.77–5.28)
RDW: 13.5 % (ref 11.7–15.4)
WBC: 7 10*3/uL (ref 3.4–10.8)

## 2023-11-19 NOTE — Telephone Encounter (Signed)
 Bonnie Lowe has been scheduled with Dr. Lydia Sams for 01/28/2024 at 1:30 pm.

## 2023-12-03 ENCOUNTER — Ambulatory Visit (INDEPENDENT_AMBULATORY_CARE_PROVIDER_SITE_OTHER): Payer: Self-pay

## 2023-12-03 DIAGNOSIS — J309 Allergic rhinitis, unspecified: Secondary | ICD-10-CM

## 2023-12-13 ENCOUNTER — Other Ambulatory Visit: Payer: Self-pay

## 2023-12-13 ENCOUNTER — Ambulatory Visit (INDEPENDENT_AMBULATORY_CARE_PROVIDER_SITE_OTHER)

## 2023-12-13 DIAGNOSIS — J309 Allergic rhinitis, unspecified: Secondary | ICD-10-CM

## 2023-12-25 ENCOUNTER — Other Ambulatory Visit: Payer: Self-pay | Admitting: Internal Medicine

## 2024-01-07 ENCOUNTER — Ambulatory Visit (INDEPENDENT_AMBULATORY_CARE_PROVIDER_SITE_OTHER): Payer: Self-pay

## 2024-01-07 DIAGNOSIS — J309 Allergic rhinitis, unspecified: Secondary | ICD-10-CM

## 2024-01-11 ENCOUNTER — Ambulatory Visit: Admitting: Internal Medicine

## 2024-01-28 ENCOUNTER — Ambulatory Visit (INDEPENDENT_AMBULATORY_CARE_PROVIDER_SITE_OTHER): Admitting: Otolaryngology

## 2024-01-28 ENCOUNTER — Encounter (INDEPENDENT_AMBULATORY_CARE_PROVIDER_SITE_OTHER): Payer: Self-pay | Admitting: Otolaryngology

## 2024-01-28 VITALS — BP 147/91 | HR 82 | Ht 65.0 in | Wt 190.0 lb

## 2024-01-28 DIAGNOSIS — J343 Hypertrophy of nasal turbinates: Secondary | ICD-10-CM

## 2024-01-28 DIAGNOSIS — J342 Deviated nasal septum: Secondary | ICD-10-CM | POA: Diagnosis not present

## 2024-01-28 DIAGNOSIS — R0981 Nasal congestion: Secondary | ICD-10-CM

## 2024-01-28 DIAGNOSIS — R0982 Postnasal drip: Secondary | ICD-10-CM

## 2024-01-28 DIAGNOSIS — J0181 Other acute recurrent sinusitis: Secondary | ICD-10-CM

## 2024-01-28 DIAGNOSIS — J3489 Other specified disorders of nose and nasal sinuses: Secondary | ICD-10-CM | POA: Diagnosis not present

## 2024-01-28 NOTE — Patient Instructions (Signed)
 Bonnie Lowe Med Nasal Saline Rinse - use daily - start nasal saline rinses with NeilMed Bottle available over the counter    Nasal Saline Irrigation instructions: If you choose to make your own salt water solution, You will need: Salt (kosher, canning, or pickling salt) Baking soda Nasal irrigation bottle (i.e. Bonnie Lowe Med Sinus Rinse) Measuring spoon ( teaspoon) Distilled / boiled water   Mix solution Mix 1 teaspoon of salt, 1/2 teaspoon of baking soda and 1 cup of water into irrigation bottle ** May use saline packet instead of homemade recipe for this step if you prefer If medicine was prescribed to be mixed with solution, place this into bottle Examples 2 inches of 2% mupirocin  ointment Budesonide solution Position your head: Lean over sink (about 45 degrees) Rotate head (about 45 degrees) so that one nostril is above the other Irrigate Insert tip of irrigation bottle into upper nostril so it forms a comfortable seal Irrigate while breathing through your mouth May remove the straw from the bottle in order to irrigate the entire solution (important if medicine was added) Exhale through nose when finished and blow nose as necessary  Repeat on opposite side with other 1/2 of solution (120 mL) or remake solution if all 240 mL was used on first side Wash irrigation bottle regularly, replace every 3 months

## 2024-01-28 NOTE — Progress Notes (Signed)
 Dear Dr. Marinda, Here is my assessment for our mutual patient, Bonnie Lowe. Thank you for allowing me the opportunity to care for your patient. Please do not hesitate to contact me should you have any other questions. Sincerely, Dr. Eldora Blanch  Otolaryngology Clinic Note  HISTORY: Bonnie Lowe is a 53 y.o. female referred by Dr. Marinda for evaluation of recurrent sinusitis.   Initial visit (01/2024): Patient reports that she gets about 2 URIs a year, but she has a history of recurrent sinusitis. She gets about 3-4 sinus infections a year and anytime she has a sinus infection, she typically then gets bronchitis and eventually requires antibiotics. Symptoms during infection include eventually productive cough, bilateral facial pressure/pain (max), sometimes discolored drainage in the nose and congestion. No change in sense of smell. Gets antibiotics for them, and those help. In between episodes, she has some post nasal drip but otherwise has resolution of facial pressure and discolored drainage and cough. Right now, overall, she is doing fairly well and is at her baseline  She is currently using atrovent  and astelin -fluticasone  and PO anthistamine. They help some. She does not use rinses currently.   Denies typical AR symptoms.    She does have asthma and is on inhalers and singulair .  Allergy testing has been done (2021) - multiple positives. She has been on AIT for about 20 years, and those seem to help. No previous sinonasal surgery.   GLP-1: no AP/AC: no  Tobacco: no  PMHx: Asthma, AR, Hypothyroidism, prior HTN  RADIOGRAPHIC EVALUATION AND INDEPENDENT REVIEW OF OTHER RECORDS:: Dr. Marinda (11/15/2023): h/o asthma, AR, recurrent sinus infections and acid reflux. CT sinus done; noted recurrent infections, with multiple rounds of antibiotics since Nov 2024 - 3 rounds, two rounds of steroids, Dx: AR, Recurrent sinusitis; AIT, atrovent , PO anthistamine, nasal rinses, ref to ENT Allergy  testing (2021): positive to dust mites with IDs + to ragweed, weed, tree, mold CT Sinus (07/11/2023) independently interpreted: right septal deviation, right mucosal thickening which is modest with right posterior ethmoid air cell opacified.  CBC (11/15/2023), Strep Titers, RAST (10/11/2023): WBC 7.0, Eos 100; strep with modestly adequate response; IgE 105; IgG/A/M 05/31/2023: IgA low, IgG and M wnl  Past Medical History:  Diagnosis Date   Angio-edema    Asthma    Hypertension    Past Surgical History:  Procedure Laterality Date   ANAL RECTAL MANOMETRY N/A 12/22/2020   Procedure: ANO RECTAL MANOMETRY;  Surgeon: Debby Hila, MD;  Location: [facility];  Service: Endoscopy;  Laterality: N/A;   NO PAST SURGERIES     Family History  Problem Relation Age of Onset   Allergic rhinitis Mother    Angioedema Neg Hx    Asthma Neg Hx    Atopy Neg Hx    Immunodeficiency Neg Hx    Urticaria Neg Hx    Eczema Neg Hx    Social History   Tobacco Use   Smoking status: Never   Smokeless tobacco: Never  Substance Use Topics   Alcohol use: No   Allergies  Allergen Reactions   Moxifloxacin Hcl Itching   Cefdinir  Hives and Diarrhea   Citrus Diarrhea   Erythromycin    Pineapple Other (See Comments)   Sulfa Antibiotics    Sulfur Other (See Comments)   Current Outpatient Medications  Medication Sig Dispense Refill   acyclovir (ZOVIRAX) 200 MG capsule Take by mouth.     albuterol  (PROVENTIL ) (2.5 MG/3ML) 0.083% nebulizer solution Take 3 mLs (2.5 mg total) by  nebulization every 4 (four) hours as needed for wheezing or shortness of breath. 180 mL 1   albuterol  (VENTOLIN  HFA) 108 (90 Base) MCG/ACT inhaler INHALE 2 PUFFS BY MOUTH EVERY 4 HOURS AS NEEDED 18 each 1   Azelastine -Fluticasone  137-50 MCG/ACT SUSP USE 1 SPRAY INTO EACH NOSTRIL TWICE A DAY 23 g 5   budesonide  (PULMICORT ) 0.5 MG/2ML nebulizer solution INHALE 1 VIAL VIA NEBULIZER DAILY 180 mL 1   budesonide -formoterol  (SYMBICORT ) 160-4.5  MCG/ACT inhaler Inhale 2 puffs into the lungs 2 (two) times daily. 10.2 g 5   cetirizine  (ZYRTEC ) 10 MG tablet Take 1 tablet (10 mg total) by mouth daily as needed for allergies. 30 tablet 5   EPINEPHrine  (AUVI-Q ) 0.3 mg/0.3 mL IJ SOAJ injection Inject 0.3 mg into the muscle as needed for anaphylaxis. 2 each 1   furosemide (LASIX) 40 MG tablet Take 40 mg by mouth daily.     ibuprofen (ADVIL) 800 MG tablet 2 tabs     ipratropium (ATROVENT ) 0.03 % nasal spray USE 1 TO 2 SPRAYS IN EACH NOSTRIL 2 TO 3 TIMES DAILY AS NEEDED 90 mL 2   montelukast  (SINGULAIR ) 10 MG tablet Take 1 tablet (10 mg total) by mouth at bedtime. 90 tablet 1   NON FORMULARY 2 injections week     omeprazole  (PRILOSEC) 40 MG capsule Take 1 capsule (40 mg total) by mouth daily. 30 capsule 3   Semaglutide,0.25 or 0.5MG /DOS, 2 MG/1.5ML SOPN Inject into the skin.     Tiotropium Bromide Monohydrate  (SPIRIVA  RESPIMAT) 1.25 MCG/ACT AERS Inhale 2 puffs into the lungs daily. 4 g 5   losartan (COZAAR) 50 MG tablet Take 50 mg by mouth daily. (Patient not taking: Reported on 01/28/2024)     No current facility-administered medications for this visit.   BP (!) 147/91 (BP Location: Right Arm, Patient Position: Sitting, Cuff Size: Large)   Pulse 82   Ht 5' 5 (1.651 m)   Wt 190 lb (86.2 kg)   SpO2 97%   BMI 31.62 kg/m   PHYSICAL EXAM:  BP (!) 147/91 (BP Location: Right Arm, Patient Position: Sitting, Cuff Size: Large)   Pulse 82   Ht 5' 5 (1.651 m)   Wt 190 lb (86.2 kg)   SpO2 97%   BMI 31.62 kg/m    Salient findings:  CN II-XII intact Bilateral EAC clear and TM intact with well pneumatized middle ear spaces Nose: Anterior rhinoscopy reveals septum deviates left, bilateral inferior turbinate hypertrophy.  Nasal endoscopy was indicated to better evaluate the nose and paranasal sinuses, given the patient's history and exam findings, and is detailed below. No lesions of oral cavity/oropharynx; dentition fair No obviously palpable  neck masses/lymphadenopathy/thyromegaly No respiratory distress or stridor   PROCEDURE:  Prior to initiating any procedures, risks/benefits/alternatives were explained to the patient and verbal consent obtained. Diagnostic Nasal Endoscopy Pre-procedure diagnosis: Concern for recurrent sinusitis Post-procedure diagnosis: same Indication: See pre-procedure diagnosis and physical exam above Complications: None apparent EBL: 0 mL Anesthesia: Lidocaine 4% and topical decongestant was topically sprayed in each nasal cavity  Description of Procedure:  Patient was identified. A rigid 30 degree endoscope was utilized to evaluate the sinonasal cavities, mucosa, sinus ostia and turbinates and septum.  Overall, signs of mucosal inflammation are noted - modest mucosal edema globally, mild crusting diffusely.  No mucopurulence, polyps, or masses noted.   Right Middle meatus: clear Right SE Recess: clear Left MM: clear Left SE Recess: clear  CPT CODE -- 31231 - Mod 25  ASSESSMENT:  53 y.o. with:  1. Other acute recurrent sinusitis   2. Hypertrophy of both inferior nasal turbinates   3. Nasal congestion   4. Nasal obstruction   5. Nasal septal deviation   6. Post-nasal drip    PLAN:  Noted h/o asthma and AR with multiple recurrent infections. She does have some residual disease on post-treatment CT, unfortunately with continued multiple exacerbations bilaterally. We discussed options given frequency for exacerbations -- septo/turbs, b/l FESS (max, anterior ethmoid). She would like to try rinses and see how she does.   We've discussed issues and options today.  We reviewed the nasal endoscopy images together.  The risks, benefits and alternatives were discussed and questions answered.  She has elected to proceed with:  1) Continue INCS-azelastine  BID 2) Continue atrovent  BID 3) Continue AIT 4) Daily nasal rinses 5) f/u in 3 months -- sooner as necessary. If continued exacerbations, consider  b/l FESS  See below regarding exact medications prescribed this encounter including dosages and route: No orders of the defined types were placed in this encounter.    Thank you for allowing me the opportunity to care for your patient. Please do not hesitate to contact me should you have any other questions.  Sincerely, Eldora Blanch, MD Otolaryngologist (ENT), [facility] Phone: [phone] Fax: [phone]  I have personally spent 46 minutes involved in face-to-face and non-face-to-face activities for this patient on the day of the visit.  Professional time spent excludes any procedures performed but includes the following activities, in addition to those noted in the documentation: preparing to see the patient (review of outside documentation and results), performing a medically appropriate examination, counseling, documenting in the electronic health record, independently interpreting results (CT).   01/28/2024, 4:52 PM

## 2024-02-04 ENCOUNTER — Encounter: Payer: Self-pay | Admitting: Internal Medicine

## 2024-02-04 ENCOUNTER — Ambulatory Visit (INDEPENDENT_AMBULATORY_CARE_PROVIDER_SITE_OTHER): Admitting: Internal Medicine

## 2024-02-04 VITALS — BP 109/70 | HR 93 | Temp 97.0°F | Resp 20 | Ht 65.0 in | Wt 191.0 lb

## 2024-02-04 DIAGNOSIS — J302 Other seasonal allergic rhinitis: Secondary | ICD-10-CM | POA: Diagnosis not present

## 2024-02-04 DIAGNOSIS — J4540 Moderate persistent asthma, uncomplicated: Secondary | ICD-10-CM

## 2024-02-04 DIAGNOSIS — B999 Unspecified infectious disease: Secondary | ICD-10-CM

## 2024-02-04 DIAGNOSIS — K219 Gastro-esophageal reflux disease without esophagitis: Secondary | ICD-10-CM | POA: Diagnosis not present

## 2024-02-04 DIAGNOSIS — J3089 Other allergic rhinitis: Secondary | ICD-10-CM | POA: Diagnosis not present

## 2024-02-04 DIAGNOSIS — J342 Deviated nasal septum: Secondary | ICD-10-CM

## 2024-02-04 NOTE — Progress Notes (Signed)
 FOLLOW UP Date of Service/Encounter:  02/04/24  Subjective:  Bonnie Lowe (DOB: 06-10-1971) is a 53 y.o. female who [facility] in re-evaluation of the following: asthma, allergic rhinitis, recurrent sinus infections, acid reflux  History obtained from: chart review and patient.  For Review, LV was on 12/05/23  with Dr.Dennis seen for routine follow-up. See below for summary of history and diagnostics.  Therapeutic plans/changes recommended:  switch maintenance interval to 4 weeks, asthma, rhinitis stable no medication changes, Reflux not controlled and recommended she reach out to [facility]  ----------------------------------------------------- Pertinent History/Diagnostics:  Chronic cough/asthma: she reported chronic cough with yellow sputum not improved from z-pack given at [facility]. Reported cough coming from throat. Asthma current plan: symbicort  160, spiriva  2024 CXR: Minimal left basilar scar or atelectasis. No acute cardiopulmonary disease. Distended stomach beneath the left hemidiaphragm with a air-fluid level. Please correlate for any symptoms or additional evaluation as clinically appropriate -2024: AEC 100 -CT CHEST 07/11/23: Left ninth rib fracture without complicating factors. No other focal abnormality is noted. Aortic Atherosclerosis -CT sinus 07/11/23 1. Small amount of layering fluid in the right maxillary sinus without associated mucosal thickening. 2. Single opacified posterior ethmoid air cell on the right. 3. Nasal septum bows 3 mm towards the left. Referred to ENT. Scheduled for January 28, 2024. Course of systemic steroids: 2 times in 2025. Recurrent infections:  Immune lab work from 05/31/23 shows: CBC with differential, complement total, diphtheria and tetanus titers all normal. IgG and IgM normal. IgA slightly decreased. Strep pneumonia titers protective to 10/23. Repeat strep titers were improved (~50% response to strep  titers) 45 rounds of antibiotics since November 2024. Allergic Rhinitis: Has ARC on AIT, current meds: azelastine  nasal spray, ipatropim nasal spray and mucinex PRN along with Xlear nasal spray 2021 she had testing positive to dust mites with IDs positive to ragweed mix, weed mix, tree mix, mold mix 2 and dog.  One of her vials contains mold. The other contains weed, tree, dust mite and dog  she has been on maintenance since starting at our office in 2020 and was previously on allergy injections before that at another office for over 10 years.  4 week maintenance interval (11/2023)  Reflux: on 40 mg omprazole daily, hasn't followed up with GI in a while-referred previously. Other: acquired hypothyroidism --------------------------------------------------- Today presents for follow-up. Discussed the use of AI scribe software for clinical note transcription with the patient, who gave verbal consent to proceed.  History of Present Illness    Discussed the use of AI scribe software for clinical note transcription with the patient, who gave verbal consent to proceed.  History of Present Illness Bonnie Lowe is a 53 year old female with asthma who presents with increased cough.  Cough and lower respiratory symptoms - Increased cough onset Wednesday night or Thursday morning of last week - Cough is deep, chest-based, and productive of phlegm - Cough associated with some chest pain - Symptoms resolved  with nebulizer treatment with pulmicort  and albuterol  over the weekend - Has not started Tezspire due to wanting to purchase of life insurance first. - Recent travel to [place], possible contributing factor  Upper respiratory and sinus symptoms - History of sinus issues, including deviated septum and sinus drainage problems - Cough typically manifests in chest rather than sinuses during illness - Advised to begin daily saline rinses for sinus management - No recent infections since last  visit  Allergen immunotherapy -  Currently receiving allergy injections every four weeks  Other medical history - Colonoscopy this summer showed no significant findings except for a possible hemorrhoid  Did see ENT.  Plan to do 3 months of sinus rinses first before attempting surgery for deviated septum and possible fess.  All medications reviewed by clinical staff and updated in chart. No new pertinent medical or surgical history except as noted in HPI.  ROS: All others negative except as noted per HPI.   Objective:  BP 109/70 (BP Location: Right Arm, Patient Position: Sitting)   Pulse 93   Temp (!) 97 F (36.1 C) (Temporal)   Resp 20   Ht 5' 5 (1.651 m)   Wt 191 lb (86.6 kg)   SpO2 96%   BMI 31.78 kg/m  Body mass index is 31.78 kg/m. Physical Exam: General Appearance:  Alert, cooperative, no distress, appears stated age  Head:  Normocephalic, without obvious abnormality, atraumatic  Eyes:  Conjunctiva clear, EOM's intact  Ears EACs normal bilaterally and normal TMs bilaterally  Nose: Nares normal, septum mildly deviated, green rhinorrhea , hypertrophic turbinates, normal mucosa, and no visible anterior polyps  Throat: Lips, tongue normal; teeth and gums normal, normal posterior oropharynx  Neck: Supple, symmetrical  Lungs:   clear to auscultation bilaterally, Respirations unlabored, no coughing  Heart:  regular rate and rhythm and no murmur, Appears well perfused  Extremities: No edema  Skin: Skin color, texture, turgor normal and no rashes or lesions on visualized portions of skin  Neurologic: No gross deficits   Labs:  Lab Orders  No laboratory test(s) ordered today     Assessment/Plan  Asthma- mild increase in symptoms over the weekend, responded to asthma action plan  Continue montelukast  10 mg once a day to prevent cough or wheeze Continue Symbicort  160/4.5 mcg-2 puffs  twice a day with a spacer to prevent cough or wheeze.  Make sure and use this medication  every day. Rinse mouth out after Continue Spiriva  2 puffs daily Continue albuterol  2 puffs every 4 hours as needed for cough or wheeze OR Instead use albuterol  0.083% solution via nebulizer one unit vial every 4 hours as needed for cough or wheeze During respiratory infections/asthma flares: add pulmicort  0.5 mg twice daily via nebulizer followed by albuterol  1 vial twice daily via nebulizer for 1-2 weeks or until symptoms controlled. Asthma control goals:  Full participation in all desired activities (may need albuterol  before activity) Albuterol  use two time or less a week on average (not counting use with activity) Cough interfering with sleep two time or less a month Oral steroids no more than once a year No hospitalizations Consider Tezspire.  Allergic rhinitis/allergic conjunctivitis  Continue allergen avoidance measures directed toward mold, weed pollen, tree pollen, dust mite, and dog as listed below Continue allergen immunotherapy and have access to an epinephrine  autoinjector set per protocol. Continue azelastine  nasal spray 2 sprays in each nostril up to twice a day as needed for nasal symptoms Continue ipratropium 2 sprays in each nostril up to twice a day as needed for a runny nose Continue cetirizine  10 mg once a day as needed for runny nose Consider saline nasal rinses as needed for nasal symptoms. Use this before any medicated nasal sprays for best result Continue Allergy injections per procotol  Continue to follow with ENT for deviated septum   Recurrent infections:  -repeat strep titers adequate -continue to monitor frequency of infections and tack need for antibiotics.  Reflux Continue dietary and lifestyle modifications  as listed below Continue omeprazole  40 mg daily once a day to control reflux.  Make sure and take this education every day. Continue to follow with [facility]   Follow up: 4 months, let us  know when you are ready to start Tezspire    Other: allergy  injection given today.  Thank you so much for letting me partake in your care today.  Don't hesitate to reach out if you have any additional concerns!  Hargis Springer, MD  [facility] [place], [place]

## 2024-02-04 NOTE — Patient Instructions (Addendum)
 Asthma- mild increase in symptoms over the weekend, responded to asthma action plan  Continue montelukast  10 mg once a day to prevent cough or wheeze Continue Symbicort  160/4.5 mcg-2 puffs  twice a day with a spacer to prevent cough or wheeze.  Make sure and use this medication every day. Rinse mouth out after Continue Spiriva  2 puffs daily Continue albuterol  2 puffs every 4 hours as needed for cough or wheeze OR Instead use albuterol  0.083% solution via nebulizer one unit vial every 4 hours as needed for cough or wheeze During respiratory infections/asthma flares: add pulmicort  0.5 mg twice daily via nebulizer followed by albuterol  1 vial twice daily via nebulizer for 1-2 weeks or until symptoms controlled. Asthma control goals:  Full participation in all desired activities (may need albuterol  before activity) Albuterol  use two time or less a week on average (not counting use with activity) Cough interfering with sleep two time or less a month Oral steroids no more than once a year No hospitalizations Consider Tezspire.  Allergic rhinitis/allergic conjunctivitis  Continue allergen avoidance measures directed toward mold, weed pollen, tree pollen, dust mite, and dog as listed below Continue allergen immunotherapy and have access to an epinephrine  autoinjector set per protocol. Continue azelastine  nasal spray 2 sprays in each nostril up to twice a day as needed for nasal symptoms Continue ipratropium 2 sprays in each nostril up to twice a day as needed for a runny nose Continue cetirizine  10 mg once a day as needed for runny nose Consider saline nasal rinses as needed for nasal symptoms. Use this before any medicated nasal sprays for best result Continue Allergy injections per procotol  Continue to follow with ENT for deviated septum   Recurrent infections:  -repeat strep titers adequate -continue to monitor frequency of infections and tack need for antibiotics.  Reflux Continue dietary  and lifestyle modifications as listed below Continue omeprazole  40 mg daily once a day to control reflux.  Make sure and take this education every day. Continue to follow with [facility]   Follow up: 4 months, let us  know when you are ready to start Tezspire   Thank you so much for letting me partake in your care today.  Don't hesitate to reach out if you have any additional concerns!  Hargis Springer, MD  [facility] [place], [place]

## 2024-02-15 ENCOUNTER — Ambulatory Visit (INDEPENDENT_AMBULATORY_CARE_PROVIDER_SITE_OTHER)

## 2024-02-15 DIAGNOSIS — J309 Allergic rhinitis, unspecified: Secondary | ICD-10-CM | POA: Diagnosis not present

## 2024-02-22 ENCOUNTER — Ambulatory Visit (INDEPENDENT_AMBULATORY_CARE_PROVIDER_SITE_OTHER)

## 2024-02-22 DIAGNOSIS — J309 Allergic rhinitis, unspecified: Secondary | ICD-10-CM | POA: Diagnosis not present

## 2024-02-27 ENCOUNTER — Ambulatory Visit: Admitting: Internal Medicine

## 2024-03-10 ENCOUNTER — Other Ambulatory Visit: Payer: Self-pay

## 2024-03-10 ENCOUNTER — Telehealth: Payer: Self-pay | Admitting: *Deleted

## 2024-03-10 ENCOUNTER — Encounter: Payer: Self-pay | Admitting: Family Medicine

## 2024-03-10 ENCOUNTER — Ambulatory Visit (INDEPENDENT_AMBULATORY_CARE_PROVIDER_SITE_OTHER): Admitting: Family Medicine

## 2024-03-10 VITALS — BP 138/80 | HR 88 | Temp 97.9°F | Resp 20 | Ht 65.0 in | Wt 188.9 lb

## 2024-03-10 DIAGNOSIS — J3089 Other allergic rhinitis: Secondary | ICD-10-CM | POA: Diagnosis not present

## 2024-03-10 DIAGNOSIS — J4540 Moderate persistent asthma, uncomplicated: Secondary | ICD-10-CM

## 2024-03-10 DIAGNOSIS — B999 Unspecified infectious disease: Secondary | ICD-10-CM | POA: Diagnosis not present

## 2024-03-10 DIAGNOSIS — J302 Other seasonal allergic rhinitis: Secondary | ICD-10-CM

## 2024-03-10 DIAGNOSIS — K219 Gastro-esophageal reflux disease without esophagitis: Secondary | ICD-10-CM

## 2024-03-10 DIAGNOSIS — B9689 Other specified bacterial agents as the cause of diseases classified elsewhere: Secondary | ICD-10-CM | POA: Insufficient documentation

## 2024-03-10 DIAGNOSIS — J019 Acute sinusitis, unspecified: Secondary | ICD-10-CM

## 2024-03-10 DIAGNOSIS — H1013 Acute atopic conjunctivitis, bilateral: Secondary | ICD-10-CM

## 2024-03-10 MED ORDER — AMOXICILLIN-POT CLAVULANATE 875-125 MG PO TABS: 1.0000 | ORAL_TABLET | Freq: Two times a day (BID) | ORAL | 0 refills | Status: AC

## 2024-03-10 NOTE — Telephone Encounter (Signed)
 Received fax request for Omeprazole  40 mg capsules. Okay to refill?

## 2024-03-10 NOTE — Telephone Encounter (Signed)
 Let's hold on this please. She reported as not taking at today's visit, and she is seeing her GI doc in a few days.

## 2024-03-10 NOTE — Telephone Encounter (Signed)
 Okay, denied request.

## 2024-03-10 NOTE — Patient Instructions (Addendum)
 Asthma- Continue montelukast  10 mg once a day to prevent cough or wheeze Continue Symbicort  160/4.5 mcg-2 puffs  twice a day with a spacer to prevent cough or wheeze.  Make sure and use this medication every day. Rinse mouth out after Continue Spiriva  2 puffs daily Continue albuterol  2 puffs every 4 hours as needed for cough or wheeze OR Instead use albuterol  0.083% solution via nebulizer one unit vial every 4 hours as needed for cough or wheeze During respiratory infections/asthma flares: add pulmicort  0.5 mg twice daily via nebulizer followed by albuterol  1 vial twice daily via nebulizer for 1-2 weeks or until symptoms controlled. Asthma control goals:  Full participation in all desired activities (may need albuterol  before activity) Albuterol  use two time or less a week on average (not counting use with activity) Cough interfering with sleep two time or less a month Oral steroids no more than once a year No hospitalizations Consider Tezspire injections for asthma control  Allergic rhinitis/allergic conjunctivitis  Continue allergen avoidance measures directed toward mold, weed pollen, tree pollen, dust mite, and dog as listed below Continue allergen immunotherapy and have access to an epinephrine  autoinjector set per protocol. Continue azelastine  nasal spray 2 sprays in each nostril up to twice a day as needed for nasal symptoms Continue ipratropium 2 sprays in each nostril up to twice a day as needed for a runny nose Continue cetirizine  10 mg once a day as needed for runny nose Consider saline nasal rinses as needed for nasal symptoms. Use this before any medicated nasal sprays for best result Labs for environmental allergies 10/12/23 overall unimpressive, borderline to dust mites, has been on AIT for over 20 years. Will move to every 4 weeks (faster schedule) Follow-up with ENT as recommended  Recurrent infections:  -a lab has been ordered to help us  evaluate your immune system  (pneumococcal avidity) -continue to monitor frequency of infections and tack need for antibiotics.  Reflux Continue dietary and lifestyle modifications as listed below Continue to follow up with your gastroenterologist  Acute bacterial sinusitis Continue saline nasal rinses at least daily Begin Mucinex [phone] mg twice a day and increase fluid intake as possible If no improvement or your symptoms worsen after Wednesday, begin Augmentin  as prescribed  Call the clinic if this treatment plan is not working well for you  Follow up in 2 months sooner if needed.  Reducing Pollen Exposure The American Academy of Allergy, Asthma and Immunology suggests the following steps to reduce your exposure to pollen during allergy seasons. Do not hang sheets or clothing out to dry; pollen may collect on these items. Do not mow lawns or spend time around freshly cut grass; mowing stirs up pollen. Keep windows closed at night.  Keep car windows closed while driving. Minimize morning activities outdoors, a time when pollen counts are usually at their highest. Stay indoors as much as possible when pollen counts or humidity is high and on windy days when pollen tends to remain in the air longer. Use air conditioning when possible.  Many air conditioners have filters that trap the pollen spores. Use a HEPA room air filter to remove pollen form the indoor air you breathe.  Control of Mold Allergen Mold and fungi can grow on a variety of surfaces provided certain temperature and moisture conditions exist.  Outdoor molds grow on plants, decaying vegetation and soil.  The major outdoor mold, Alternaria and Cladosporium, are found in very high numbers during hot and dry conditions.  Generally, a  late Summer - Fall peak is seen for common outdoor fungal spores.  Rain will temporarily lower outdoor mold spore count, but counts rise rapidly when the rainy period ends.  The most important indoor molds are Aspergillus and  Penicillium.  Dark, humid and poorly ventilated basements are ideal sites for mold growth.  The next most common sites of mold growth are the bathroom and the kitchen.  Outdoor [employer] Use air conditioning and keep windows closed Avoid exposure to decaying vegetation. Avoid leaf raking. Avoid grain handling. Consider wearing a face mask if working in moldy areas.  Indoor Mold Control Maintain humidity below 50%. Clean washable surfaces with 5% bleach solution. Remove sources e.g. Contaminated carpets.   Control of Dust Mite Allergen Dust mites play a major role in allergic asthma and rhinitis. They occur in environments with high humidity wherever human skin is found. Dust mites absorb humidity from the atmosphere (ie, they do not drink) and feed on organic matter (including shed human and animal skin). Dust mites are a microscopic type of insect that you cannot see with the naked eye. High levels of dust mites have been detected from mattresses, pillows, carpets, upholstered furniture, bed covers, clothes, soft toys and any woven material. The principal allergen of the dust mite is found in its feces. A gram of dust may contain 1,000 mites and 250,000 fecal particles. Mite antigen is easily measured in the air during house cleaning activities. Dust mites do not bite and do not cause harm to humans, other than by triggering allergies/asthma.  Ways to decrease your exposure to dust mites in your home:  1. Encase mattresses, box springs and pillows with a mite-impermeable barrier or cover  2. Wash sheets, blankets and drapes weekly in hot water (130 F) with detergent and dry them in a dryer on the hot setting.  3. Have the room cleaned frequently with a vacuum cleaner and a damp dust-mop. For carpeting or rugs, vacuuming with a vacuum cleaner equipped with a high-efficiency particulate air (HEPA) filter. The dust mite allergic individual should not be in a room which is being cleaned and  should wait 1 hour after cleaning before going into the room.  4. Do not sleep on upholstered furniture (eg, couches).  5. If possible removing carpeting, upholstered furniture and drapery from the home is ideal. Horizontal blinds should be eliminated in the rooms where the person spends the most time (bedroom, study, television room). Washable vinyl, roller-type shades are optimal.  6. Remove all non-washable stuffed toys from the bedroom. Wash stuffed toys weekly like sheets and blankets above.  7. Reduce indoor humidity to less than 50%. Inexpensive humidity monitors can be purchased at most hardware stores. Do not use a humidifier as can make the problem worse and are not recommended.  Control of Dog or Cat Allergen Avoidance is the best way to manage a dog or cat allergy. If you have a dog or cat and are allergic to dog or cats, consider removing the dog or cat from the home. If you have a dog or cat but don't want to find it a new home, or if your family wants a pet even though someone in the household is allergic, here are some strategies that may help keep symptoms at bay:  Keep the pet out of your bedroom and restrict it to only a few rooms. Be advised that keeping the dog or cat in only one room will not limit the allergens to that room.  Don't pet, hug or kiss the dog or cat; if you do, wash your hands with soap and water. High-efficiency particulate air (HEPA) cleaners run continuously in a bedroom or living room can reduce allergen levels over time. Regular use of a high-efficiency vacuum cleaner or a central vacuum can reduce allergen levels. Giving your dog or cat a bath at least once a week can reduce airborne allergen.

## 2024-03-10 NOTE — Progress Notes (Signed)
 [address] [place] [address] Dept: [phone]  FOLLOW UP NOTE  Patient ID: Bonnie Lowe, female    DOB: 09-02-1970  Age: 53 y.o. MRN: [id number] Date of Office Visit: 03/10/2024  Assessment  Chief Complaint: Nasal Congestion (Started last Wednesday, sore throat, but has progresses to pressure in face, neck. Deep cough with tightness of chest. )  HPI Bonnie Lowe is a 53 year old female who presents to the clinic for evaluation of cough, chest tightness and congestion. She was last seen in this clinic on 02/04/2024 by Dr. Lorin for evaluation of asthma, allergic rhinitis, allergic conjunctivitis, deviated septum, reflux, and recurrent infection. She began allergen immunotherapy around 2010 with an allergy clinic in [place]  and subsequently began receiving allergen immunotherapy with the same prescription in our clinic with her first injection in this clinic on 09/04/2018.   At today's visit, she reports that she began to experience sore throat, lymph node swelling, cough, chest tightness, and facial pressure under both eyes on Wednesday, 6 days ago. She reports that she took COVID tests that were negative on Wednesday and Saturday. She denies fever, sweats, chills or sick contacts.   Asthma is reported as moderately well controlled with most symptoms beginning 6 days ago including wheeze occurring during the day and night and cough producing thick yellow/green mucus. She continues Symbicort  160-2 puffs twice a day, Spiriva  1.25 mcg 2 puffs daily, and has been using albuterol  frequently with moderate relief of symptoms. She reports that she began budesonide  via nebulizer several days ago with moderate relief of symptoms. She is moderately interested in moving forward with Tezspire for asthma control over the next few months.   Allergic rhinitis is reported as well controlled until she began to experience symptoms including nasal congestion and postnasal drainage 6 days ago. She  continues a daily antihistamine, azelastine , ipratropium, and has recently started using nasal saline rinses. She continues allergen immunotherapy with no large or local reaction. She reports a significant decrease in her symptoms of allergic rhinitis while continuing on allergic rhinitis.She continues to follow up with Dr Tobie, ENT specialist with [facility].   Allergic conjunctivitis is reported as well controlled with no medical intervention at this time.   She reports an acute bacterial infection requiring several rounds of antibiotics over the spring. Immune screening on 05/31/2023 indicate slight decrease in IgG with IgA and IgM within normal limits and protective titers to diptheria and tetanus. She was protective to 9/23 pneumococcal serotypes for which she received a pneumococcal vaccine on 05/31/2023 and repeat titers indicated an adequate response.   Reflux is reported as not well controlled with frequent abdominal bloating, rectal bleeding, and reflux. She reports that she has not taken omeprazole  for the last week due to an upcoming breath test ordered by her GI provider.   Her current medications are listed in the chart.  Of note, an allergy warning appears when ordering Augmentin , however, she reports that she has taken Augmentin  several times without adverse effect.   Drug Allergies:  Allergies  Allergen Reactions   Moxifloxacin Hcl Itching   Cefdinir  Hives and Diarrhea   Citrus Diarrhea   Erythromycin    Pineapple Other (See Comments)   Sulfa Antibiotics    Sulfur Other (See Comments)    Physical Exam: BP 138/80 (BP Location: Right Arm, Patient Position: Sitting, Cuff Size: Normal)   Pulse 88   Temp 97.9 F (36.6 C) (Temporal)   Resp 20   Ht 5'  5 (1.651 m)   Wt 188 lb 14.4 oz (85.7 kg)   SpO2 96%   BMI 31.43 kg/m    Physical Exam Vitals reviewed.  Constitutional:      Appearance: Normal appearance.  HENT:     Head: Normocephalic and atraumatic.      Right Ear: Tympanic membrane normal.     Left Ear: Tympanic membrane normal.     Nose:     Comments: Bilateral nares slightly erythematous with thin clear nasal drainage noted. Pharynx slightly reddened with no exudate. Ears normal. Eyes normal. Eyes:     Conjunctiva/sclera: Conjunctivae normal.  Cardiovascular:     Rate and Rhythm: Normal rate and regular rhythm.     Heart sounds: Normal heart sounds. No murmur heard. Pulmonary:     Effort: Pulmonary effort is normal.     Breath sounds: Normal breath sounds.     Comments: Lungs clear to auscultation Musculoskeletal:        General: Normal range of motion.  Skin:    General: Skin is warm and dry.  Neurological:     Mental Status: She is alert and oriented to person, place, and time.  Psychiatric:        Mood and Affect: Mood normal.        Behavior: Behavior normal.        Thought Content: Thought content normal.        Judgment: Judgment normal.     Diagnostics: FVC 3.97 which is 116% of predicted value. FEV1 2.85 which is 105% of predicted value. Spirometry indicates normal ventilatory function.  Assessment and Plan: 1. Moderate persistent asthma without complication   2. Seasonal and perennial allergic rhinitis   3. Allergic conjunctivitis of both eyes   4. Recurrent infections   5. Gastroesophageal reflux disease, unspecified whether esophagitis present   6. Acute bacterial sinusitis     Meds ordered this encounter  Medications   amoxicillin -clavulanate (AUGMENTIN ) 875-125 MG tablet    Sig: Take 1 tablet by mouth 2 (two) times daily.    Dispense:  20 tablet    Refill:  0    Patient Instructions  Asthma- Continue montelukast  10 mg once a day to prevent cough or wheeze Continue Symbicort  160/4.5 mcg-2 puffs  twice a day with a spacer to prevent cough or wheeze.  Make sure and use this medication every day. Rinse mouth out after Continue Spiriva  2 puffs daily Continue albuterol  2 puffs every 4 hours as needed for  cough or wheeze OR Instead use albuterol  0.083% solution via nebulizer one unit vial every 4 hours as needed for cough or wheeze During respiratory infections/asthma flares: add pulmicort  0.5 mg twice daily via nebulizer followed by albuterol  1 vial twice daily via nebulizer for 1-2 weeks or until symptoms controlled. Asthma control goals:  Full participation in all desired activities (may need albuterol  before activity) Albuterol  use two time or less a week on average (not counting use with activity) Cough interfering with sleep two time or less a month Oral steroids no more than once a year No hospitalizations Consider Tezspire injections for asthma control  Allergic rhinitis/allergic conjunctivitis  Continue allergen avoidance measures directed toward mold, weed pollen, tree pollen, dust mite, and dog as listed below Continue allergen immunotherapy and have access to an epinephrine  autoinjector set per protocol. Continue azelastine  nasal spray 2 sprays in each nostril up to twice a day as needed for nasal symptoms Continue ipratropium 2 sprays in each nostril up to twice  a day as needed for a runny nose Continue cetirizine  10 mg once a day as needed for runny nose Consider saline nasal rinses as needed for nasal symptoms. Use this before any medicated nasal sprays for best result Labs for environmental allergies 10/12/23 overall unimpressive, borderline to dust mites, has been on AIT for over 20 years. Will move to every 4 weeks (faster schedule) Follow-up with ENT as recommended  Recurrent infections:  -a lab has been ordered to help us  evaluate your immune system -continue to monitor frequency of infections and tack need for antibiotics.  Reflux Continue dietary and lifestyle modifications as listed below Continue to follow up with your gastroenterologist  Acute bacterial sinusitis Continue saline nasal rinses at least daily Begin Mucinex [phone] mg twice a day and increase fluid  intake as possible If no improvement or your symptoms worsen after Wednesday, begin Augmentin  as prescribed  Call the clinic if this treatment plan is not working well for you  Follow up in 2 months sooner if needed.  Return in about 2 months (around 05/10/2024), or if symptoms worsen or fail to improve.    Thank you for the opportunity to care for this patient.  Please do not hesitate to contact me with questions.  Arlean Mutter, FNP [facility] 

## 2024-03-12 NOTE — Telephone Encounter (Signed)
 Received 2nd request from [facility]. Denying request as per previous note.

## 2024-04-03 ENCOUNTER — Ambulatory Visit (INDEPENDENT_AMBULATORY_CARE_PROVIDER_SITE_OTHER): Payer: Self-pay

## 2024-04-03 ENCOUNTER — Other Ambulatory Visit: Payer: Self-pay | Admitting: Internal Medicine

## 2024-04-03 DIAGNOSIS — J309 Allergic rhinitis, unspecified: Secondary | ICD-10-CM

## 2024-04-29 ENCOUNTER — Telehealth (INDEPENDENT_AMBULATORY_CARE_PROVIDER_SITE_OTHER): Payer: Self-pay | Admitting: Otolaryngology

## 2024-04-29 NOTE — Telephone Encounter (Signed)
 error

## 2024-05-06 ENCOUNTER — Ambulatory Visit (INDEPENDENT_AMBULATORY_CARE_PROVIDER_SITE_OTHER): Payer: Self-pay

## 2024-05-06 ENCOUNTER — Ambulatory Visit (INDEPENDENT_AMBULATORY_CARE_PROVIDER_SITE_OTHER): Admitting: Otolaryngology

## 2024-05-06 ENCOUNTER — Other Ambulatory Visit: Payer: Self-pay | Admitting: Family Medicine

## 2024-05-06 DIAGNOSIS — J309 Allergic rhinitis, unspecified: Secondary | ICD-10-CM

## 2024-05-06 DIAGNOSIS — B999 Unspecified infectious disease: Secondary | ICD-10-CM

## 2024-05-26 ENCOUNTER — Ambulatory Visit (INDEPENDENT_AMBULATORY_CARE_PROVIDER_SITE_OTHER): Admitting: Otolaryngology

## 2024-06-11 ENCOUNTER — Ambulatory Visit: Admitting: Internal Medicine

## 2024-06-11 ENCOUNTER — Ambulatory Visit (INDEPENDENT_AMBULATORY_CARE_PROVIDER_SITE_OTHER)

## 2024-06-11 DIAGNOSIS — J302 Other seasonal allergic rhinitis: Secondary | ICD-10-CM | POA: Diagnosis not present

## 2024-06-11 DIAGNOSIS — J3089 Other allergic rhinitis: Secondary | ICD-10-CM

## 2024-07-02 ENCOUNTER — Ambulatory Visit: Admitting: Internal Medicine

## 2024-07-07 ENCOUNTER — Ambulatory Visit (INDEPENDENT_AMBULATORY_CARE_PROVIDER_SITE_OTHER): Admitting: Otolaryngology

## 2024-07-15 ENCOUNTER — Ambulatory Visit (INDEPENDENT_AMBULATORY_CARE_PROVIDER_SITE_OTHER): Admitting: Otolaryngology

## 2024-07-23 ENCOUNTER — Ambulatory Visit: Admitting: Internal Medicine

## 2024-08-05 ENCOUNTER — Ambulatory Visit (INDEPENDENT_AMBULATORY_CARE_PROVIDER_SITE_OTHER): Admitting: Otolaryngology
